# Patient Record
Sex: Male | Born: 1945 | Race: White | Hispanic: No | Marital: Married | Smoking: Former smoker
Health system: Southern US, Community
[De-identification: ages and names within clinical notes are randomized; demographics above are authoritative.]

## PROBLEM LIST (undated history)

## (undated) DIAGNOSIS — G4730 Sleep apnea, unspecified: Secondary | ICD-10-CM

## (undated) DIAGNOSIS — I10 Essential (primary) hypertension: Secondary | ICD-10-CM

## (undated) DIAGNOSIS — K7030 Alcoholic cirrhosis of liver without ascites: Secondary | ICD-10-CM

## (undated) DIAGNOSIS — Z923 Personal history of irradiation: Secondary | ICD-10-CM

## (undated) DIAGNOSIS — C159 Malignant neoplasm of esophagus, unspecified: Secondary | ICD-10-CM

## (undated) DIAGNOSIS — K760 Fatty (change of) liver, not elsewhere classified: Secondary | ICD-10-CM

## (undated) DIAGNOSIS — M1990 Unspecified osteoarthritis, unspecified site: Secondary | ICD-10-CM

## (undated) DIAGNOSIS — K219 Gastro-esophageal reflux disease without esophagitis: Secondary | ICD-10-CM

## (undated) DIAGNOSIS — Z9221 Personal history of antineoplastic chemotherapy: Secondary | ICD-10-CM

## (undated) DIAGNOSIS — IMO0001 Reserved for inherently not codable concepts without codable children: Secondary | ICD-10-CM

## (undated) DIAGNOSIS — M109 Gout, unspecified: Secondary | ICD-10-CM

## (undated) DIAGNOSIS — K429 Umbilical hernia without obstruction or gangrene: Secondary | ICD-10-CM

## (undated) HISTORY — PX: HERNIA REPAIR: SHX51

## (undated) HISTORY — PX: TONSILLECTOMY: SUR1361

---

## 2000-04-03 ENCOUNTER — Ambulatory Visit (HOSPITAL_COMMUNITY): Admission: RE | Admit: 2000-04-03 | Discharge: 2000-04-03 | Payer: Self-pay | Admitting: Gastroenterology

## 2000-04-16 DIAGNOSIS — C159 Malignant neoplasm of esophagus, unspecified: Secondary | ICD-10-CM

## 2000-04-16 DIAGNOSIS — Z923 Personal history of irradiation: Secondary | ICD-10-CM

## 2000-04-16 HISTORY — PX: ESOPHAGECTOMY: SUR457

## 2000-04-16 HISTORY — DX: Personal history of irradiation: Z92.3

## 2000-04-16 HISTORY — DX: Malignant neoplasm of esophagus, unspecified: C15.9

## 2000-10-15 ENCOUNTER — Encounter: Admission: RE | Admit: 2000-10-15 | Discharge: 2000-10-15 | Payer: Self-pay | Admitting: Family Medicine

## 2000-10-15 ENCOUNTER — Encounter: Payer: Self-pay | Admitting: Family Medicine

## 2000-10-16 ENCOUNTER — Encounter (INDEPENDENT_AMBULATORY_CARE_PROVIDER_SITE_OTHER): Payer: Self-pay | Admitting: *Deleted

## 2000-10-16 ENCOUNTER — Observation Stay (HOSPITAL_COMMUNITY): Admission: RE | Admit: 2000-10-16 | Discharge: 2000-10-17 | Payer: Self-pay | Admitting: Family Medicine

## 2000-10-16 ENCOUNTER — Encounter: Payer: Self-pay | Admitting: *Deleted

## 2000-10-17 ENCOUNTER — Encounter: Payer: Self-pay | Admitting: Hematology & Oncology

## 2000-10-21 ENCOUNTER — Encounter: Admission: RE | Admit: 2000-10-21 | Discharge: 2000-10-21 | Payer: Self-pay | Admitting: Hematology & Oncology

## 2000-10-21 ENCOUNTER — Encounter: Payer: Self-pay | Admitting: Hematology & Oncology

## 2000-10-22 ENCOUNTER — Ambulatory Visit: Admission: RE | Admit: 2000-10-22 | Discharge: 2000-11-13 | Payer: Self-pay | Admitting: Radiation Oncology

## 2000-10-23 ENCOUNTER — Ambulatory Visit (HOSPITAL_COMMUNITY): Admission: RE | Admit: 2000-10-23 | Discharge: 2000-10-23 | Payer: Self-pay | Admitting: Hematology & Oncology

## 2000-10-23 ENCOUNTER — Encounter: Payer: Self-pay | Admitting: Hematology & Oncology

## 2000-10-29 ENCOUNTER — Encounter: Payer: Self-pay | Admitting: Hematology & Oncology

## 2000-10-29 ENCOUNTER — Ambulatory Visit (HOSPITAL_COMMUNITY): Admission: RE | Admit: 2000-10-29 | Discharge: 2000-10-29 | Payer: Self-pay | Admitting: Hematology & Oncology

## 2000-11-14 ENCOUNTER — Ambulatory Visit: Admission: RE | Admit: 2000-11-14 | Discharge: 2001-02-12 | Payer: Self-pay | Admitting: Radiation Oncology

## 2000-12-09 ENCOUNTER — Ambulatory Visit: Admission: RE | Admit: 2000-12-09 | Discharge: 2000-12-09 | Payer: Self-pay | Admitting: Hematology & Oncology

## 2001-01-01 ENCOUNTER — Encounter: Payer: Self-pay | Admitting: Hematology & Oncology

## 2001-01-01 ENCOUNTER — Ambulatory Visit (HOSPITAL_COMMUNITY): Admission: RE | Admit: 2001-01-01 | Discharge: 2001-01-01 | Payer: Self-pay | Admitting: Hematology & Oncology

## 2001-01-02 ENCOUNTER — Ambulatory Visit (HOSPITAL_COMMUNITY): Admission: RE | Admit: 2001-01-02 | Discharge: 2001-01-02 | Payer: Self-pay | Admitting: Hematology & Oncology

## 2001-01-02 ENCOUNTER — Encounter: Payer: Self-pay | Admitting: Hematology & Oncology

## 2001-01-10 ENCOUNTER — Ambulatory Visit (HOSPITAL_COMMUNITY): Admission: RE | Admit: 2001-01-10 | Discharge: 2001-01-10 | Payer: Self-pay | Admitting: *Deleted

## 2001-01-10 ENCOUNTER — Encounter (INDEPENDENT_AMBULATORY_CARE_PROVIDER_SITE_OTHER): Payer: Self-pay | Admitting: *Deleted

## 2001-01-13 ENCOUNTER — Encounter: Payer: Self-pay | Admitting: Cardiothoracic Surgery

## 2001-01-15 ENCOUNTER — Encounter (INDEPENDENT_AMBULATORY_CARE_PROVIDER_SITE_OTHER): Payer: Self-pay | Admitting: Specialist

## 2001-01-15 ENCOUNTER — Inpatient Hospital Stay (HOSPITAL_COMMUNITY): Admission: RE | Admit: 2001-01-15 | Discharge: 2001-01-24 | Payer: Self-pay | Admitting: Cardiothoracic Surgery

## 2001-01-15 ENCOUNTER — Encounter: Payer: Self-pay | Admitting: Cardiothoracic Surgery

## 2001-01-16 ENCOUNTER — Encounter: Payer: Self-pay | Admitting: Cardiothoracic Surgery

## 2001-01-17 ENCOUNTER — Encounter: Payer: Self-pay | Admitting: Cardiothoracic Surgery

## 2001-01-18 ENCOUNTER — Encounter: Payer: Self-pay | Admitting: Cardiothoracic Surgery

## 2001-01-19 ENCOUNTER — Encounter: Payer: Self-pay | Admitting: Cardiothoracic Surgery

## 2001-01-20 ENCOUNTER — Encounter: Payer: Self-pay | Admitting: Cardiothoracic Surgery

## 2001-01-21 ENCOUNTER — Encounter: Payer: Self-pay | Admitting: Cardiothoracic Surgery

## 2001-01-22 ENCOUNTER — Encounter: Payer: Self-pay | Admitting: Cardiothoracic Surgery

## 2001-02-06 ENCOUNTER — Encounter: Admission: RE | Admit: 2001-02-06 | Discharge: 2001-02-06 | Payer: Self-pay | Admitting: Cardiothoracic Surgery

## 2001-02-06 ENCOUNTER — Encounter: Payer: Self-pay | Admitting: Cardiothoracic Surgery

## 2001-03-18 ENCOUNTER — Ambulatory Visit (HOSPITAL_COMMUNITY): Admission: RE | Admit: 2001-03-18 | Discharge: 2001-03-18 | Payer: Self-pay | Admitting: Hematology & Oncology

## 2001-03-18 ENCOUNTER — Encounter: Payer: Self-pay | Admitting: Hematology & Oncology

## 2001-04-24 ENCOUNTER — Encounter: Payer: Self-pay | Admitting: Cardiothoracic Surgery

## 2001-04-24 ENCOUNTER — Encounter: Admission: RE | Admit: 2001-04-24 | Discharge: 2001-04-24 | Payer: Self-pay | Admitting: Cardiothoracic Surgery

## 2001-04-28 ENCOUNTER — Encounter: Payer: Self-pay | Admitting: Hematology & Oncology

## 2001-04-28 ENCOUNTER — Ambulatory Visit (HOSPITAL_COMMUNITY): Admission: RE | Admit: 2001-04-28 | Discharge: 2001-04-28 | Payer: Self-pay | Admitting: Hematology & Oncology

## 2001-06-12 ENCOUNTER — Encounter: Payer: Self-pay | Admitting: Hematology & Oncology

## 2001-06-12 ENCOUNTER — Ambulatory Visit (HOSPITAL_COMMUNITY): Admission: RE | Admit: 2001-06-12 | Discharge: 2001-06-12 | Payer: Self-pay | Admitting: Hematology & Oncology

## 2001-07-24 ENCOUNTER — Encounter: Payer: Self-pay | Admitting: Cardiothoracic Surgery

## 2001-07-24 ENCOUNTER — Encounter: Admission: RE | Admit: 2001-07-24 | Discharge: 2001-07-24 | Payer: Self-pay | Admitting: Cardiothoracic Surgery

## 2001-08-05 ENCOUNTER — Ambulatory Visit (HOSPITAL_COMMUNITY): Admission: RE | Admit: 2001-08-05 | Discharge: 2001-08-05 | Payer: Self-pay | Admitting: Hematology & Oncology

## 2001-08-05 ENCOUNTER — Encounter: Payer: Self-pay | Admitting: Hematology & Oncology

## 2001-10-09 ENCOUNTER — Ambulatory Visit (HOSPITAL_COMMUNITY): Admission: RE | Admit: 2001-10-09 | Discharge: 2001-10-09 | Payer: Self-pay | Admitting: Hematology & Oncology

## 2001-10-09 ENCOUNTER — Encounter: Payer: Self-pay | Admitting: Hematology & Oncology

## 2002-01-01 ENCOUNTER — Encounter: Payer: Self-pay | Admitting: Cardiothoracic Surgery

## 2002-01-01 ENCOUNTER — Encounter: Admission: RE | Admit: 2002-01-01 | Discharge: 2002-01-01 | Payer: Self-pay | Admitting: Cardiothoracic Surgery

## 2002-02-16 ENCOUNTER — Encounter: Payer: Self-pay | Admitting: Hematology & Oncology

## 2002-02-16 ENCOUNTER — Ambulatory Visit (HOSPITAL_COMMUNITY): Admission: RE | Admit: 2002-02-16 | Discharge: 2002-02-16 | Payer: Self-pay | Admitting: Hematology & Oncology

## 2002-04-16 HISTORY — PX: INCISIONAL HERNIA REPAIR: SHX193

## 2002-06-24 ENCOUNTER — Ambulatory Visit (HOSPITAL_COMMUNITY): Admission: RE | Admit: 2002-06-24 | Discharge: 2002-06-24 | Payer: Self-pay | Admitting: Hematology & Oncology

## 2002-06-24 ENCOUNTER — Encounter: Payer: Self-pay | Admitting: Hematology & Oncology

## 2002-10-20 ENCOUNTER — Ambulatory Visit (HOSPITAL_COMMUNITY): Admission: RE | Admit: 2002-10-20 | Discharge: 2002-10-20 | Payer: Self-pay | Admitting: Hematology & Oncology

## 2002-10-20 ENCOUNTER — Encounter: Payer: Self-pay | Admitting: Hematology & Oncology

## 2002-11-05 ENCOUNTER — Encounter: Payer: Self-pay | Admitting: Hematology & Oncology

## 2002-11-05 ENCOUNTER — Ambulatory Visit (HOSPITAL_COMMUNITY): Admission: RE | Admit: 2002-11-05 | Discharge: 2002-11-05 | Payer: Self-pay | Admitting: Hematology & Oncology

## 2003-03-09 ENCOUNTER — Ambulatory Visit (HOSPITAL_COMMUNITY): Admission: RE | Admit: 2003-03-09 | Discharge: 2003-03-09 | Payer: Self-pay | Admitting: Hematology & Oncology

## 2003-07-20 ENCOUNTER — Ambulatory Visit (HOSPITAL_COMMUNITY): Admission: RE | Admit: 2003-07-20 | Discharge: 2003-07-20 | Payer: Self-pay | Admitting: Hematology & Oncology

## 2004-01-20 ENCOUNTER — Ambulatory Visit (HOSPITAL_COMMUNITY): Admission: RE | Admit: 2004-01-20 | Discharge: 2004-01-20 | Payer: Self-pay | Admitting: Hematology & Oncology

## 2004-05-17 ENCOUNTER — Ambulatory Visit: Payer: Self-pay | Admitting: Hematology & Oncology

## 2004-06-01 ENCOUNTER — Ambulatory Visit (HOSPITAL_COMMUNITY): Admission: RE | Admit: 2004-06-01 | Discharge: 2004-06-01 | Payer: Self-pay | Admitting: Hematology & Oncology

## 2004-08-02 ENCOUNTER — Ambulatory Visit: Payer: Self-pay | Admitting: Hematology & Oncology

## 2005-01-11 ENCOUNTER — Ambulatory Visit: Payer: Self-pay | Admitting: Hematology & Oncology

## 2005-01-15 ENCOUNTER — Ambulatory Visit (HOSPITAL_COMMUNITY): Admission: RE | Admit: 2005-01-15 | Discharge: 2005-01-15 | Payer: Self-pay | Admitting: Hematology & Oncology

## 2005-08-02 ENCOUNTER — Ambulatory Visit: Payer: Self-pay | Admitting: Hematology & Oncology

## 2005-08-03 LAB — CBC WITH DIFFERENTIAL/PLATELET
BASO%: 0.6 % (ref 0.0–2.0)
EOS%: 3 % (ref 0.0–7.0)
HGB: 11.9 g/dL — ABNORMAL LOW (ref 13.0–17.1)
LYMPH%: 13.5 % — ABNORMAL LOW (ref 14.0–48.0)
MCH: 34.1 pg — ABNORMAL HIGH (ref 28.0–33.4)
MCHC: 34.8 g/dL (ref 32.0–35.9)
MCV: 98.1 fL — ABNORMAL HIGH (ref 81.6–98.0)
MONO%: 11.7 % (ref 0.0–13.0)
RBC: 3.49 10*6/uL — ABNORMAL LOW (ref 4.20–5.71)
RDW: 13.4 % (ref 11.2–14.6)
WBC: 3.2 10*3/uL — ABNORMAL LOW (ref 4.0–10.0)

## 2005-08-03 LAB — COMPREHENSIVE METABOLIC PANEL
AST: 77 U/L — ABNORMAL HIGH (ref 0–37)
Albumin: 4.5 g/dL (ref 3.5–5.2)
Alkaline Phosphatase: 79 U/L (ref 39–117)
Potassium: 5.1 mEq/L (ref 3.5–5.3)
Sodium: 135 mEq/L (ref 135–145)
Total Bilirubin: 0.6 mg/dL (ref 0.3–1.2)
Total Protein: 6.9 g/dL (ref 6.0–8.3)

## 2005-08-06 ENCOUNTER — Ambulatory Visit (HOSPITAL_COMMUNITY): Admission: RE | Admit: 2005-08-06 | Discharge: 2005-08-06 | Payer: Self-pay | Admitting: Hematology & Oncology

## 2006-10-28 ENCOUNTER — Ambulatory Visit: Payer: Self-pay | Admitting: Vascular Surgery

## 2007-01-31 ENCOUNTER — Ambulatory Visit (HOSPITAL_COMMUNITY): Admission: RE | Admit: 2007-01-31 | Discharge: 2007-01-31 | Payer: Self-pay | Admitting: Surgery

## 2007-06-03 ENCOUNTER — Encounter: Admission: RE | Admit: 2007-06-03 | Discharge: 2007-06-03 | Payer: Self-pay | Admitting: Family Medicine

## 2009-11-10 ENCOUNTER — Encounter: Admission: RE | Admit: 2009-11-10 | Discharge: 2009-11-10 | Payer: Self-pay | Admitting: Family Medicine

## 2010-08-29 NOTE — Assessment & Plan Note (Signed)
OFFICE VISIT   [name], [name]  DOB:  03/13/1946                                       10/28/2006  ONGEX#:[id number]   The patient presents today for discussion of his infrarenal abdominal  aortic aneurysm.  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  He has done extremely well since his resection.  He had, on  followup CAT scan, noted to have a 4 cm aneurysm and I am seeing him for  discussion of this and for ongoing followup.  He has no symptoms  referable to his aneurysm.   PAST MEDICAL HISTORY:  Significant for esophageal cancer, as discussed.  He is not diabetic.  He is hypertensive.  He is married with 2 children,  quit smoking 12 years ago, has 2 glasses of wine per day.  Only other  review of systems positive for arthritis.  He has no allergies to  medications.   MEDICATIONS:  Norvasc.  Atenolol.  Lisinopril.  Hydrochlorothiazide.  Aspirin.   PHYSICAL EXAM:  Well-developed, well-nourished white male white male  appearing stated age of 33.  Blood pressure is 124/82, pulse 67,  respirations 16.  His radial pulses are 2+.  He has 2+ femoral, 2+  popliteal, and 2+  posterior tibial pulses with no evidence of  peripheral aneurysms.  ABDOMEN:  No tenderness.  I do not feel his  aneurysm.  He does have a ventral incisional hernia, which is easily  reducible.   He underwent ultrasound today revealing no change.  The maximal diameter  of his aneurysm is 4 cm.  I discussed this at length with the patient.  Discussed the symptoms of leaking aneurysm with him.  He knows to  present immediately to [facility] ER should this occur.  Otherwise, we  plan to see him again in 1 year with serial ultrasound studies.  He  reports that he had seen a general surgeon in the past and does not  recall who he saw, and this is not in his current chart.  He reports  that he is  having more difficulty with his ventral incisional hernia  with a sensation of it popping in and out.  I have scheduled an  appointment for him to see Dr. Darnell Level for evaluation and treatment.   Larina Earthly, M.D.  Electronically Signed   TFE/MEDQ  D:  10/28/2006  T:  10/29/2006  Job:  [id number]   cc:   Sheliah Plane, MD  Rose Phi. Myna Hidalgo, M.D.  Velora Heckler, MD

## 2010-08-29 NOTE — Procedures (Signed)
DUPLEX ULTRASOUND OF ABDOMINAL AORTA   INDICATION:  Followup of abdominal aortic aneurysm - CT.   HISTORY:  Diabetes:  No  Cardiac:  No  Hypertension:  Yes.  Smoking:  Quit 12 years ago.  Connective Tissue Disorder:  Family History:  Previous Surgery:   DUPLEX EXAM:         AP (cm)                   TRANSVERSE (cm)  Proximal             1.96 cm.                  Is 2.11 cm.  Mid                  3.54 cm.                  Is 4.07 cm.  Distal               1.7 cm.                   Is 1.85 cm.  Right Iliac          not well visualized       Not well visualized  Left Iliac           not well visualized       not well visualized   PREVIOUS:  Date:  AP:  3.33.  TRANSVERSE:  4.00.   IMPRESSION:  Abdominal aortic aneurysm noted with the largest  measurement of (3.54 cm x4.07 cm).   Dictated for Larina Earthly, M.D.   ___________________________________________  Larina Earthly, M.D.   MG/MEDQ  D:  10/29/2006  T:  10/30/2006  Job:  [id number]

## 2010-08-29 NOTE — Op Note (Signed)
NAMEJOSUHA, [name]              ACCOUNT NO.:  [id number]   MEDICAL RECORD NO.:  [id number]          PATIENT TYPE:  AMB   LOCATION:  DAY                          FACILITY:  [facility]   PHYSICIAN:  Thornton Park. Daphine Deutscher, MD  DATE OF BIRTH:  1945/06/19   DATE OF PROCEDURE:  01/31/2007  DATE OF DISCHARGE:  01/31/2007                               OPERATIVE REPORT   PREOPERATIVE DIAGNOSIS:  Lower portion of upper midline abdominal  incision with a symptomatic ventral hernia.   POSTOPERATIVE DIAGNOSIS:  Sliding ventral hernia at site of previous  laparotomy for transhiatal total esophagectomy for squamous cell  carcinoma.   PROCEDURE:  Open takedown of sliding hernia, repair anteriorly primarily  with Ultrapro mesh.   SURGEON:  Thornton Park. Daphine Deutscher, M.D.   ANESTHESIA:  General endotracheal.   DESCRIPTION OF PROCEDURE:  [name] was taken to room 11 on  January 31, 2007 and given general anesthesia.  The abdomen was prepped  with Techni-Care and draped sterilely.   I had previously marked the site of the incision and excised the old  scar and carried this down and found about a quarter-size defect at the  lowermost portion of the wound.  I removed some old knotted suture from  apparent running closure of Prolene.  Having done that, I identified the  sac and freed it down to the fascia but noticed it appeared more like  bowel.  I then carefully went off to the side of that and found an open  area and entered into a sac and found that in fact the hernia was in  fact a slider hernia with one wall of this being small bowel.  That is  why he noticed often times when he would eat, it would stick out.  I  very carefully mobilized this and then ended up making the incision  bigger and found a second defect about an inch above this and made all  of these one continuous wound.  There was essentially no tension to  close it primarily, but I went ahead and elevated flaps medially and  laterally  and superiorly and inferiorly.  I then went inside and took  down adhesions, taking care to avoid any enterotomies, and freed the  area in its entirety.  I elected to go ahead and close this primarily  with mesh.  I could also feel laterally on the left side where he had a  previous feeding jejunostomy.   The wound was then closed first by placing some horizontal mattress  laterally of #1 PDS subsequently to tack through the mesh laterally and  take some pressure off this, but I then placed 0 Prolene pop-off  sutures, seven of them in total, and then threaded these up through the  mesh and tied the mesh down.  After the wound was closed, through the  buttress of the Ultrapro mesh, I then tacked it laterally with some  horizontal mattress sutures of 0 Prolene in addition to the #1 PDS.  It  was a full-thickness lateral anchor.  I put my finger in several times  and  did not impinge on any small bowel.  I infiltrated the fascia with  Marcaine without epinephrine, 0.5%, and then closed with 4-0 Vicryl  subcutaneously and subcuticularly as well as put in some staples.   The patient tolerated the procedure well.  An abdominal binder was  placed.  He was given Tylox for pain, and I will see him back within a  week for suture removal.      Molli Hazard B. Daphine Deutscher, MD  Electronically Signed     MBM/MEDQ  D:  01/31/2007  T:  02/02/2007  Job:  [id number]   cc:   Jethro Bastos, M.D.  Fax: [phone]   Sheliah Plane, MD  [address]  [place]  [place] [address]   Larina Earthly, M.D.  [address]  [place]  [place] [address]

## 2010-09-01 NOTE — Op Note (Signed)
[facility]  Patient:    [name], [name] Visit Number: [id number] MRN: [id number]          Service Type: SUR Location: [id number] Attending Physician:  Waldo Laine Dictated by:   Gwenith Daily Tyrone Sage, M.D. Proc. Date: 01/15/01 Admit Date:  01/15/2001   CC:         Rose Phi. Myna Hidalgo, M.D.  Roosvelt Harps, M.D.   Operative Report  PREOPERATIVE DIAGNOSIS:  Squamous cell carcinoma of the midesophagus.  POSTOPERATIVE DIAGNOSIS:  Squamous cell carcinoma of the midesophagus.  OPERATION PERFORMED:  Bronchoscopy, transhiatal total esophagectomy for cervicoesophagogastrostomy, pyloroplasty, feeding jejunostomy.  SURGEON:  Gwenith Daily. Tyrone Sage, M.D.  FIRST ASSISTANT: 1. Clarence H. Cornelius Moras, M.D. 2. D. Karle Plumber, M.D.  SECOND ASSISTANT:  Dominica Severin, P.A.  ANESTHESIA:  General.  INDICATIONS FOR PROCEDURE:  The patient is a 65 year old male who presented in July of this year with difficulty swallowing.  At that time, evaluation for the endoscopy, barium swallow and CT scan confirmed a large apple core type lesion between the upper and the mid third of the esophagus, biopsy proven to be squamous cell carcinoma.  The patient had associated significant mediastinal mass related to this.  The patient initially was treated with radiation and chemotherapy which he tolerated well.  Follow-up scans and endoscopy showed marked improvement with almost complete disappearance of the mediastinal mass.  In addition, repeat biopsies did not show evidence of tumor.  Because the patient was referred for surgical resection, the risks and options were discussed with the patient, particularly the risk of anastomotic leak.  He was agreeable and signed informed consent.  DESCRIPTION OF PROCEDURE:  The patient underwent general endotracheal anesthesia.  Through the single lumen endotracheal tube, a fiberoptic bronchoscope was passed and the  tracheobronchial tree was carefully examined, including withdrawing the tube out and looking at the entire trachea.  There was no evidence of tracheal invasion by tumor by bronchoscopy.  The scope was removed.  Because of the possibility of tracheal involvement with the location of the tumor, a double lumen endotracheal tube was placed by anesthesia; however, they had significant difficulty in doing this, taking 45 to 50 minutes.   Finally, a tube was placed satisfactorily.  The patient was positioned so a simultaneous left neck right chest abdominal incisions could be made.  The patient was prepped and draped.  Initially a laparotomy was performed.  The abdomen was explored without evidence of metastatic disease. The liver was normal.  The left lobe of the liver was taken down, retracted laterally.  Upper hand retractor provided exposure to the upper abdomen.  The short gastric arteries were ligated and divided freeing the fundus of the stomach.  The lesser sac was entered and the coronary vein and left gastric artery were identified.  The right gastroepiploic vessels were preserved.  The coronary vein was divided.  With the bulldog on the left gastric artery there was still easily palpable pulse in the right gastroepiploica and in the splenic arteries.  The hiatus was opened to allow direct resection of the majority of the esophagus from the abdomen.  A transhiatal approach was undertaken, taking special care in the upper portion of the esophagus; however, as the dissection proceeded, it became obvious that the esophagus was not adherent to or invading the trachea and easily was dissected free with this determined, a neck incision was created retracting along the left sternocleidomastoid muscle.  The muscle, jugular vein and  carotid were retracted laterally.  The cervical esophagus was dissected out, encircled with a Penrose drain.  No metal retractors were placed into the depths of the  neck incision to avoid any possibility of recurrent nerve injury.  This dissection continued freeing the esophagus.  The cervical esophagus was then divided with a GIA stapler and the specimen removed into the abdomen.  The lesser curve distal resection was then carried out with a 55 GIA stapler with serial firings across the gastroesophageal junction. This stomach staple line was then oversewed with a running 3-0 Prolene suture.  The specimen was submitted for frozen section.  Both the proximal and distal margins were free of tumor. A standard Mikulicz pyloroplasty was then performed with interrupted 4-0 Vicryls closing the inner layer and 3-0 silk sutures closing the outer layer. Small clips were placed at the pylorus to radiographically mark the area.  A Kocher maneuver had been performed to further free up the stomach.  The left gastric artery was divided and suture ligated and the remainder of the lesser curve was dissected free to allow complete mobilization of the stomach.  With the blood supply based on the right gastroepiploic, the stomach appeared viable.  It was then passed through the posterior mediastinum into the neck with the help of a telescope ____________ .  The anastomosis of the cervical esophagus to the stomach was then carried out with a 30 mm 3.5 mm staples, Autosuture stapler.  The free edge of the esophagus was then re-resected.  An opening was made in the stomach 180 degrees from the previous stable line and a mucosa to mucosal staple line was created with the Autosuture stapler.  The remaining opening of the esophagus was closed with interrupted 4-0 Vicryl sutures and 3-0 silk sutures as a second layer.  Prior to complete closure, the anastomosis and G-tube had been placed down into the stomach.  The Penrose drain was placed in the depths of the cervical incision and brought out through a separate stab wound in the neck.  The esophageal hiatus was loosely  tacked  to the stomach to prevent a herniation of abdominal contents into the chest.  There was no obvious violation of either pleural space.  The proximal jejunum was identified and using Witzel technique with 4-0 silk sutures, the 18 French red rubber catheter was placed into the proximal jejunum and brought out through a separate stab wound in the abdominal wall to serve as a jejunal feeding tube.  With the sponge and needle count reported as correct, the abdominal incision was closed with a #1 Prolene suture running through the fascia, a running 2-0 Vicryl in the subcutaneous tissues and skin staples to the skin edges.  The neck was closed in layers with interrupted 3-0 Vicryl pop-offs and skin edges reapproximated with skin staple.  Dry dressings were applied.  The sponge and needle counts were reported as correct at the completion of the procedure.  The patient had estimated blood loss of approximately 300 cc.  Tolerated the procedure without obvious complication. The patients endotracheal tube was changed from a double lumen to a single lumen tube and he was then transferred to surgical intensive care unit for further postoperative care. Dictated by:   Gwenith Daily Tyrone Sage, M.D. Attending Physician:  Waldo Laine DD:  01/16/01 TD:  01/16/01 Job: [id number]

## 2010-09-01 NOTE — H&P (Signed)
[name]. [facility]  Patient:    [name], [name] Visit Number: [id number] MRN: [id number]          Service Type: Attending:  Gwenith Daily. Tyrone Sage, M.D. Dictated by:   Durenda Age, P.A.-C. Adm. Date:  01/15/01   CC:         Jethro Bastos, M.D.  Billie Lade, M.D.  Roosvelt Harps, M.D.   History and Physical  DATE OF BIRTH:  February 07, 1946  CHIEF COMPLAINT:  Esophageal cancer.  HISTORY OF PRESENT ILLNESS:  [name] is a pleasant 65 year old white male referred by Dr. Rose Phi. Ennever for evaluation of esophageal cancer.  He originally was seen in July 2002, with a four- to six-week history of dysphagia and indigestion.  The patient had presented to his primary care physician, with the same chief complaints.  He underwent barium swallow, which showed marked stricture in the midesophagus.  EGD resulted in good dilatation of the esophagus, and the cells examined, revealed squamous cell carcinoma.  A CT demonstrated large posterior mediastinal mass, at and above the level of the carina and extending beyond the esophageal lumen.  No metastasis in the abdomen, pelvis or brain were seen.  At that time, he was referred with stage III squamous cell carcinoma requiring radiation and chemotherapy for about four weeks, which the patient tolerated well.  He then was evaluated by Dr. Ramon Dredge B. Tyrone Sage, who recommends to undergo right thoracotomy, and total esophagectomy with possible cervical anastomosis, planned for January 15, 2001.  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  SURGERIES:  Status post colonoscopy x 2 with removal of benign polyps, status post skin graft of the right lower extremity secondary to rugby injury, remote.  MEDICATIONS: 1. Prinzide 20/12.5 mg q.d. 2. Aspirin q.d. 3. Norvasc 5 mg q.d.  ALLERGIES:  No known drug  allergies.  REVIEW OF SYSTEMS:  See HPI and past medical history for significant positives.  No diabetes, kidney disease or asthma.  No cardiac disease.  He states that his dysphagia has improved since dilatation.  No shortness of breath.  He has a positive weight loss of about 10 pounds, since the radiation time.  FAMILY HISTORY:  Mother alive at 61 years old, with osteoarthritis.  Father died of diabetes complications/emphysema.  One sister alive with osteoarthritis.  Other brother alive (twin), in good state of health.  His family history is negative for cancer of the esophagus.  SOCIAL HISTORY:  Married.  Two children.  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  He quit four to five years ago the use of cigarettes.  He drinks two to three glasses of wine a day.  PHYSICAL EXAMINATION:  GENERAL:  Well-developed, well-nourished 65 year old white male in no acute distress, alert and oriented x 3.  VITAL SIGNS:  Blood pressure 110/68, pulse 78, respirations 18.  HEENT:  Normocephalic, atraumatic.  PERRLA.  EOMI.  Funduscopic exam within normal limits.  NECK:  Supple.  No JVD, bruits or lymphadenopathy.  CHEST:  Symmetrical on inspiration.  Lungs clear to auscultation.  CARDIOVASCULAR:  Regular rate and rhythm.  No murmurs, rubs, or gallops.  ABDOMEN:  Soft, nontender.  Bowel sounds x 4.  There is a pulsatile mass consistent with his aneurysm, but no bruits are audible.  GU AND RECTAL:  Deferred.  EXTREMITIES:  No clubbing, cyanosis, or edema.  No edema.  There is hyperpigmentation of the skin secondary to radiation.  No ulcerations.  Warm temperature.  PERIPHERAL PULSES:  Carotids, femorals, popliteal, dorsalis pedis and posterior tibialis 2+ bilaterally.  NEUROLOGIC:  Nonfocal.  Gait steady.  DTRs 2+ bilaterally.  Muscle strength 5/5.  ASSESSMENT AND PLAN:  Esophageal cancer, for total esophagectomy with right thoracotomy by Dr. Tyrone Sage on January 15, 2001.  Dr.  Tyrone Sage has seen and evaluated this patient prior to the admission and has explained the risks and benefits involved in the procedure and the patient has agreed to continue. Dictated by:   Durenda Age, P.A.-C. Attending:  Gwenith Daily Tyrone Sage, M.D. DD:  01/13/01 TD:  01/13/01 Job: [id number]

## 2010-09-01 NOTE — Consult Note (Signed)
[place]. [facility]  Patient:    [name], [name] Visit Number: [id number] MRN: [id number]          Service Type: SUR Location: 2300 2301 01 Attending Physician:  Waldo Laine Dictated by:   Rose Phi. Myna Hidalgo, M.D. Proc. Date: 01/17/01 Admit Date:  01/15/2001   CC:         Ramon Dredge B. Tyrone Sage, M.D./Robert Kristeen Mans, M.D.  Roosvelt Harps, M.D./James Carley Hammed, M.D.   Consultation Report  REFERRING PHYSICIAN: Gwenith Daily. Tyrone Sage, M.D.  REASON FOR CONSULTATION: Squamous cell carcinoma of the esophagus, stage unknown.  HISTORY OF PRESENT ILLNESS: [name] is a very nice 65 year old white gentleman, who I initially saw on October 16, 2000.  At that time he presented with dysphagia.  He was seen by Dr. Luther Parody.  He underwent endoscopy, which showed an esophageal mass.  This was 24-30 cm from the incisors.  Biopsies were obtained and found to be squamous cell carcinoma.  CT scan showed a large posterior mediastinal mass.  There was no evidence of metastatic disease.  The tumor area was positive on PET scan.  He subsequently underwent neoadjuvant therapy with chemotherapy and radiation therapy.  He tolerated this incredibly well.  He received two cycles of cisplatin with 96 hour infusion of 5-FU.  Post neoadjuvant therapy he underwent CT scan.  There was no evidence of hilar or mediastinal adenopathy.  The posterior mediastinal mass had resolved. No pulmonary nodules were noted.  There was no evidence of disease below the diaphragm.  The liver, adrenals, and kidneys all looked within normal limits. 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  He had no adenopathy below the diaphragm.  A PET scan was done, which showed no activity in the mediastinum. He subsequently was re-endoscoped by Dr. Luther Parody.  Dr. Luther Parody did biopsies and the pathology report ([id number]) showed findings consistent with gastroesophageal reflux.  No obvious  malignancy was noted on multiple biopsies.  He was then taken to surgery by Dr. Ofilia Neas.  Surgery was done on January 15, 2001.  At the time of surgery there was no obvious mass noted upon looking with the endoscope.  [name] was subsequently able to undergo a transhiatal approach.  He had transhiatal total esophagectomy.  He underwent pyloroplasty and a feeding jejunostomy.  Again, no obvious malignancy was noted.  Lymph nodes all appeared to be unremarkable.  The pathology report is still pending.  Postoperatively, [name] has done very well.  He has had very little complications.  He is on a PCA morphine pump for his pain control.  During his neoadjuvant therapy he lost very little in the way of weight.  PAST MEDICAL HISTORY: Remarkable for hypertension.  ALLERGIES: None.  MEDICATIONS:  1. Zestoretic 20/12.5 mg one p.o. q.d.  2. Norvasc 5 mg p.o. q.d.  3. Baby aspirin 81 mg p.o. q.d.  SOCIAL HISTORY: He did have a history of tobacco use.  He did have some social alcohol use.  He did quit smoking 5-1/2 years ago.  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  FAMILY HISTORY: Noncontributory.  There is no malignancy in the family.  REVIEW OF SYSTEMS: As stated in the History of Present Illness.  In addition, there are no fevers, sweats, or chills.  There is no bleeding or bruising.  He has had no hemoptysis.  There has been no diarrhea or melena.  He has had no headaches.  There have been no  rashes.  He has had no chest wall pain.  PHYSICAL EXAMINATION:  GENERAL: Well-developed, well-nourished white gentleman, in no obvious distress.  He does have an NG tube in place.  He does have a feeding tube in place.  VITAL SIGNS: TEMP 100.4 degrees, pulse 114, respiratory rate 16, blood pressure 132/70.  HEENT: Normocephalic, atraumatic skull.  He does have an NG tube into the left nostril.  He does not have any adenopathy in the neck.  There are no  oral lesions.  LUNGS: Some decreased breath sounds over in the left lung field, right lung field clear.  CARDIAC: Tachycardic but regular.  There are no murmurs, rubs, or bruits.  ABDOMEN: Soft.  Bowel sounds absent.  Some slight tenderness to palpation in the upper abdominal area secondary to the feeding tube.  No hepatosplenomegaly noted.  EXTREMITIES: No clubbing, cyanosis, or edema.  Good range of motion of joints.  SKIN: No rashes, ecchymosis, or petechiae.  NEUROLOGIC: No focal neurological deficits.  His mental status is intact.  LABORATORY DATA: Studies this morning show a WBC of 9.4, hemoglobin 9.7, hematocrit 27.5, platelet count 200,00.  Sodium 132, potassium 3.6, BUN 5, creatinine 0.7.  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  He underwent neoadjuvant chemo-radiation therapy with remarkable results.  We do not have the pathology report for his esophagectomy but on repeat endoscopy no malignancy was identified.  I hope, and I believe, that we have been able to downstage [name] significantly.  In light of this, I believe that he will hopefully have at worst stage 2 disease.  If we are extremely lucky, then we will find that he has no areas of active malignancy outside of possibly microscopic foci. According to Dr. Tyrone Sage, his proximal and distal margins were negative.  RECOMMENDATIONS: I do not see a role for any adjuvant therapy for [name]. He has pretty much received maximal therapy up to now.  There has been no proven benefit for adjuvant chemotherapy for esophageal cancer.  Since he has no evidence of metastatic disease, there is no role for chemotherapy.  We will just have to await the final pathology report.  This will really give [place] an idea as to the long-term outlook for [name].  It will not, however, have an impact upon whether he receives adjuvant therapy or  not.   Thank you for letting me know of [name] admission and subsequent surgery at [facility]. [facility].  We will await the results of the pathology report.Dictated by:   Rose Phi. Myna Hidalgo, M.D. Attending Physician:  Waldo Laine DD:  01/17/01 TD:  01/17/01 Job: [id number]

## 2010-09-01 NOTE — Discharge Summary (Signed)
[place]. [facility]  Patient:    ARLINGTON, SIGMUND Visit Number: [id number] MRN: [id number]          Service Type: SUR Location: 2000 2010 01 Attending Physician:  Waldo Laine Dictated by:   Adair Patter, P.A. Admit Date:  01/15/2001 Disc. Date: 01/24/01   CC:         Rose Phi. Myna Hidalgo, M.D.  Jethro Bastos, M.D.  Roosvelt Harps, M.D.   Discharge Summary  CC:  Billie Lade, M.D.  DATE OF BIRTH:  March 08, 1946  ADMISSION DIAGNOSIS:  Esophageal cancer.  SECONDARY DIAGNOSIS:  Hypertension.  DISCHARGE DIAGNOSIS:  Esophageal cancer.  HOSPITAL PROCEDURES: 1. Fiberoptic bronchoscopy. 2. Total esophagectomy with pyloroplasty. 3. Feeding jejunostomy. 4. Gastrografin swallowing study. 5. Barium swallowing study.  HOSPITAL COURSE:  Mr. Brailsford is a patient who was referred to Dr. Tyrone Sage secondary to squamous cell carcinoma of his esophagus.  Because of this Dr. Tyrone Sage recommended patient be admitted to the hospital for surgery.  On January 15, 2001 Mr. Marsala underwent a fiberoptic bronchoscopy, total esophagectomy with pyloroplasty and insertion of feeding jejunostomy tube by r. Tyrone Sage.  No complications were noted during the procedure. Postoperatively patient had a relatively uneventful hospital course.  Once his bowel function resumed he was started on tube feeds, which he tolerated well. After several days patient underwent a gastrografin swallowing study, which revealed no leak at his proximal anastomosis.  In light of those results patient underwent a barium swallow, which confirmed no leak at his proximal anastomosis.  Because of this patients diet was advanced as tolerated, which he tolerated well.  Patient also had a Penrose drain placed at his neck incision, which was advanced appropriately and discontinued before discharge.  The patient was subsequently discharged home in stable and satisfactory condition on  January 24, 2001.  MEDICATIONS AT THE TIME OF DISCHARGE: 1. Tylox 1-2 tablets every 4-6 hours as needed for pain. 2. Tylenol 325 mg 1-2 tablets every 4-6 hours as needed for pain.  DISCHARGE ACTIVITY:  Patient told to avoid driving, strenuous activity and lifting heavy objects.  He was told to walk daily and to continue to use incentive spirometer.  DISCHARGE DIET:  Patient was instructed to continue with small frequent meals of soft food caliber.  WOUND CARE:  Patient was told that he could shower and clean his incision with soap and water.  He was told to notify Dr. Ysidro Evert office if he developed any high fevers or noticed any drainage from any of his incisions.  In addition, patient was told flush his feeding tube with 30 cc of sterile water twice daily.  He will be instructed by nursing staff at [facility] how to do this before discharge.  DISPOSITION:  Home.  FOLLOW-UP:  Patient will follow up with Dr. Tyrone Sage at the [facility] office on Thursday, October 24th at 11:30 AM.  He was told to go to the [facility] one hour before his appointment with Dr. Tyrone Sage for chest x-ray.  Oncology consultation was obtained while the patient in hospital; however, they felt there was no role for adjuvant chemotherapy at this time. Dictated by:   Adair Patter, P.A. Attending Physician:  Waldo Laine DD:  01/23/01 TD:  01/23/01 Job: [id number]

## 2010-09-01 NOTE — Procedures (Signed)
[facility]  Patient:    [name], [name]                           MRN: [id number] Proc. Date: 04/03/00 Adm. Date:  [id number] Attending:  Orland Mustard CC:         Jethro Bastos, M.D.   Procedure Report  DATE OF BIRTH:  02/01/1946  PROCEDURE:  Colonoscopy.  MEDICATIONS:  Fentanyl 100 mcg, Versed 8 mg IV.  INDICATIONS:  History of previous adenomatous polyps.  DESCRIPTION OF PROCEDURE:  The procedure had been explained to the patient and consent obtained.  With the patient in the left lateral decubitus, the adult Olympus video colonoscope was inserted and advanced under direct visualization.  The prep was excellent.  We were able to reach the cecum without difficulty.  The right lower quadrant was transilluminated with the ileocecal valve and appendiceal orifice seen.  The scope was withdrawn.  The cecum and ascending colon, hepatic flexure, transverse colon, splenic flexure and descending colon and sigmoid colon were seen well upon removal.  No significant diverticula disease.  No polyps seen throughout the colon. The scope was withdrawn and the patient tolerated the procedure well and was maintained on low-flow oxygen and pulse oximeter throughout the procedure with no obvious problem.  ASSESSMENT:  No evidence of further polyps.  PLAN:  Will recommend repeating in three years. DD:  04/03/00 TD:  04/04/00 Job: [id number]

## 2010-09-01 NOTE — H&P (Signed)
[place]. [facility]  Patient:    [name], [name]                   MRN: [id number] Adm. Date:  [id number] Attending:  Thane Edu CC:         Jethro Bastos, M.D.  Rose Phi. Myna Hidalgo, M.D.  Denman George, M.D.   History and Physical  HISTORY OF PRESENT ILLNESS:  Mr. Seda is a 65 year old male, who I was asked to see by Dr. Dorothe Pea.  He presented with dysphagia which had been progressively worsening over the last three weeks.  However, according to his wife, he has actually been ignoring symptoms for approximately two months.  He underwent a barium swallow yesterday that demonstrated an apple core lesion of the mid esophagus, and today I was asked to endoscope him as soon as possible for definitive diagnosis.  He denies dysphagia to liquids.  He is mostly having problems with bread and occasionally meat.  He is a former smoker, having quit seven years ago.  He drinks two glasses of wine per day.  He has never had any significant problem with reflux.  He has not been losing significant amounts of weight but has dropped 10 pounds in the past year. 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  The endoscope passed with some difficulty.  Multiple biopsies were done, and a 12 mm Savary dilator was passed with mild resistance.  PAST MEDICAL HISTORY: 1. Hypertension. 2. Minor orthopedic problems.  CURRENT MEDICATIONS:  Prinzide and another blood pressure medication, unknown dosages.  ALLERGIES:  None reported.  FAMILY HISTORY:  Negative for ulcers, gallstones, inflammatory bowel disease, or gastrointestinal neoplasia.  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  As noted, he is a former smoker and drinks two glasses of wine daily.  REVIEW OF SYSTEMS:  GENERAL:  Minimal weight loss.  No night sweats. ENDOCRINE:  No history of diabetes or thyroid problems.   SKIN:  Chronic sore of the right ankle area after a rugby injury which required a skin graft. EYES:  No icterus or change in vision.  ENT:  No aphthous ulcers or chronic sore throat.  RESPIRATORY:  No shortness of breath, cough, or wheezing. CARDIAC:  No chest pain, palpitations, or history of valvular heart disease. GI:  As above.  GU:  No dysuria or hematuria.  The remainder of the review of systems is negative.  PHYSICAL EXAMINATION:  GENERAL:  He is a well-developed, well-nourished adult male in no acute distress.  VITAL SIGNS:  Afebrile with a blood pressure of 140/80, pulse is 80 and regular.  SKIN:  Normal with the exception of the above-mentioned chronic sore of the right ankle.  HEENT:  Eyes are anicteric.  Oropharynx unremarkable.  NECK:  Supple without thyromegaly.  There is no cervical or inguinal adenopathy.  CHEST:  Sounds clear.  HEART:  Regular rate and rhythm.  ABDOMEN:  Benign without mass, tenderness, or organomegaly.  There is no rebound or hernia.  Bowel sounds are normal.  RECTAL:  Not performed.  EXTREMITIES:  Without clubbing, cyanosis, edema, or rash.  Dorsalis pedis pulses 2+ bilaterally.  IMPRESSION:  Unfortunate 65 year old male with probable squamous carcinoma of the mid esophagus.  This is a fairly long lesion of 6 cm and partially obstructing already.  PLAN:  He will be admitted to the hospital for a CT scan, surgical  and oncologic consultations.  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 DD:  10/16/00 TD:  10/16/00 Job: [id number]

## 2011-01-24 LAB — BASIC METABOLIC PANEL
BUN: 23
CO2: 24
Calcium: 9.5
GFR calc non Af Amer: 26 — ABNORMAL LOW
Glucose, Bld: 120 — ABNORMAL HIGH

## 2011-07-16 ENCOUNTER — Other Ambulatory Visit: Payer: Self-pay | Admitting: Dermatology

## 2012-05-15 DIAGNOSIS — J019 Acute sinusitis, unspecified: Secondary | ICD-10-CM | POA: Insufficient documentation

## 2012-05-16 DIAGNOSIS — R9389 Abnormal findings on diagnostic imaging of other specified body structures: Secondary | ICD-10-CM | POA: Insufficient documentation

## 2012-05-16 DIAGNOSIS — G8929 Other chronic pain: Secondary | ICD-10-CM | POA: Insufficient documentation

## 2012-06-11 ENCOUNTER — Other Ambulatory Visit: Payer: Self-pay | Admitting: Gastroenterology

## 2013-03-05 ENCOUNTER — Ambulatory Visit (INDEPENDENT_AMBULATORY_CARE_PROVIDER_SITE_OTHER): Payer: Medicare Other | Admitting: Podiatry

## 2013-03-05 ENCOUNTER — Encounter: Payer: Self-pay | Admitting: Podiatry

## 2013-03-05 VITALS — BP 158/90 | HR 83 | Resp 12 | Ht 74.0 in | Wt 212.0 lb

## 2013-03-05 DIAGNOSIS — M79609 Pain in unspecified limb: Secondary | ICD-10-CM

## 2013-03-05 DIAGNOSIS — B351 Tinea unguium: Secondary | ICD-10-CM

## 2013-03-05 NOTE — Progress Notes (Signed)
Subjective:     Patient ID: [name], male   DOB: Dec 28, 1945, 67 y.o.   MRN: [id number]  HPI patient states my nails are bothering me and I cannot cut them because they're so thick   Review of Systems     Objective:   Physical Exam    neurovascular status unchanged and nail disease with thickness 1-5 both feet Assessment:     Mycotic nail infection with pain 1-5 both feet    Plan:     Debridement of painful nailbed 1-5 both feet

## 2013-06-04 ENCOUNTER — Ambulatory Visit (INDEPENDENT_AMBULATORY_CARE_PROVIDER_SITE_OTHER): Payer: Medicare Other | Admitting: Podiatry

## 2013-06-04 ENCOUNTER — Encounter: Payer: Self-pay | Admitting: Podiatry

## 2013-06-04 VITALS — BP 169/90 | HR 83 | Resp 16

## 2013-06-04 DIAGNOSIS — B351 Tinea unguium: Secondary | ICD-10-CM

## 2013-06-04 DIAGNOSIS — M79609 Pain in unspecified limb: Secondary | ICD-10-CM

## 2013-06-04 DIAGNOSIS — L84 Corns and callosities: Secondary | ICD-10-CM

## 2013-06-04 NOTE — Progress Notes (Signed)
Subjective:     Patient ID: [name], male   DOB: Mar 08, 1946, 68 y.o.   MRN: [id number]  HPI patient presents for debridement of painful nail bed 1-5 of both feet that he cannot cut himself   Review of Systems     Objective:   Physical Exam Neurovascular status intact with no health history changes of note and nail disease 1-5 both feet that are thick and painful    Assessment:     Mycotic nail infection with pain 1-5 both feet    Plan:     Debridement painful nail bed 1-5 both feet with no iatrogenic bleeding and also noted to keratotic lesions on the left hallux which were debrided with no bleeding

## 2013-06-19 ENCOUNTER — Other Ambulatory Visit: Payer: Self-pay | Admitting: *Deleted

## 2013-06-19 ENCOUNTER — Other Ambulatory Visit (HOSPITAL_COMMUNITY): Payer: Self-pay | Admitting: Physician Assistant

## 2013-06-19 DIAGNOSIS — R222 Localized swelling, mass and lump, trunk: Secondary | ICD-10-CM

## 2013-06-22 ENCOUNTER — Other Ambulatory Visit (HOSPITAL_COMMUNITY): Payer: Self-pay | Admitting: Family Medicine

## 2013-06-22 DIAGNOSIS — R911 Solitary pulmonary nodule: Secondary | ICD-10-CM

## 2013-06-29 ENCOUNTER — Encounter (HOSPITAL_COMMUNITY): Payer: Self-pay | Admitting: Radiology

## 2013-06-29 ENCOUNTER — Ambulatory Visit (HOSPITAL_COMMUNITY)
Admission: RE | Admit: 2013-06-29 | Discharge: 2013-06-29 | Disposition: A | Discharge status: Home / Self Care | Payer: Medicare Other | Source: Ambulatory Visit | Attending: Family Medicine | Admitting: Family Medicine

## 2013-06-29 DIAGNOSIS — K7689 Other specified diseases of liver: Secondary | ICD-10-CM | POA: Insufficient documentation

## 2013-06-29 DIAGNOSIS — Z9221 Personal history of antineoplastic chemotherapy: Secondary | ICD-10-CM | POA: Insufficient documentation

## 2013-06-29 DIAGNOSIS — R911 Solitary pulmonary nodule: Secondary | ICD-10-CM | POA: Insufficient documentation

## 2013-06-29 DIAGNOSIS — Z923 Personal history of irradiation: Secondary | ICD-10-CM | POA: Insufficient documentation

## 2013-06-29 DIAGNOSIS — Z8501 Personal history of malignant neoplasm of esophagus: Secondary | ICD-10-CM | POA: Insufficient documentation

## 2013-06-29 MED ORDER — FLUDEOXYGLUCOSE F - 18 (FDG) INJECTION
11.0000 | Freq: Once | INTRAVENOUS | Status: AC | PRN
  Administered 2013-06-29: 11 via INTRAVENOUS

## 2013-06-30 LAB — GLUCOSE, CAPILLARY: Glucose-Capillary: 117 mg/dL — ABNORMAL HIGH (ref 70–99)

## 2013-08-31 ENCOUNTER — Ambulatory Visit (INDEPENDENT_AMBULATORY_CARE_PROVIDER_SITE_OTHER): Payer: Medicare Other | Admitting: Podiatry

## 2013-08-31 ENCOUNTER — Encounter: Payer: Self-pay | Admitting: Podiatry

## 2013-08-31 VITALS — BP 164/81 | HR 86 | Resp 16

## 2013-08-31 DIAGNOSIS — B351 Tinea unguium: Secondary | ICD-10-CM

## 2013-08-31 DIAGNOSIS — M79609 Pain in unspecified limb: Secondary | ICD-10-CM

## 2013-08-31 NOTE — Progress Notes (Signed)
Subjective:     Patient ID: [name], male   DOB: January 18, 1946, 68 y.o.   MRN: [id number]  HPI patient presents with thick yellow painful nailbeds 1-5 both feet that he cannot cut   Review of Systems     Objective:   Physical Exam Neurovascular status intact with thick incurvated nailbeds 1-5 of both feet that are painful    Assessment:     Mycotic nail infection with pain 1-5 both feet    Plan:     Debridement painful nailbeds 1-5 both feet with no bleeding noted

## 2013-09-29 ENCOUNTER — Encounter: Payer: Medicare Other | Admitting: Vascular Surgery

## 2013-10-06 ENCOUNTER — Other Ambulatory Visit: Payer: Self-pay | Admitting: Gastroenterology

## 2013-10-06 DIAGNOSIS — R188 Other ascites: Secondary | ICD-10-CM

## 2013-10-13 ENCOUNTER — Encounter: Payer: Medicare Other | Admitting: Vascular Surgery

## 2013-10-19 ENCOUNTER — Other Ambulatory Visit: Payer: Medicare Other

## 2013-10-19 ENCOUNTER — Ambulatory Visit
Admission: RE | Admit: 2013-10-19 | Discharge: 2013-10-19 | Disposition: A | Discharge status: Home / Self Care | Payer: Medicare Other | Source: Ambulatory Visit | Attending: Gastroenterology | Admitting: Gastroenterology

## 2013-10-19 DIAGNOSIS — R188 Other ascites: Secondary | ICD-10-CM

## 2013-11-30 ENCOUNTER — Ambulatory Visit: Payer: Medicare Other | Admitting: Podiatry

## 2013-12-04 ENCOUNTER — Other Ambulatory Visit (HOSPITAL_COMMUNITY): Payer: Self-pay | Admitting: Gastroenterology

## 2013-12-04 DIAGNOSIS — R188 Other ascites: Secondary | ICD-10-CM

## 2013-12-07 ENCOUNTER — Ambulatory Visit (HOSPITAL_COMMUNITY)
Admission: RE | Admit: 2013-12-07 | Discharge: 2013-12-07 | Disposition: A | Discharge status: Home / Self Care | Payer: Medicare Other | Source: Ambulatory Visit | Attending: Gastroenterology | Admitting: Gastroenterology

## 2013-12-07 DIAGNOSIS — R188 Other ascites: Secondary | ICD-10-CM | POA: Insufficient documentation

## 2013-12-07 LAB — ALBUMIN, FLUID (OTHER): Albumin, Fluid: 1.9 g/dL

## 2013-12-07 LAB — BODY FLUID CELL COUNT WITH DIFFERENTIAL
Eos, Fluid: 0 %
LYMPHS FL: 16 %
Monocyte-Macrophage-Serous Fluid: 77 % (ref 50–90)
Neutrophil Count, Fluid: 7 % (ref 0–25)
OTHER CELLS FL: 0 %
Total Nucleated Cell Count, Fluid: 85 cu mm (ref 0–1000)

## 2013-12-07 MED ORDER — LIDOCAINE HCL (PF) 1 % IJ SOLN
INTRAMUSCULAR | Status: AC
  Filled 2013-12-07: qty 10

## 2013-12-07 MED ORDER — ALBUMIN HUMAN 25 % IV SOLN
25.0000 g | Freq: Once | INTRAVENOUS | Status: AC
  Administered 2013-12-07: 25 g via INTRAVENOUS
  Filled 2013-12-07: qty 100

## 2013-12-07 NOTE — Procedures (Signed)
Successful US guided paracentesis from RLQ.  Yielded 2L of clear yellow fluid.  No immediate complications.  Pt tolerated well.   Specimen was sent for labs.  Ascencion Dike PA-C 12/07/2013 1:31 PM

## 2013-12-07 NOTE — Discharge Instructions (Signed)
° ° ° ° ° ° ° ° ° ° ° ° ° ° ° ° ° ° ° ° ° ° ° ° ° ° ° ° ° ° ° ° ° ° ° ° ° ° ° ° ° ° ° ° ° ° ° ° ° ° ° ° ° ° ° ° ° ° ° ° ° ° ° ° ° ° ° ° ° ° ° ° ° ° ° ° ° ° ° ° ° ° ° ° ° ° ° ° ° ° ° ° ° ° ° ° ° ° ° ° ° ° ° ° °  Albumin °Albumin tests are done as a screen for a liver disorder or kidney disease or to check nutritional status, especially in hospitalized patients (prealbumin is sometimes used instead of albumin in this situation). °Albumin is the most plentiful protein in the blood plasma. It keeps fluid from leaking out of blood vessels; nourishes tissues; and transports hormones, vitamins, drugs, and ions like calcium throughout the body. Albumin is made in the liver and is extremely sensitive to liver damage. The concentration of albumin drops when the liver is damaged, with kidney disease (nephrotic syndrome), when a person is malnourished, if a person experiences inflammation in the body, or with shock. Albumin increases when a person is dehydrated. °PREPARATION FOR TEST °No preparation or fasting is necessary. A blood sample is taken by a needle from a vein. Tell the person doing the test if you are pregnant. °NORMAL FINDINGS  °Adult/Elderly °· Total Protein: 6.4-8.3 g/dL or 64-83g/L (SI units) °· Albumin; 3.5-5 g/dL or 35-50 g/L (SI units) °· Globulin 2.3-3.4 g/dL °¨ Alpha1 globulin: 0.1-3 g/dL or 1-3 g/L (SI units) °¨ Alpha2 globulin: 0.6-1 g/dL or6-10 g/L (SI units) °¨ Beta globulin: 0.7-1.1 g/dL or 7-11 g/L (SI units) °Children °· Total protein. °¨ Premature infant: 4.2-7.6 g/L °¨ Newborn: 4.6-7.4 g/dL °¨ Infant: 6-6.7 g/L °· Albumin. °¨ Premature infant: 3-4.2 g/dL °¨ Newborn: 3.5-5.4 g/dL °¨ Infant: 4.4-5.4 g/dL °¨ Child: 4-5.9 g/dL °Ranges for normal findings may vary among different laboratories and hospitals. You should always check with your doctor after having lab work or other tests done to discuss the meaning of your test results and whether your values are considered within normal limits. °MEANING OF  TEST  °Your caregiver will go over the test results with you and discuss the importance and meaning of your results, as well as treatment options and the need for additional tests if necessary. °OBTAINING THE TEST RESULTS °It is your responsibility to obtain your test results. Ask the lab or department performing the test when and how you will get your results. °Document Released: 04/24/2004 Document Revised: 06/25/2011 Document Reviewed: 03/07/2008 °ExitCare® Patient Information ©2015 [employer]. This information is not intended to replace advice given to you by your health care provider. Make sure you discuss any questions you have with your health care provider. ° °

## 2013-12-08 LAB — AMYLASE, PERITONEAL FLUID: Amylase, peritoneal fluid: 12 U/L

## 2013-12-11 LAB — BODY FLUID CULTURE
Culture: NO GROWTH
GRAM STAIN: NONE SEEN

## 2014-01-19 LAB — AFB CULTURE WITH SMEAR (NOT AT ARMC): ACID FAST SMEAR: NONE SEEN

## 2014-05-06 ENCOUNTER — Other Ambulatory Visit: Payer: Self-pay | Admitting: Surgery

## 2014-05-06 ENCOUNTER — Other Ambulatory Visit (INDEPENDENT_AMBULATORY_CARE_PROVIDER_SITE_OTHER): Payer: Self-pay | Admitting: Surgery

## 2014-05-06 DIAGNOSIS — K429 Umbilical hernia without obstruction or gangrene: Secondary | ICD-10-CM

## 2014-05-10 ENCOUNTER — Ambulatory Visit
Admission: RE | Admit: 2014-05-10 | Discharge: 2014-05-10 | Disposition: A | Discharge status: Home / Self Care | Payer: Medicare Other | Source: Ambulatory Visit | Attending: Surgery | Admitting: Surgery

## 2014-05-10 DIAGNOSIS — K429 Umbilical hernia without obstruction or gangrene: Secondary | ICD-10-CM

## 2014-05-10 MED ORDER — IOHEXOL 300 MG/ML  SOLN
100.0000 mL | Freq: Once | INTRAMUSCULAR | Status: AC | PRN
  Administered 2014-05-10: 100 mL via INTRAVENOUS

## 2014-05-31 NOTE — Progress Notes (Signed)
Please put orders in Epic surgery 06-14-14 pre op 06-08-14 Thanks

## 2014-06-07 ENCOUNTER — Other Ambulatory Visit (HOSPITAL_COMMUNITY): Payer: Self-pay | Admitting: *Deleted

## 2014-06-07 NOTE — Patient Instructions (Addendum)
[name]  06/07/2014   Your procedure is scheduled on: Monday February 29th, 2016  Report to [facility] Main  Entrance and follow signs to               [place] at 1000 AM.  Call this number if you have problems the morning of surgery [phone]   Remember:  Do not eat food or drink liquids :After Midnight.    Take these medicines the morning of surgery with A SIP OF WATER: atenolol, prilosec                               You may not have any metal on your body including hair pins and              piercings  Do not wear jewelry, make-up, lotions, powders or perfumes.             Do not wear nail polish.  Do not shave  48 hours prior to surgery.              Men may shave face and neck.  Do not bring valuables to the hospital. [place].  Contacts, dentures or bridgework may not be worn into surgery.  Leave suitcase in the car. After surgery it may be brought to your room.   _____________________________________________________________________            [facility] - Preparing for Surgery Before surgery, you can play an important role.  Because skin is not sterile, your skin needs to be as free of germs as possible.  You can reduce the number of germs on your skin by washing with CHG (chlorahexidine gluconate) soap before surgery.  CHG is an antiseptic cleaner which kills germs and bonds with the skin to continue killing germs even after washing. Please DO NOT use if you have an allergy to CHG or antibacterial soaps.  If your skin becomes reddened/irritated stop using the CHG and inform your nurse when you arrive at Short Stay. Do not shave (including legs and underarms) for at least 48 hours prior to the first CHG shower.  You may shave your face/neck. Please follow these instructions carefully:  1.  Shower with CHG Soap the night before surgery and the  morning of Surgery.  2.  If you choose  to wash your hair, wash your hair first as usual with your  normal  shampoo.  3.  After you shampoo, rinse your hair and body thoroughly to remove the  shampoo.                            4.  Use CHG as you would any other liquid soap.  You can apply chg directly  to the skin and wash                       Gently with a scrungie or clean washcloth.  5.  Apply the CHG Soap to your body ONLY FROM THE NECK DOWN.   Do not use on face/ open  Wound or open sores. Avoid contact with eyes, ears mouth and genitals (private parts).                       Wash face,  Genitals (private parts) with your normal soap.             6.  Wash thoroughly, paying special attention to the area where your surgery  will be performed.  7.  Thoroughly rinse your body with warm water from the neck down.  8.  DO NOT shower/wash with your normal soap after using and rinsing off  the CHG Soap.                9.  Pat yourself dry with a clean towel.            10.  Wear clean pajamas.            11.  Place clean sheets on your bed the night of your first shower and do not  sleep with pets. Day of Surgery : Do not apply any lotions/deodorants the morning of surgery.  Please wear clean clothes to the hospital/surgery center.  FAILURE TO FOLLOW THESE INSTRUCTIONS MAY RESULT IN THE CANCELLATION OF YOUR SURGERY PATIENT SIGNATURE_________________________________  NURSE SIGNATURE__________________________________  ________________________________________________________________________

## 2014-06-08 ENCOUNTER — Other Ambulatory Visit: Payer: Self-pay

## 2014-06-08 ENCOUNTER — Encounter (HOSPITAL_COMMUNITY): Payer: Self-pay

## 2014-06-08 ENCOUNTER — Encounter (HOSPITAL_COMMUNITY)
Admission: RE | Admit: 2014-06-08 | Discharge: 2014-06-08 | Disposition: A | Discharge status: Home / Self Care | Payer: Medicare Other | Source: Ambulatory Visit | Attending: Surgery | Admitting: Surgery

## 2014-06-08 DIAGNOSIS — K429 Umbilical hernia without obstruction or gangrene: Secondary | ICD-10-CM | POA: Insufficient documentation

## 2014-06-08 DIAGNOSIS — Z01818 Encounter for other preprocedural examination: Secondary | ICD-10-CM | POA: Insufficient documentation

## 2014-06-08 HISTORY — DX: Personal history of irradiation: Z92.3

## 2014-06-08 HISTORY — DX: Gastro-esophageal reflux disease without esophagitis: K21.9

## 2014-06-08 HISTORY — DX: Gout, unspecified: M10.9

## 2014-06-08 HISTORY — DX: Essential (primary) hypertension: I10

## 2014-06-08 HISTORY — DX: Umbilical hernia without obstruction or gangrene: K42.9

## 2014-06-08 HISTORY — DX: Fatty (change of) liver, not elsewhere classified: K76.0

## 2014-06-08 HISTORY — DX: Personal history of antineoplastic chemotherapy: Z92.21

## 2014-06-08 HISTORY — DX: Malignant neoplasm of esophagus, unspecified: C15.9

## 2014-06-08 LAB — COMPREHENSIVE METABOLIC PANEL
ALK PHOS: 116 U/L (ref 39–117)
ALT: 12 U/L (ref 0–53)
ANION GAP: 10 (ref 5–15)
AST: 29 U/L (ref 0–37)
Albumin: 4.3 g/dL (ref 3.5–5.2)
BILIRUBIN TOTAL: 0.9 mg/dL (ref 0.3–1.2)
BUN: 22 mg/dL (ref 6–23)
CHLORIDE: 98 mmol/L (ref 96–112)
CO2: 24 mmol/L (ref 19–32)
CREATININE: 1.32 mg/dL (ref 0.50–1.35)
Calcium: 9.6 mg/dL (ref 8.4–10.5)
GFR calc Af Amer: 61 mL/min — ABNORMAL LOW (ref >90)
GFR, EST NON AFRICAN AMERICAN: 53 mL/min — AB (ref >90)
GLUCOSE: 106 mg/dL — AB (ref 70–99)
POTASSIUM: 6.1 mmol/L — AB (ref 3.5–5.1)
Sodium: 132 mmol/L — ABNORMAL LOW (ref 135–145)
Total Protein: 7.9 g/dL (ref 6.0–8.3)

## 2014-06-08 LAB — CBC
HCT: 36 % — ABNORMAL LOW (ref 39.0–52.0)
HEMOGLOBIN: 12.2 g/dL — AB (ref 13.0–17.0)
MCH: 35.2 pg — AB (ref 26.0–34.0)
MCHC: 33.9 g/dL (ref 30.0–36.0)
MCV: 103.7 fL — ABNORMAL HIGH (ref 78.0–100.0)
PLATELETS: 168 10*3/uL (ref 150–400)
RBC: 3.47 MIL/uL — AB (ref 4.22–5.81)
RDW: 14.2 % (ref 11.5–15.5)
WBC: 4.9 10*3/uL (ref 4.0–10.5)

## 2014-06-08 NOTE — Progress Notes (Signed)
CRITICAL VALUE ALERT  Critical value received:  Potassium 6.1  Date of notification:  06-08-14  Time of notification:  8979  Critical value read back:yes  Nurse who received alert:tonya Suan Halter  MD notified (1st page):  Called [facility] [place] [facility], dr Hassell Done off, spoke with [place]e Nodal rn, will page md on call  Time of first page:  1158  MD notified (2nd page):  Time of second page:  Responding MD:   Time MD responded:

## 2014-06-08 NOTE — Progress Notes (Signed)
   06/08/14 0945  OBSTRUCTIVE SLEEP APNEA  Have you ever been diagnosed with sleep apnea through a sleep study? No  Do you snore loudly (loud enough to be heard through closed doors)?  0 ("got a bed that lifts his head up-no problems")  Do you often feel tired, fatigued, or sleepy during the daytime? 0  Has anyone observed you stop breathing during your sleep? 0  Do you have, or are you being treated for high blood pressure? 1  BMI more than 35 kg/m2? 0  Age over 50 years old? 1  Neck circumference greater than 40 cm/16 inches? 1  Gender: 1

## 2014-06-11 NOTE — Progress Notes (Signed)
Need orders in EPIC .  Surgery on 06/14/2014.  Thank You.

## 2014-06-14 ENCOUNTER — Ambulatory Visit (HOSPITAL_COMMUNITY): Payer: Medicare Other | Admitting: Certified Registered Nurse Anesthetist

## 2014-06-14 ENCOUNTER — Observation Stay (HOSPITAL_COMMUNITY)
Admission: RE | Admit: 2014-06-14 | Discharge: 2014-06-15 | Disposition: A | Discharge status: Home / Self Care | Payer: Medicare Other | Source: Ambulatory Visit | Attending: Surgery | Admitting: Surgery

## 2014-06-14 ENCOUNTER — Encounter (HOSPITAL_COMMUNITY)
Admission: RE | Disposition: A | Discharge status: Home / Self Care | Payer: Self-pay | Source: Ambulatory Visit | Attending: Surgery

## 2014-06-14 ENCOUNTER — Encounter (HOSPITAL_COMMUNITY): Payer: Self-pay | Admitting: *Deleted

## 2014-06-14 DIAGNOSIS — Z87891 Personal history of nicotine dependence: Secondary | ICD-10-CM | POA: Insufficient documentation

## 2014-06-14 DIAGNOSIS — D649 Anemia, unspecified: Secondary | ICD-10-CM | POA: Diagnosis not present

## 2014-06-14 DIAGNOSIS — R188 Other ascites: Secondary | ICD-10-CM | POA: Insufficient documentation

## 2014-06-14 DIAGNOSIS — K219 Gastro-esophageal reflux disease without esophagitis: Secondary | ICD-10-CM | POA: Diagnosis not present

## 2014-06-14 DIAGNOSIS — I10 Essential (primary) hypertension: Secondary | ICD-10-CM | POA: Insufficient documentation

## 2014-06-14 DIAGNOSIS — K429 Umbilical hernia without obstruction or gangrene: Secondary | ICD-10-CM | POA: Diagnosis not present

## 2014-06-14 HISTORY — PX: UMBILICAL HERNIA REPAIR: SHX196

## 2014-06-14 LAB — CREATININE, SERUM
Creatinine, Ser: 1.27 mg/dL (ref 0.50–1.35)
GFR calc non Af Amer: 56 mL/min — ABNORMAL LOW (ref >90)
GFR, EST AFRICAN AMERICAN: 64 mL/min — AB (ref >90)

## 2014-06-14 LAB — CBC
HCT: 33.4 % — ABNORMAL LOW (ref 39.0–52.0)
Hemoglobin: 11.2 g/dL — ABNORMAL LOW (ref 13.0–17.0)
MCH: 35 pg — AB (ref 26.0–34.0)
MCHC: 33.5 g/dL (ref 30.0–36.0)
MCV: 104.4 fL — ABNORMAL HIGH (ref 78.0–100.0)
PLATELETS: 147 10*3/uL — AB (ref 150–400)
RBC: 3.2 MIL/uL — ABNORMAL LOW (ref 4.22–5.81)
RDW: 14.2 % (ref 11.5–15.5)
WBC: 4.5 10*3/uL (ref 4.0–10.5)

## 2014-06-14 SURGERY — REPAIR, HERNIA, UMBILICAL, ADULT
Anesthesia: General | Site: Abdomen

## 2014-06-14 MED ORDER — SODIUM CHLORIDE 0.9 % IV SOLN
10.0000 mg | INTRAVENOUS | Status: DC | PRN
  Administered 2014-06-14: 50 ug/min via INTRAVENOUS

## 2014-06-14 MED ORDER — CEFAZOLIN SODIUM-DEXTROSE 2-3 GM-% IV SOLR
2.0000 g | Freq: Once | INTRAVENOUS | Status: AC
  Administered 2014-06-14: 2 g via INTRAVENOUS
  Filled 2014-06-14: qty 50

## 2014-06-14 MED ORDER — FENTANYL CITRATE 0.05 MG/ML IJ SOLN
INTRAMUSCULAR | Status: DC | PRN
  Administered 2014-06-14 (×2): 25 ug via INTRAVENOUS
  Administered 2014-06-14: 150 ug via INTRAVENOUS
  Administered 2014-06-14 (×2): 25 ug via INTRAVENOUS

## 2014-06-14 MED ORDER — OXYCODONE HCL 5 MG/5ML PO SOLN: 5.0000 mg | ORAL | Status: DC | PRN

## 2014-06-14 MED ORDER — ALBUMIN HUMAN 5 % IV SOLN
INTRAVENOUS | Status: DC | PRN
  Administered 2014-06-14: 11:00:00 via INTRAVENOUS

## 2014-06-14 MED ORDER — PROPOFOL 10 MG/ML IV BOLUS
INTRAVENOUS | Status: DC | PRN
  Administered 2014-06-14: 170 mg via INTRAVENOUS

## 2014-06-14 MED ORDER — FENTANYL CITRATE 0.05 MG/ML IJ SOLN
INTRAMUSCULAR | Status: AC
  Filled 2014-06-14: qty 5

## 2014-06-14 MED ORDER — PROPOFOL 10 MG/ML IV BOLUS
INTRAVENOUS | Status: AC
  Filled 2014-06-14: qty 20

## 2014-06-14 MED ORDER — OXYCODONE HCL 5 MG/5ML PO SOLN
5.0000 mg | Freq: Once | ORAL | Status: DC | PRN
  Filled 2014-06-14: qty 5

## 2014-06-14 MED ORDER — MORPHINE SULFATE 2 MG/ML IJ SOLN
1.0000 mg | INTRAMUSCULAR | Status: DC | PRN
  Administered 2014-06-14 (×3): 1 mg via INTRAVENOUS
  Filled 2014-06-14 (×3): qty 1

## 2014-06-14 MED ORDER — GLYCOPYRROLATE 0.2 MG/ML IJ SOLN
INTRAMUSCULAR | Status: AC
  Filled 2014-06-14: qty 3

## 2014-06-14 MED ORDER — ALBUMIN HUMAN 5 % IV SOLN
INTRAVENOUS | Status: AC
  Filled 2014-06-14: qty 250

## 2014-06-14 MED ORDER — ALLOPURINOL 300 MG PO TABS
300.0000 mg | ORAL_TABLET | Freq: Every day | ORAL | Status: DC
  Administered 2014-06-14: 300 mg via ORAL
  Filled 2014-06-14 (×2): qty 1

## 2014-06-14 MED ORDER — LACTATED RINGERS IV SOLN
INTRAVENOUS | Status: DC | PRN
  Administered 2014-06-14: 11:00:00 via INTRAVENOUS

## 2014-06-14 MED ORDER — GLYCOPYRROLATE 0.2 MG/ML IJ SOLN
INTRAMUSCULAR | Status: DC | PRN
  Administered 2014-06-14: .6 mg via INTRAVENOUS

## 2014-06-14 MED ORDER — SODIUM CHLORIDE 0.45 % IV SOLN: INTRAVENOUS | Status: DC

## 2014-06-14 MED ORDER — HYDROMORPHONE HCL 1 MG/ML IJ SOLN
0.2500 mg | INTRAMUSCULAR | Status: DC | PRN
  Administered 2014-06-14 (×2): 0.5 mg via INTRAVENOUS

## 2014-06-14 MED ORDER — SUCCINYLCHOLINE CHLORIDE 20 MG/ML IJ SOLN
INTRAMUSCULAR | Status: DC | PRN
  Administered 2014-06-14: 100 mg via INTRAVENOUS

## 2014-06-14 MED ORDER — SPIRONOLACTONE 50 MG PO TABS
50.0000 mg | ORAL_TABLET | Freq: Every morning | ORAL | Status: DC
  Filled 2014-06-14 (×2): qty 1

## 2014-06-14 MED ORDER — SODIUM CHLORIDE 0.45 % IV SOLN
INTRAVENOUS | Status: DC | PRN
  Administered 2014-06-14: 11:00:00 via INTRAVENOUS

## 2014-06-14 MED ORDER — NEOSTIGMINE METHYLSULFATE 10 MG/10ML IV SOLN
INTRAVENOUS | Status: DC | PRN
  Administered 2014-06-14: 4 mg via INTRAVENOUS

## 2014-06-14 MED ORDER — EPHEDRINE SULFATE 50 MG/ML IJ SOLN
INTRAMUSCULAR | Status: AC
  Filled 2014-06-14: qty 1

## 2014-06-14 MED ORDER — SODIUM CHLORIDE 0.45 % IV SOLN: INTRAVENOUS | Status: DC | PRN

## 2014-06-14 MED ORDER — ONDANSETRON HCL 4 MG/2ML IJ SOLN
INTRAMUSCULAR | Status: AC
  Filled 2014-06-14: qty 2

## 2014-06-14 MED ORDER — PROMETHAZINE HCL 25 MG/ML IJ SOLN: 6.2500 mg | INTRAMUSCULAR | Status: DC | PRN

## 2014-06-14 MED ORDER — HYDROMORPHONE HCL 1 MG/ML IJ SOLN
INTRAMUSCULAR | Status: AC
  Filled 2014-06-14: qty 1

## 2014-06-14 MED ORDER — LIDOCAINE HCL (CARDIAC) 20 MG/ML IV SOLN
INTRAVENOUS | Status: AC
  Filled 2014-06-14: qty 5

## 2014-06-14 MED ORDER — SODIUM CHLORIDE 0.9 % IJ SOLN
INTRAMUSCULAR | Status: AC
  Filled 2014-06-14: qty 10

## 2014-06-14 MED ORDER — 0.9 % SODIUM CHLORIDE (POUR BTL) OPTIME
TOPICAL | Status: DC | PRN
  Administered 2014-06-14: 1000 mL

## 2014-06-14 MED ORDER — ACETAMINOPHEN 10 MG/ML IV SOLN
1000.0000 mg | Freq: Once | INTRAVENOUS | Status: AC
  Administered 2014-06-14: 1000 mg via INTRAVENOUS
  Filled 2014-06-14: qty 100

## 2014-06-14 MED ORDER — NEOSTIGMINE METHYLSULFATE 10 MG/10ML IV SOLN
INTRAVENOUS | Status: AC
  Filled 2014-06-14: qty 1

## 2014-06-14 MED ORDER — CISATRACURIUM BESYLATE (PF) 10 MG/5ML IV SOLN
INTRAVENOUS | Status: DC | PRN
  Administered 2014-06-14: 6 mg via INTRAVENOUS
  Administered 2014-06-14: 2 mg via INTRAVENOUS

## 2014-06-14 MED ORDER — EPHEDRINE SULFATE 50 MG/ML IJ SOLN
INTRAMUSCULAR | Status: DC | PRN
  Administered 2014-06-14 (×3): 10 mg via INTRAVENOUS

## 2014-06-14 MED ORDER — ATENOLOL 50 MG PO TABS
50.0000 mg | ORAL_TABLET | Freq: Every morning | ORAL | Status: DC
  Filled 2014-06-14: qty 1

## 2014-06-14 MED ORDER — PANTOPRAZOLE SODIUM 40 MG PO TBEC
40.0000 mg | DELAYED_RELEASE_TABLET | Freq: Every day | ORAL | Status: DC
  Filled 2014-06-14 (×2): qty 1

## 2014-06-14 MED ORDER — OXYCODONE HCL 5 MG PO TABS: 5.0000 mg | ORAL_TABLET | Freq: Once | ORAL | Status: DC | PRN

## 2014-06-14 MED ORDER — LIDOCAINE HCL (CARDIAC) 20 MG/ML IV SOLN
INTRAVENOUS | Status: DC | PRN
  Administered 2014-06-14: 100 mg via INTRAVENOUS

## 2014-06-14 MED ORDER — CEFAZOLIN SODIUM-DEXTROSE 2-3 GM-% IV SOLR
INTRAVENOUS | Status: AC
  Filled 2014-06-14: qty 50

## 2014-06-14 MED ORDER — ONDANSETRON HCL 4 MG/2ML IJ SOLN
INTRAMUSCULAR | Status: DC | PRN
  Administered 2014-06-14: 4 mg via INTRAVENOUS

## 2014-06-14 MED ORDER — HEPARIN SODIUM (PORCINE) 5000 UNIT/ML IJ SOLN
5000.0000 [IU] | Freq: Three times a day (TID) | INTRAMUSCULAR | Status: DC
Start: 2014-06-14 — End: 2014-06-15
  Administered 2014-06-14 – 2014-06-15 (×2): 5000 [IU] via SUBCUTANEOUS
  Filled 2014-06-14 (×6): qty 1

## 2014-06-14 MED ORDER — BUPIVACAINE LIPOSOME 1.3 % IJ SUSP
20.0000 mL | Freq: Once | INTRAMUSCULAR | Status: DC
  Filled 2014-06-14: qty 20

## 2014-06-14 MED ORDER — DEXTROSE 5 % IV SOLN
INTRAVENOUS | Status: DC
  Administered 2014-06-14: 15:00:00 via INTRAVENOUS

## 2014-06-14 MED ORDER — ONDANSETRON HCL 4 MG/2ML IJ SOLN
4.0000 mg | INTRAMUSCULAR | Status: DC | PRN
  Administered 2014-06-14: 4 mg via INTRAVENOUS
  Filled 2014-06-14: qty 2

## 2014-06-14 SURGICAL SUPPLY — 28 items
CLOSURE WOUND 1/2 X4 (GAUZE/BANDAGES/DRESSINGS)
DECANTER SPIKE VIAL GLASS SM (MISCELLANEOUS) ×4 IMPLANT
DEVICE TROCAR PUNCTURE CLOSURE (ENDOMECHANICALS) IMPLANT
DRAIN CHANNEL 19F RND (DRAIN) IMPLANT
DRAPE LAPAROSCOPIC ABDOMINAL (DRAPES) ×4 IMPLANT
ELECT REM PT RETURN 9FT ADLT (ELECTROSURGICAL) ×4
ELECTRODE REM PT RTRN 9FT ADLT (ELECTROSURGICAL) ×2 IMPLANT
EVACUATOR SILICONE 100CC (DRAIN) IMPLANT
GLOVE BIOGEL M 8.0 STRL (GLOVE) ×4 IMPLANT
GOWN SPEC L4 XLG W/TWL (GOWN DISPOSABLE) ×4 IMPLANT
GOWN STRL REUS W/TWL XL LVL3 (GOWN DISPOSABLE) ×12 IMPLANT
KIT BASIN OR (CUSTOM PROCEDURE TRAY) ×4 IMPLANT
LIQUID BAND (GAUZE/BANDAGES/DRESSINGS) ×3 IMPLANT
PENCIL BUTTON HOLSTER BLD 10FT (ELECTRODE) ×4 IMPLANT
SHEARS HARMONIC ACE PLUS 36CM (ENDOMECHANICALS) ×4 IMPLANT
SLEEVE XCEL OPT CAN 5 100 (ENDOMECHANICALS) ×2 IMPLANT
STAPLER VISISTAT 35W (STAPLE) ×3 IMPLANT
STRIP CLOSURE SKIN 1/2X4 (GAUZE/BANDAGES/DRESSINGS) IMPLANT
SUT PROLENE 0 CT 1 CR/8 (SUTURE) ×3 IMPLANT
SUT VIC AB 4-0 PS2 27 (SUTURE) ×3 IMPLANT
SUT VIC AB 4-0 SH 18 (SUTURE) ×4 IMPLANT
TACKER 5MM HERNIA 3.5CML NAB (ENDOMECHANICALS) ×1 IMPLANT
TOWEL OR 17X26 10 PK STRL BLUE (TOWEL DISPOSABLE) ×4 IMPLANT
TOWEL OR NON WOVEN STRL DISP B (DISPOSABLE) ×4 IMPLANT
TRAY FOLEY CATH 14FRSI W/METER (CATHETERS) ×4 IMPLANT
TRAY LAPAROSCOPIC (CUSTOM PROCEDURE TRAY) ×4 IMPLANT
TROCAR BLADELESS OPT 5 100 (ENDOMECHANICALS) ×1 IMPLANT
TROCAR XCEL NON-BLD 11X100MML (ENDOMECHANICALS) ×1 IMPLANT

## 2014-06-14 NOTE — Op Note (Signed)
Surgeon: Kaylyn Lim, MD, FACS  Asst:  none  Anes:  general  Procedure: Excision of grossly enlarged umbilical hernia sac with primary closure of 1 cm fascial defect   Diagnosis: Umbilical hernia is case with history of ascites  Complications: none  EBL:   minimla cc  Drains: none  Description of Procedure:  The patient was taken to OR 1 at [facility].  After anesthesia was administered and the patient was prepped a timeout was performed.  The umbilical skin looked like a large mushroom.  I excised this and the sac.  A  1 cm fascial defect was then closed with 4 sutures of 0 prolene through the fascia.  The skin was closed transversely with 4-0 Vicryl in layers with Liquiban.    The patient tolerated the procedure well and was taken to the PACU in stable condition.     Matt B. Hassell Done, [place], [facility], [place]

## 2014-06-14 NOTE — Anesthesia Preprocedure Evaluation (Addendum)
Anesthesia Evaluation  Patient identified by MRN, date of birth, ID band Patient awake    Reviewed: Allergy & Precautions, NPO status , Patient's Chart, lab work & pertinent test results, reviewed documented beta blocker date and time   Airway Mallampati: III  TM Distance: >3 FB Neck ROM: Full    Dental  (+) Teeth Intact, Dental Advisory Given   Pulmonary former smoker,  breath sounds clear to auscultation        Cardiovascular hypertension, Pt. on medications and Pt. on home beta blockers Rhythm:Regular Rate:Normal     Neuro/Psych negative neurological ROS     GI/Hepatic Neg liver ROS, GERD-  ,  Endo/Other  negative endocrine ROS  Renal/GU CRFRenal disease     Musculoskeletal   Abdominal   Peds  Hematology  (+) anemia ,   Anesthesia Other Findings   Reproductive/Obstetrics                            Anesthesia Physical Anesthesia Plan  ASA: III  Anesthesia Plan: General   Post-op Pain Management:    Induction: Intravenous  Airway Management Planned: LMA and Oral ETT  Additional Equipment:   Intra-op Plan:   Post-operative Plan: Extubation in OR  Informed Consent: I have reviewed the patients History and Physical, chart, labs and discussed the procedure including the risks, benefits and alternatives for the proposed anesthesia with the patient or authorized representative who has indicated his/her understanding and acceptance.   Dental advisory given  Plan Discussed with: CRNA  Anesthesia Plan Comments:         Anesthesia Quick Evaluation

## 2014-06-14 NOTE — H&P (Signed)
Chief Complaint:  Symptomatic umbilical hernia  History of Present Illness:  [name] is an 69 y.o. male with prior esophagectomy and history of ascites.  Has had sympotomatic umbilical hernia.  Now for umbilical hernia repair.   Past Medical History  Diagnosis Date  . Hypertension     under control  . GERD (gastroesophageal reflux disease)     under control  . Esophageal cancer 2002  . History of cancer chemotherapy   . Hx of radiation therapy 2002  . Gout     under control  . Umbilical hernia   . Fatty liver     "under control, being monitored"    Past Surgical History  Procedure Laterality Date  . Esophagus surgery  2002  . Incisional hernia repair  2004  . Tonsillectomy  as child    Current Facility-Administered Medications  Medication Dose Route Frequency Provider Last Rate Last Dose  . bupivacaine liposome (EXPAREL) 1.3 % injection 266 mg  20 mL Infiltration Once Pedro Earls, MD       Amoxicillin Family History  Problem Relation Age of Onset  . Diabetes Father   . Diabetes Brother    Social History:   reports that he quit smoking about 10 years ago. His smoking use included Cigarettes. He has a 20 pack-year smoking history. He has never used smokeless tobacco. He reports that he drinks alcohol. He reports that he does not use illicit drugs.   REVIEW OF SYSTEMS : Negative except for see problem list  Physical Exam:   Blood pressure 139/76, pulse 62, temperature 98.4 F (36.9 C), temperature source Oral, resp. rate 18, height 6\' 2"  (1.88 m), weight 204 lb (92.534 kg), SpO2 100 %. Body mass index is 26.18 kg/(m^2).  Gen:  WDWN WM NAD  Neurological: Alert and oriented to person, place, and time. Motor and sensory function is grossly intact  Head: Normocephalic and atraumatic.  Eyes: Conjunctivae are normal. Pupils are equal, round, and reactive to light. No scleral icterus.  Neck: Normal range of motion. Neck supple. No tracheal deviation or  thyromegaly present.  Cardiovascular:  SR without murmurs or gallops.  No carotid bruits Breast:  Not examined Respiratory: Effort normal.  No respiratory distress. No chest wall tenderness. Breath sounds normal.  No wheezes, rales or rhonchi.  Abdomen:  Umbilical skin chronically stretched.  Palpable defect GU:  Not examined Musculoskeletal: Normal range of motion. Extremities are nontender. No cyanosis, edema or clubbing noted Lymphadenopathy: No cervical, preauricular, postauricular or axillary adenopathy is present Skin: Skin is warm and dry. No rash noted. No diaphoresis. No erythema. No pallor. Pscyh: Normal mood and affect. Behavior is normal. Judgment and thought content normal.   LABORATORY RESULTS: No results found for this or any previous visit (from the past 48 hour(s)).   RADIOLOGY RESULTS: No results found.  Problem List: Patient Active Problem List   Diagnosis Date Noted  . Ascites-treated 06/14/2014  . Umbilical hernia 94/85/4627    Assessment & Plan: Umbilical hernia for repair    Matt B. Hassell Done, MD, [facility] [phone] beeper [phone]  06/14/2014 10:40 AM

## 2014-06-14 NOTE — Transfer of Care (Signed)
Immediate Anesthesia Transfer of Care Note  Patient: [name]  Procedure(s) Performed: Procedure(s):  OPEN UMBILICAL HERNIA REPAIR (N/A)  Patient Location: PACU  Anesthesia Type:General  Level of Consciousness: awake, alert  and oriented  Airway & Oxygen Therapy: Patient Spontanous Breathing and Patient connected to face mask oxygen  Post-op Assessment: Report given to RN and Post -op Vital signs reviewed and stable  Post vital signs: Reviewed and stable  Last Vitals:  Filed Vitals:   06/14/14 0949  BP: 139/76  Pulse: 62  Temp: 36.9 C  Resp: 18    Complications: No apparent anesthesia complications

## 2014-06-14 NOTE — Anesthesia Postprocedure Evaluation (Signed)
  Anesthesia Post-op Note  Patient: [name]  Procedure(s) Performed: Procedure(s):  OPEN UMBILICAL HERNIA REPAIR (N/A)  Patient Location: PACU  Anesthesia Type:General  Level of Consciousness: awake and alert   Airway and Oxygen Therapy: Patient Spontanous Breathing  Post-op Pain: none  Post-op Assessment: Post-op Vital signs reviewed  Post-op Vital Signs: Reviewed  Last Vitals:  Filed Vitals:   06/14/14 1316  BP: 127/80  Pulse: 57  Temp: 36.6 C  Resp:     Complications: No apparent anesthesia complications

## 2014-06-14 NOTE — Anesthesia Procedure Notes (Signed)
Procedure Name: Intubation Date/Time: 06/14/2014 11:14 AM Performed by: Danley Danker L Patient Re-evaluated:Patient Re-evaluated prior to inductionOxygen Delivery Method: Circle system utilized Preoxygenation: Pre-oxygenation with 100% oxygen Intubation Type: IV induction Ventilation: Mask ventilation without difficulty and Oral airway inserted - appropriate to patient size Laryngoscope Size: Miller and 3 Grade View: Grade I Tube type: Oral Tube size: 8.0 mm Number of attempts: 1 Airway Equipment and Method: Stylet Placement Confirmation: ETT inserted through vocal cords under direct vision,  breath sounds checked- equal and bilateral and positive ETCO2 Secured at: 22 cm Tube secured with: Tape Dental Injury: Teeth and Oropharynx as per pre-operative assessment

## 2014-06-15 ENCOUNTER — Encounter (HOSPITAL_COMMUNITY): Payer: Self-pay | Admitting: Surgery

## 2014-06-15 DIAGNOSIS — K429 Umbilical hernia without obstruction or gangrene: Secondary | ICD-10-CM | POA: Diagnosis not present

## 2014-06-15 LAB — BASIC METABOLIC PANEL
Anion gap: 9 (ref 5–15)
BUN: 21 mg/dL (ref 6–23)
CALCIUM: 8.8 mg/dL (ref 8.4–10.5)
CO2: 23 mmol/L (ref 19–32)
Chloride: 97 mmol/L (ref 96–112)
Creatinine, Ser: 1.2 mg/dL (ref 0.50–1.35)
GFR calc non Af Amer: 60 mL/min — ABNORMAL LOW (ref >90)
GFR, EST AFRICAN AMERICAN: 69 mL/min — AB (ref >90)
GLUCOSE: 106 mg/dL — AB (ref 70–99)
Potassium: 5.3 mmol/L — ABNORMAL HIGH (ref 3.5–5.1)
SODIUM: 129 mmol/L — AB (ref 135–145)

## 2014-06-15 LAB — POCT I-STAT 4, (NA,K, GLUC, HGB,HCT)
Glucose, Bld: 96 mg/dL (ref 70–99)
HCT: 36 % — ABNORMAL LOW (ref 39.0–52.0)
Hemoglobin: 12.2 g/dL — ABNORMAL LOW (ref 13.0–17.0)
Potassium: 4.6 mmol/L (ref 3.5–5.1)
Sodium: 133 mmol/L — ABNORMAL LOW (ref 135–145)

## 2014-06-15 LAB — CBC
HCT: 33.6 % — ABNORMAL LOW (ref 39.0–52.0)
Hemoglobin: 11.2 g/dL — ABNORMAL LOW (ref 13.0–17.0)
MCH: 34.9 pg — AB (ref 26.0–34.0)
MCHC: 33.3 g/dL (ref 30.0–36.0)
MCV: 104.7 fL — AB (ref 78.0–100.0)
Platelets: 136 10*3/uL — ABNORMAL LOW (ref 150–400)
RBC: 3.21 MIL/uL — AB (ref 4.22–5.81)
RDW: 14.2 % (ref 11.5–15.5)
WBC: 4.2 10*3/uL (ref 4.0–10.5)

## 2014-06-15 MED ORDER — OXYCODONE HCL 5 MG PO TABS: 5.0000 mg | ORAL_TABLET | Freq: Four times a day (QID) | ORAL | Status: DC | PRN

## 2014-06-15 NOTE — Progress Notes (Signed)
Nurse reviewed discharge instructions with pt.  Pt verbalized understanding of discharge instructions, follow up appointment and new medications.  Abdominal binder and prescription given prior to discharge.  No concerns at time of discharge.

## 2014-06-15 NOTE — Discharge Summary (Signed)
Physician Discharge Summary  Patient ID: Jacari Iannello MRN: [id number] DOB/AGE: Jul 09, 1945 69 y.o.  Admit date: 06/14/2014 Discharge date: 06/15/2014  Admission Diagnoses:  Umbilical hernia  Discharge Diagnoses:  same  Active Problems:   Ascites-treated   Surgery:  Open repair of umbilical hernia with redundant skin related to ascites  Discharged Condition: improved  Hospital Course:   Had surgery and kept overnight.  Ready for discharge on PD 1  Consults: none  Significant Diagnostic Studies: none    Discharge Exam: Blood pressure 140/79, pulse 54, temperature 97.7 F (36.5 C), temperature source Oral, resp. rate 20, height 6\' 2"  (1.88 m), weight 204 lb (92.534 kg), SpO2 100 %. Incision ok  Disposition:   Discharge Instructions    Abdominal Binder    Complete by:  As directed      Diet - low sodium heart healthy    Complete by:  As directed      Increase activity slowly    Complete by:  As directed             Medication List    TAKE these medications        allopurinol 300 MG tablet  Commonly known as:  ZYLOPRIM  Take 300 mg by mouth daily.     atenolol 50 MG tablet  Commonly known as:  TENORMIN  Take 50 mg by mouth every morning.     omeprazole 20 MG capsule  Commonly known as:  PRILOSEC  Take 20 mg by mouth daily.     oxyCODONE 5 MG immediate release tablet  Commonly known as:  Oxy IR/ROXICODONE  Take 1 tablet (5 mg total) by mouth every 6 (six) hours as needed for severe pain.     spironolactone 50 MG tablet  Commonly known as:  ALDACTONE  Take 50 mg by mouth every morning.           Follow-up Information    Follow up with Pedro Earls, MD.   Specialty:  General Surgery   Contact information:   [place] [address] [phone]       Signed: Pedro Earls 06/15/2014, 7:33 AM

## 2014-06-15 NOTE — Discharge Instructions (Signed)
Umbilical Herniorrhaphy Herniorrhaphy is surgery to repair a hernia. A hernia is the protrusion of a part of an organ through an abdominal opening. An umbilical hernia means that your hernia is in the area around your navel. If the hernia is not repaired, the gap could get bigger. Your intestines or other tissues, such as fat, could get trapped in the gap. This can lead to other health problems, such as blocked intestines. If the hernia is fixed before problems set in, you may be allowed to go home the same day as the surgery (outpatient). LET [facility] CARE PROVIDER KNOW ABOUT:  Allergies to food or medicine.  Medicines taken, including vitamins, herbs, eye drops, over-the-counter medicines, and creams.  Use of steroids (by mouth or creams).  Previous problems with anesthetics or numbing medicines.  History of bleeding problems or blood clots.  Previous surgery.  Other health problems, including diabetes and kidney problems.  Possibility of pregnancy, if this applies. RISKS AND COMPLICATIONS  Pain.  Excessive bleeding.  Hematoma. This is a pocket of blood that collects under the surgery site.  Infection at the surgery site.  Numbness at the surgery site.  Swelling and bruising.  Blood clots.  Intestinal damage (rare).  Scarring.  Skin damage.  Development of another hernia. This may require another surgery. BEFORE THE PROCEDURE  Ask your health care provider about changing or stopping your regular medicines. You may need to stop taking aspirin, nonsteroidal anti-inflammatory drugs (NSAIDs), vitamin E, and blood thinners as early as 2 weeks before the procedure.  Do not eat or drink for 8 hours before the procedure, or as directed by your health care provider.  You might be asked to shower or wash with an antibacterial soap before the procedure.  Wear comfortable clothes that will be easy to put on after the procedure. PROCEDURE You will be given an intravenous  (IV) tube. A needle will be inserted in your arm. Medicine will flow directly into your body through this needle. You might be given medicine to help you relax (sedative). You will be given medicine that numbs the area (local anesthetic) or medicine that makes you sleep (general anesthetic). If you have open surgery:  The surgeon will make a cut (incision) in your abdomen.  The gap in the muscle wall will be repaired. The surgeon may sew the edges together over the gap or use a mesh material to strengthen the area. When mesh is used, the body grows new, strong tissue into and around it. This new tissue closes the gap.  A drain might be put in to remove excess fluid from the body after surgery.  The surgeon will close the incision with stitches, glue, or staples. If you have laparoscopic surgery:  The surgeon will make several small incisions in your abdomen.  A thin, lighted tube (laparoscope) will be inserted into the abdomen through an incision. A camera is attached to the laparoscope that allows the surgeon to see inside the abdomen.  Tools will be inserted through the other incisions to repair the hernia. Usually, mesh is used to cover the gap.  The surgeon will close the incisions with stitches. AFTER THE PROCEDURE  You will be taken to a recovery area. A nurse will watch and check your progress.  When you are awake, feeling well, and taking fluids well, you may be allowed to go home. In some cases, you may need to stay overnight in the hospital.  Arrange for someone to drive you home.  Document Released: 06/29/2008 Document Revised: 08/17/2013 Document Reviewed: 07/04/2011 [facility] Patient Information 2015 [place], [place]. This information is not intended to replace advice given to you by your health care provider. Make sure you discuss any questions you have with your health care provider.

## 2014-12-16 ENCOUNTER — Other Ambulatory Visit: Payer: Self-pay | Admitting: Gastroenterology

## 2014-12-16 DIAGNOSIS — K7031 Alcoholic cirrhosis of liver with ascites: Secondary | ICD-10-CM

## 2014-12-24 ENCOUNTER — Ambulatory Visit
Admission: RE | Admit: 2014-12-24 | Discharge: 2014-12-24 | Disposition: A | Discharge status: Home / Self Care | Payer: Medicare Other | Source: Ambulatory Visit | Attending: Gastroenterology | Admitting: Gastroenterology

## 2014-12-24 DIAGNOSIS — K7031 Alcoholic cirrhosis of liver with ascites: Secondary | ICD-10-CM

## 2014-12-27 ENCOUNTER — Other Ambulatory Visit: Payer: Self-pay | Admitting: Gastroenterology

## 2014-12-27 DIAGNOSIS — Z95828 Presence of other vascular implants and grafts: Secondary | ICD-10-CM

## 2015-01-05 ENCOUNTER — Other Ambulatory Visit (HOSPITAL_COMMUNITY): Payer: Self-pay | Admitting: Interventional Radiology

## 2015-01-05 ENCOUNTER — Ambulatory Visit
Admission: RE | Admit: 2015-01-05 | Discharge: 2015-01-05 | Disposition: A | Discharge status: Home / Self Care | Payer: Medicare Other | Source: Ambulatory Visit | Attending: Gastroenterology | Admitting: Gastroenterology

## 2015-01-05 VITALS — BP 145/92 | HR 74 | Temp 97.7°F | Resp 20 | Ht 74.0 in | Wt 240.0 lb

## 2015-01-05 DIAGNOSIS — M109 Gout, unspecified: Secondary | ICD-10-CM | POA: Insufficient documentation

## 2015-01-05 DIAGNOSIS — Z95828 Presence of other vascular implants and grafts: Secondary | ICD-10-CM

## 2015-01-05 DIAGNOSIS — K30 Functional dyspepsia: Secondary | ICD-10-CM | POA: Insufficient documentation

## 2015-01-05 DIAGNOSIS — K7031 Alcoholic cirrhosis of liver with ascites: Secondary | ICD-10-CM

## 2015-01-05 DIAGNOSIS — R188 Other ascites: Secondary | ICD-10-CM

## 2015-01-05 DIAGNOSIS — C159 Malignant neoplasm of esophagus, unspecified: Secondary | ICD-10-CM | POA: Insufficient documentation

## 2015-01-05 HISTORY — DX: Reserved for inherently not codable concepts without codable children: IMO0001

## 2015-01-05 NOTE — Consult Note (Signed)
Chief Complaint: Patient was seen in consultation today for refractory ascites at the request of Edwards,James  Referring Physician(s): Edwards,James   History of Present Illness: [name] is a 69 y.o. male who presents for evaluation of medically refractory ascites at the kind request of Dr. Laurence Spates.  Thaddius Kight has a history of alcoholic cirrhosis. Approximately one year ago he began to develop large volume ascites and was initially treated with several paracenteses as diuretic therapy was initiated. After appropriate medical titration through Dr. Oletta Lamas' office, his ascites was kept under excellent control for nearly a year. However, recently it has been noted that his electrolytes are imbalanced and his creatinine is elevating. His diuretic dose was decreased which was shortly followed by interval weight gain and reaccumulation of ascites. A slight increase in his diuretic dose caused a relatively significant increase in his creatinine to 2.2. His renal function is now of concern. Furthermore, there is some concern that he could be developing hepatorenal syndrome.   3 weeks ago he was taken off all diuretics. Over the past 3 weeks he has noticed steady weight gain and increasing protuberance of his abdomen. His abdomen now feels tight and uncomfortable. He finds he is short of breath when laying flat or sitting.  He does continue to drink alcohol. He has one to several glasses of wine nearly every night.  He said approximately scratch that he notes that proximally one year ago he did stop drinking alcohol completely for about a month but notes that he really does enjoy his evening glass of wine.  He denies any known history of cardiac problems or congestive heart failure. He denies history of prior encephalopathy.  Past Medical History  Diagnosis Date  . Hypertension     under control  . History of cancer chemotherapy   . Hx of radiation therapy 2002  . Gout    under control  . Umbilical hernia   . Fatty liver     "under control, being monitored"  . Esophageal cancer 2002    esophagus removed  . Shortness of breath dyspnea     Past Surgical History  Procedure Laterality Date  . Esophagus surgery  2002  . Incisional hernia repair  2004  . Tonsillectomy  as child  . Umbilical hernia repair N/A 06/14/2014    Procedure:  OPEN UMBILICAL HERNIA REPAIR;  Surgeon: Pedro Earls, MD;  Location: [facility];  Service: General;  Laterality: N/A;    Allergies: Amoxicillin  Medications: Prior to Admission medications   Medication Sig Start Date End Date Taking? Authorizing Provider  allopurinol (ZYLOPRIM) 300 MG tablet Take 300 mg by mouth daily.  12/20/12  Yes Historical Provider, MD  atenolol (TENORMIN) 50 MG tablet Take 50 mg by mouth every morning.   Yes Historical Provider, MD  omeprazole (PRILOSEC) 20 MG capsule Take 20 mg by mouth daily.  01/18/13  Yes Historical Provider, MD  furosemide (LASIX) 40 MG tablet Take 40 mg by mouth daily.    Historical Provider, MD  spironolactone (ALDACTONE) 50 MG tablet Take 50 mg by mouth every morning.    Historical Provider, MD     Family History  Problem Relation Age of Onset  . Diabetes Father   . Diabetes Brother     Social History   Social History  . Marital Status: Married    Spouse Name: N/A  . Number of Children: N/A  . Years of Education: N/A   Social History Main Topics  .  Smoking status: Former Smoker -- 0.50 packs/day for 40 years    Types: Cigarettes    Quit date: 04/16/2004  . Smokeless tobacco: Never Used  . Alcohol Use: Yes     Comment: wine daily  . Drug Use: No  . Sexual Activity: Not Asked   Other Topics Concern  . None   Social History Narrative    Review of Systems: A 12 point ROS discussed and pertinent positives are indicated in the HPI above.  All other systems are negative.  Review of Systems  Vital Signs: BP 145/92 mmHg  Pulse 74  Temp(Src) 97.7 F (36.5 C)   Resp 20  Ht 6\' 2"  (1.88 m)  Wt 240 lb (108.863 kg)  BMI 30.80 kg/m2  SpO2 96%  Physical Exam  Constitutional: He is oriented to person, place, and time. He appears well-developed and well-nourished. No distress.  HENT:  Head: Normocephalic and atraumatic.  Eyes: No scleral icterus.  Cardiovascular: Normal rate and regular rhythm.   Pulmonary/Chest: Effort normal.  Abdominal: Soft. He exhibits distension. There is no tenderness.  Neurological: He is alert and oriented to person, place, and time.  Skin: Skin is warm and dry.  Psychiatric: He has a normal mood and affect. His behavior is normal.  Nursing note and vitals reviewed.   Mallampati Score:  2  Imaging: US Abdomen Complete  12/24/2014   CLINICAL DATA:  69 year old male with alcoholic cirrhosis, ascites and history of esophageal cancer  EXAM: ULTRASOUND ABDOMEN COMPLETE; LIVER DUPLEX US  COMPARISON:  Prior CT abdomen/pelvis 05/10/2014; Prior duplex liver evaluation 10/19/2013  FINDINGS: COMPLETE ABDOMEN US  Gallbladder: No gallstones or wall thickening visualized. No sonographic Murphy sign noted.  Common bile duct: Diameter: Within normal limits at 4 mm  Liver: Cirrhotic appearing liver. The hepatic contour is nodular. The hepatic parenchyma is echogenic, heterogeneous and coarsened. No discrete solid hepatic lesion is identified. There is a moderate volume of perihepatic ascites.  IVC: No abnormality visualized.  Pancreas: Visualized portion unremarkable.  Spleen: Size and appearance within normal limits.  Right Kidney: Length: 11.2 cm. Echogenicity within normal limits. No mass or hydronephrosis visualized.  Left Kidney: Length: 10.9 cm. Echogenicity within normal limits. Small sonographically simple 12 x 10 x 12 mm cyst. Echogenicity along the posterior wall consistent with reverberation artifact.  Abdominal aorta: No aneurysm visualized.  Other findings: Moderate volume peritoneal ascites.  DUPLEX DOPPLER LIVER US  Portal Vein  Velocities  Main:  18-19 cm/sec with normal hepatopetal directional flow.  Right:  19 cm/sec with normal hepatopetal directional flow.  Left:  19 cm/sec with normal hepatopetal directional flow.  Hepatic Vein Velocities  Right:  36 cm/sec  Middle:  45 cm/sec  Left:  35 cm/sec  Hepatic Artery Velocity: 47 cm/sec  Splenic Vein Velocity: 21 cm/sec  Varices: None identified  Ascites: Moderate ascites  IMPRESSION: 1. The main, left and right portal veins are patent with normal hepatopetal directional flow. However, the velocities are slow (less than 20 cm/sec) suggesting sluggish flow in the setting of portal hypertension. 2. Cirrhotic liver morphology. No evidence of discrete hepatic lesion or mass. 3. Moderate volume ascites. 4. Small simple left renal cyst.   Electronically Signed   By: Jacqulynn Cadet M.D.   On: 12/24/2014 10:52   [place] Art/ven Flow Abd Pelv Doppler  12/24/2014   CLINICAL DATA:  69 year old male with alcoholic cirrhosis, ascites and history of esophageal cancer  EXAM: ULTRASOUND ABDOMEN COMPLETE; LIVER DUPLEX US  COMPARISON:  Prior CT  abdomen/pelvis 05/10/2014; Prior duplex liver evaluation 10/19/2013  FINDINGS: COMPLETE ABDOMEN US  Gallbladder: No gallstones or wall thickening visualized. No sonographic Murphy sign noted.  Common bile duct: Diameter: Within normal limits at 4 mm  Liver: Cirrhotic appearing liver. The hepatic contour is nodular. The hepatic parenchyma is echogenic, heterogeneous and coarsened. No discrete solid hepatic lesion is identified. There is a moderate volume of perihepatic ascites.  IVC: No abnormality visualized.  Pancreas: Visualized portion unremarkable.  Spleen: Size and appearance within normal limits.  Right Kidney: Length: 11.2 cm. Echogenicity within normal limits. No mass or hydronephrosis visualized.  Left Kidney: Length: 10.9 cm. Echogenicity within normal limits. Small sonographically simple 12 x 10 x 12 mm cyst. Echogenicity along the posterior wall consistent  with reverberation artifact.  Abdominal aorta: No aneurysm visualized.  Other findings: Moderate volume peritoneal ascites.  DUPLEX DOPPLER LIVER US  Portal Vein Velocities  Main:  18-19 cm/sec with normal hepatopetal directional flow.  Right:  19 cm/sec with normal hepatopetal directional flow.  Left:  19 cm/sec with normal hepatopetal directional flow.  Hepatic Vein Velocities  Right:  36 cm/sec  Middle:  45 cm/sec  Left:  35 cm/sec  Hepatic Artery Velocity: 47 cm/sec  Splenic Vein Velocity: 21 cm/sec  Varices: None identified  Ascites: Moderate ascites  IMPRESSION: 1. The main, left and right portal veins are patent with normal hepatopetal directional flow. However, the velocities are slow (less than 20 cm/sec) suggesting sluggish flow in the setting of portal hypertension. 2. Cirrhotic liver morphology. No evidence of discrete hepatic lesion or mass. 3. Moderate volume ascites. 4. Small simple left renal cyst.   Electronically Signed   By: Jacqulynn Cadet M.D.   On: 12/24/2014 10:52    Labs:  CBC:  Recent Labs  06/08/14 1030 06/14/14 1045 06/14/14 1416 06/15/14 0535  WBC 4.9  --  4.5 4.2  HGB 12.2* 12.2* 11.2* 11.2*  HCT 36.0* 36.0* 33.4* 33.6*  PLT 168  --  147* 136*    COAGS: No results for input(s): INR, APTT in the last 8760 hours.  BMP:  Recent Labs  06/08/14 1030 06/14/14 1045 06/14/14 1416 06/15/14 0535  NA 132* 133*  --  129*  K 6.1* 4.6  --  5.3*  CL 98  --   --  97  CO2 24  --   --  23  GLUCOSE 106* 96  --  106*  BUN 22  --   --  21  CALCIUM 9.6  --   --  8.8  CREATININE 1.32  --  1.27 1.20  GFRNONAA 53*  --  56* 60*  GFRAA 61*  --  64* 69*    LIVER FUNCTION TESTS:  Recent Labs  06/08/14 1030  BILITOT 0.9  AST 29  ALT 12  ALKPHOS 116  PROT 7.9  ALBUMIN 4.3    TUMOR MARKERS: No results for input(s): AFPTM, CEA, CA199, CHROMGRNA in the last 8760 hours.  Assessment and Plan:  Very pleasant 69 year old male with alcoholic cirrhosis, acute by  large volume ascites. His ascites was previously well controlled with diuretics, however he has begun to develop renal insufficiency and sensitivity to the diuretics and can no longer continue medical management.  Labs drawn in Dr. Oletta Lamas office on 12/23/2014 include a total bilirubin of 0.8, albumin of 4.0, sodium 130 and creatinine of 1.53. Unless his INR is significantly elevated out of proportion to the remainder of his hepatic function, his MELD score should be well below  the dangers zone of 18.  He is currently a Marcello Moores class B cirrhotic.  I discussed the natural history of hepatic cirrhosis, ascites and had hepatorenal syndrome with Mr. Dimond. We discussed the treatment options including elective creation of a TIPS as well as placement of a [place] shunt. He understands that he is relatively low risk for encephalopathy given an absence of prior history of encephalopathy. If his liver function is sufficient to tolerate a TIPS, this would likely be his best treatment option as it would address not only the ascites but also potentially improve any underlying hepatorenal syndrome.  Our current plan is to pursue complete workup including fresh liver labs with INR and echocardiography to confirm adequate right heart function. His duplex ultrasound today demonstrates patency of the portal vein and a moderate to large volume of ascites as expected. I do believe he will be a good candidate for elective TIPS. Currently, he is quite uncomfortable secondary to his large volume ascites. We will get him scheduled for an outpatient paracentesis to get him through while we pursue a full workup.  1.) CBC, CMP, INR to calculate current MELD  2.) ECHO to evaluate ejection fraction.   3.) Out-patient ultrasound-guided paracentesis to be scheduled at [facility] or [facility] later this week.  4.) Return clinic visit on October 4 to go over the results of the above evaluation. At that time, we will  have a more thorough discussion on his potential candidacy for elective TIPS creation.  Thank you for this interesting consult.  I greatly enjoyed meeting Elease Etienne and look forward to participating in their care.  A copy of this report was sent to the requesting provider on this date.  SignedJacqulynn Cadet 01/05/2015, 2:39 PM   I spent a total of  40 Minutes in face to face in clinical consultation, greater than 50% of which was counseling/coordinating care for cirrhosis with medically refractory ascites.

## 2015-01-06 ENCOUNTER — Other Ambulatory Visit (HOSPITAL_COMMUNITY): Payer: Self-pay | Admitting: Interventional Radiology

## 2015-01-06 DIAGNOSIS — Z01818 Encounter for other preprocedural examination: Secondary | ICD-10-CM

## 2015-01-07 ENCOUNTER — Ambulatory Visit (HOSPITAL_COMMUNITY)
Admission: RE | Admit: 2015-01-07 | Discharge: 2015-01-07 | Disposition: A | Discharge status: Home / Self Care | Payer: Medicare Other | Source: Ambulatory Visit | Attending: Interventional Radiology | Admitting: Interventional Radiology

## 2015-01-07 ENCOUNTER — Ambulatory Visit (HOSPITAL_BASED_OUTPATIENT_CLINIC_OR_DEPARTMENT_OTHER)
Admission: RE | Admit: 2015-01-07 | Discharge: 2015-01-07 | Disposition: A | Discharge status: Home / Self Care | Payer: Medicare Other | Source: Ambulatory Visit | Attending: Interventional Radiology | Admitting: Interventional Radiology

## 2015-01-07 DIAGNOSIS — R140 Abdominal distension (gaseous): Secondary | ICD-10-CM | POA: Diagnosis not present

## 2015-01-07 DIAGNOSIS — I5189 Other ill-defined heart diseases: Secondary | ICD-10-CM | POA: Insufficient documentation

## 2015-01-07 DIAGNOSIS — I77810 Thoracic aortic ectasia: Secondary | ICD-10-CM | POA: Diagnosis not present

## 2015-01-07 DIAGNOSIS — R188 Other ascites: Secondary | ICD-10-CM | POA: Insufficient documentation

## 2015-01-07 DIAGNOSIS — I351 Nonrheumatic aortic (valve) insufficiency: Secondary | ICD-10-CM | POA: Insufficient documentation

## 2015-01-07 DIAGNOSIS — K7031 Alcoholic cirrhosis of liver with ascites: Secondary | ICD-10-CM

## 2015-01-07 DIAGNOSIS — I071 Rheumatic tricuspid insufficiency: Secondary | ICD-10-CM | POA: Diagnosis not present

## 2015-01-07 DIAGNOSIS — Z8501 Personal history of malignant neoplasm of esophagus: Secondary | ICD-10-CM | POA: Diagnosis not present

## 2015-01-07 DIAGNOSIS — I371 Nonrheumatic pulmonary valve insufficiency: Secondary | ICD-10-CM | POA: Diagnosis not present

## 2015-01-07 DIAGNOSIS — I517 Cardiomegaly: Secondary | ICD-10-CM | POA: Diagnosis not present

## 2015-01-07 DIAGNOSIS — Z01818 Encounter for other preprocedural examination: Secondary | ICD-10-CM | POA: Diagnosis not present

## 2015-01-07 DIAGNOSIS — Z01810 Encounter for preprocedural cardiovascular examination: Secondary | ICD-10-CM | POA: Diagnosis not present

## 2015-01-07 DIAGNOSIS — I340 Nonrheumatic mitral (valve) insufficiency: Secondary | ICD-10-CM | POA: Diagnosis not present

## 2015-01-07 NOTE — Procedures (Signed)
Successful US guided paracentesis from [facility].  Yielded 8.7 of clear yellow fluid.  No immediate complications.  Pt tolerated well.   Specimen was not sent for labs.  Hobert Poplaski S Hartman Minahan PA-C 01/07/2015 3:21 PM

## 2015-01-07 NOTE — Progress Notes (Signed)
  Echocardiogram 2D Echocardiogram has been performed.  Darlina Sicilian M 01/07/2015, 12:46 PM

## 2015-01-18 ENCOUNTER — Ambulatory Visit
Admission: RE | Admit: 2015-01-18 | Discharge: 2015-01-18 | Disposition: A | Discharge status: Home / Self Care | Payer: Medicare Other | Source: Ambulatory Visit | Attending: Interventional Radiology | Admitting: Interventional Radiology

## 2015-01-18 DIAGNOSIS — Z95828 Presence of other vascular implants and grafts: Secondary | ICD-10-CM

## 2015-01-18 NOTE — Progress Notes (Signed)
Chief Complaint: Patient was seen in consultation today for  Chief Complaint  Patient presents with  . Follow-up    for possible TIPS     at the request of Laurence Spates.  Referring Physician(s): Laurence Spates  History of Present Illness: [name] is a 69 y.o. male with alcoholic cirrhosis complicated by recurrent large volume ascites. While he was maintained for over a year on diuretics, he has recently developed significant renal insufficiency and can no longer be adequately managed with diuretics. He was seen in consultation for possible TIPS versus [place] shunt and returns following completion of his workup for TIPS candidacy.  His hepatic reserve is good. His MELD today is 11. He is a Marcello Moores class B cirrhotic. His echocardiogram demonstrated excellent heart function with a normal ejection fraction. He has no prior history of encephalopathy.  He underwent paracentesis on 01/07/2015 with removal of nearly 9 L of fluid. He has felt remarkably improved since that large volume paracentesis.  He is a good candidate for elective TIPS. We again discussed the risks, benefits and alternative to TIPS. He understands that there is some risk of encephalopathy and liver dysfunction although he falls into the low risk category for both having had no prior history of encephalopathy and with good hepatic reserve. He understands and desires to proceed with TIPS.   Past Medical History  Diagnosis Date  . Hypertension     under control  . History of cancer chemotherapy   . Hx of radiation therapy 2002  . Gout     under control  . Umbilical hernia   . Fatty liver     "under control, being monitored"  . Esophageal cancer 2002    esophagus removed  . Shortness of breath dyspnea     Past Surgical History  Procedure Laterality Date  . Esophagus surgery  2002  . Incisional hernia repair  2004  . Tonsillectomy  as child  . Umbilical hernia repair N/A 06/14/2014    Procedure:   OPEN UMBILICAL HERNIA REPAIR;  Surgeon: Pedro Earls, MD;  Location: [facility];  Service: General;  Laterality: N/A;    Allergies: Amoxicillin  Medications: Prior to Admission medications   Medication Sig Start Date End Date Taking? Authorizing Provider  allopurinol (ZYLOPRIM) 300 MG tablet Take 300 mg by mouth daily.  12/20/12  Yes Historical Provider, MD  atenolol (TENORMIN) 50 MG tablet Take 50 mg by mouth every morning.   Yes Historical Provider, MD  omeprazole (PRILOSEC) 20 MG capsule Take 20 mg by mouth daily.  01/18/13  Yes Historical Provider, MD  furosemide (LASIX) 40 MG tablet Take 40 mg by mouth daily.    Historical Provider, MD  spironolactone (ALDACTONE) 50 MG tablet Take 50 mg by mouth every morning.    Historical Provider, MD     Family History  Problem Relation Age of Onset  . Diabetes Father   . Diabetes Brother     Social History   Social History  . Marital Status: Married    Spouse Name: N/A  . Number of Children: N/A  . Years of Education: N/A   Social History Main Topics  . Smoking status: Former Smoker -- 0.50 packs/day for 40 years    Types: Cigarettes    Quit date: 04/16/2004  . Smokeless tobacco: Never Used  . Alcohol Use: Yes     Comment: wine daily  . Drug Use: No  . Sexual Activity: Not on file   Other  Topics Concern  . Not on file   Social History Narrative    Review of Systems: A 12 point ROS discussed and pertinent positives are indicated in the HPI above.  All other systems are negative.  Review of Systems  Vital Signs: BP 131/79 mmHg  Pulse 62  Temp(Src) 97.8 F (36.6 C) (Oral)  Resp 62  Ht 6\' 2"  (1.88 m)  Wt 236 lb 6.4 oz (107.23 kg)  BMI 30.34 kg/m2  SpO2 100%  Physical Exam  Constitutional: He is oriented to person, place, and time. He appears well-developed and well-nourished. No distress.  HENT:  Head: Normocephalic and atraumatic.  Eyes: No scleral icterus.  Cardiovascular: Normal rate and regular rhythm.     Pulmonary/Chest: Effort normal.  Abdominal: Soft. He exhibits distension. There is no tenderness.  Neurological: He is alert and oriented to person, place, and time.  Skin: Skin is warm and dry.  Psychiatric: He has a normal mood and affect. His behavior is normal.  Nursing note and vitals reviewed.   Mallampati Score:  2  Imaging: US Abdomen Complete  12/24/2014   CLINICAL DATA:  69 year old male with alcoholic cirrhosis, ascites and history of esophageal cancer  EXAM: ULTRASOUND ABDOMEN COMPLETE; LIVER DUPLEX US  COMPARISON:  Prior CT abdomen/pelvis 05/10/2014; Prior duplex liver evaluation 10/19/2013  FINDINGS: COMPLETE ABDOMEN US  Gallbladder: No gallstones or wall thickening visualized. No sonographic Murphy sign noted.  Common bile duct: Diameter: Within normal limits at 4 mm  Liver: Cirrhotic appearing liver. The hepatic contour is nodular. The hepatic parenchyma is echogenic, heterogeneous and coarsened. No discrete solid hepatic lesion is identified. There is a moderate volume of perihepatic ascites.  IVC: No abnormality visualized.  Pancreas: Visualized portion unremarkable.  Spleen: Size and appearance within normal limits.  Right Kidney: Length: 11.2 cm. Echogenicity within normal limits. No mass or hydronephrosis visualized.  Left Kidney: Length: 10.9 cm. Echogenicity within normal limits. Small sonographically simple 12 x 10 x 12 mm cyst. Echogenicity along the posterior wall consistent with reverberation artifact.  Abdominal aorta: No aneurysm visualized.  Other findings: Moderate volume peritoneal ascites.  DUPLEX DOPPLER LIVER US  Portal Vein Velocities  Main:  18-19 cm/sec with normal hepatopetal directional flow.  Right:  19 cm/sec with normal hepatopetal directional flow.  Left:  19 cm/sec with normal hepatopetal directional flow.  Hepatic Vein Velocities  Right:  36 cm/sec  Middle:  45 cm/sec  Left:  35 cm/sec  Hepatic Artery Velocity: 47 cm/sec  Splenic Vein Velocity: 21 cm/sec   Varices: None identified  Ascites: Moderate ascites  IMPRESSION: 1. The main, left and right portal veins are patent with normal hepatopetal directional flow. However, the velocities are slow (less than 20 cm/sec) suggesting sluggish flow in the setting of portal hypertension. 2. Cirrhotic liver morphology. No evidence of discrete hepatic lesion or mass. 3. Moderate volume ascites. 4. Small simple left renal cyst.   Electronically Signed   By: Jacqulynn Cadet M.D.   On: 12/24/2014 10:52   [place] Art/ven Flow Abd Pelv Doppler  12/24/2014   CLINICAL DATA:  69 year old male with alcoholic cirrhosis, ascites and history of esophageal cancer  EXAM: ULTRASOUND ABDOMEN COMPLETE; LIVER DUPLEX US  COMPARISON:  Prior CT abdomen/pelvis 05/10/2014; Prior duplex liver evaluation 10/19/2013  FINDINGS: COMPLETE ABDOMEN US  Gallbladder: No gallstones or wall thickening visualized. No sonographic Murphy sign noted.  Common bile duct: Diameter: Within normal limits at 4 mm  Liver: Cirrhotic appearing liver. The hepatic contour is nodular. The  hepatic parenchyma is echogenic, heterogeneous and coarsened. No discrete solid hepatic lesion is identified. There is a moderate volume of perihepatic ascites.  IVC: No abnormality visualized.  Pancreas: Visualized portion unremarkable.  Spleen: Size and appearance within normal limits.  Right Kidney: Length: 11.2 cm. Echogenicity within normal limits. No mass or hydronephrosis visualized.  Left Kidney: Length: 10.9 cm. Echogenicity within normal limits. Small sonographically simple 12 x 10 x 12 mm cyst. Echogenicity along the posterior wall consistent with reverberation artifact.  Abdominal aorta: No aneurysm visualized.  Other findings: Moderate volume peritoneal ascites.  DUPLEX DOPPLER LIVER US  Portal Vein Velocities  Main:  18-19 cm/sec with normal hepatopetal directional flow.  Right:  19 cm/sec with normal hepatopetal directional flow.  Left:  19 cm/sec with normal hepatopetal  directional flow.  Hepatic Vein Velocities  Right:  36 cm/sec  Middle:  45 cm/sec  Left:  35 cm/sec  Hepatic Artery Velocity: 47 cm/sec  Splenic Vein Velocity: 21 cm/sec  Varices: None identified  Ascites: Moderate ascites  IMPRESSION: 1. The main, left and right portal veins are patent with normal hepatopetal directional flow. However, the velocities are slow (less than 20 cm/sec) suggesting sluggish flow in the setting of portal hypertension. 2. Cirrhotic liver morphology. No evidence of discrete hepatic lesion or mass. 3. Moderate volume ascites. 4. Small simple left renal cyst.   Electronically Signed   By: Jacqulynn Cadet M.D.   On: 12/24/2014 10:52   US Paracentesis  01/07/2015   CLINICAL DATA:  History of esophageal carcinoma. Abdominal distention and ascites. Request for therapeutic paracentesis  EXAM: ULTRASOUND GUIDED LEFT LOWER QUADRANT PARACENTESIS  COMPARISON:  None.  PROCEDURE: An ultrasound guided paracentesis was thoroughly discussed with the patient and questions answered. The benefits, risks, alternatives and complications were also discussed. The patient understands and wishes to proceed with the procedure. Written consent was obtained.  Ultrasound was performed to localize and mark an adequate pocket of fluid in the left lower quadrant of the abdomen. The area was then prepped and draped in the normal sterile fashion. 1% Lidocaine was used for local anesthesia. Under ultrasound guidance a 19 gauge Yueh catheter was introduced. Paracentesis was performed. The catheter was removed and a dressing applied.  COMPLICATIONS: None.  FINDINGS: A total of approximately 8.7 liters of clear yellow fluid was removed. A fluid sample was not sent for laboratory analysis.  IMPRESSION: Successful ultrasound guided paracentesis yielding 8.7 liters of ascites.  Read by:  Gareth Eagle, PA-C   Electronically Signed   By: Sandi Mariscal M.D.   On: 01/07/2015 15:21    Labs:  CBC:  Recent Labs  06/08/14 1030  06/14/14 1045 06/14/14 1416 06/15/14 0535  WBC 4.9  --  4.5 4.2  HGB 12.2* 12.2* 11.2* 11.2*  HCT 36.0* 36.0* 33.4* 33.6*  PLT 168  --  147* 136*    COAGS: No results for input(s): INR, APTT in the last 8760 hours.  BMP:  Recent Labs  06/08/14 1030 06/14/14 1045 06/14/14 1416 06/15/14 0535  NA 132* 133*  --  129*  K 6.1* 4.6  --  5.3*  CL 98  --   --  97  CO2 24  --   --  23  GLUCOSE 106* 96  --  106*  BUN 22  --   --  21  CALCIUM 9.6  --   --  8.8  CREATININE 1.32  --  1.27 1.20  GFRNONAA 53*  --  56* 60*  GFRAA 61*  --  64* 69*    LIVER FUNCTION TESTS:  Recent Labs  06/08/14 1030  BILITOT 0.9  AST 29  ALT 12  ALKPHOS 116  PROT 7.9  ALBUMIN 4.3    TUMOR MARKERS: No results for input(s): AFPTM, CEA, CA199, CHROMGRNA in the last 8760 hours.  Assessment and Plan:  Completed TIPS workup reveals that [name] is a good candidate for elective TIPS creation. His hepatic reserve is excellent with a current meld of 11. His echocardiogram demonstrates a normal ejection fraction and he has no prior history of encephalopathy.  Furthermore, he feels remarkably better after his last large volume paracentesis at which time nearly 9 L of fluid was removed.  1.) Schedule for outpatient TIPS with anesthesia to be performed at [facility] as soon as possible.  2.) With our current schedule, this may not be scheduled for up to 4-6 weeks. [name] may very well developed recurrent large volume ascites. He is to call our office if that occurs and we will schedule him for an outpatient paracentesis if needed. If he does undergo paracentesis, he should receive albumin at that time.  Thank you for this interesting consult.  I greatly enjoyed meeting [name] and look forward to participating in their care.  A copy of this report was sent to the requesting provider on this date.  SignedJacqulynn Cadet 01/18/2015, 3:26 PM   I spent a total of  10 Minutes  in face to face in clinical consultation, greater than 50% of which was counseling/coordinating care for cirrhosis with ascites.

## 2015-01-21 ENCOUNTER — Other Ambulatory Visit (HOSPITAL_COMMUNITY): Payer: Self-pay | Admitting: Interventional Radiology

## 2015-01-21 DIAGNOSIS — K7031 Alcoholic cirrhosis of liver with ascites: Secondary | ICD-10-CM

## 2015-02-04 ENCOUNTER — Encounter (HOSPITAL_COMMUNITY)
Admission: RE | Admit: 2015-02-04 | Discharge: 2015-02-04 | Disposition: A | Discharge status: Home / Self Care | Payer: Medicare Other | Source: Ambulatory Visit | Attending: Interventional Radiology | Admitting: Interventional Radiology

## 2015-02-04 ENCOUNTER — Encounter (HOSPITAL_COMMUNITY): Payer: Self-pay

## 2015-02-04 DIAGNOSIS — Z01812 Encounter for preprocedural laboratory examination: Secondary | ICD-10-CM | POA: Insufficient documentation

## 2015-02-04 DIAGNOSIS — Z8501 Personal history of malignant neoplasm of esophagus: Secondary | ICD-10-CM | POA: Insufficient documentation

## 2015-02-04 DIAGNOSIS — Z01818 Encounter for other preprocedural examination: Secondary | ICD-10-CM | POA: Insufficient documentation

## 2015-02-04 DIAGNOSIS — K7030 Alcoholic cirrhosis of liver without ascites: Secondary | ICD-10-CM | POA: Diagnosis not present

## 2015-02-04 DIAGNOSIS — Z79899 Other long term (current) drug therapy: Secondary | ICD-10-CM | POA: Insufficient documentation

## 2015-02-04 DIAGNOSIS — K219 Gastro-esophageal reflux disease without esophagitis: Secondary | ICD-10-CM | POA: Diagnosis not present

## 2015-02-04 DIAGNOSIS — I10 Essential (primary) hypertension: Secondary | ICD-10-CM | POA: Insufficient documentation

## 2015-02-04 DIAGNOSIS — Z87891 Personal history of nicotine dependence: Secondary | ICD-10-CM | POA: Insufficient documentation

## 2015-02-04 HISTORY — DX: Alcoholic cirrhosis of liver without ascites: K70.30

## 2015-02-04 LAB — COMPREHENSIVE METABOLIC PANEL
ALK PHOS: 168 U/L — AB (ref 38–126)
ALT: 11 U/L — AB (ref 17–63)
ANION GAP: 11 (ref 5–15)
AST: 24 U/L (ref 15–41)
Albumin: 3.3 g/dL — ABNORMAL LOW (ref 3.5–5.0)
BILIRUBIN TOTAL: 0.7 mg/dL (ref 0.3–1.2)
BUN: 11 mg/dL (ref 6–20)
CALCIUM: 8.7 mg/dL — AB (ref 8.9–10.3)
CO2: 23 mmol/L (ref 22–32)
CREATININE: 1.37 mg/dL — AB (ref 0.61–1.24)
Chloride: 97 mmol/L — ABNORMAL LOW (ref 101–111)
GFR, EST AFRICAN AMERICAN: 58 mL/min — AB (ref >60)
GFR, EST NON AFRICAN AMERICAN: 50 mL/min — AB (ref >60)
Glucose, Bld: 119 mg/dL — ABNORMAL HIGH (ref 65–99)
Potassium: 4.2 mmol/L (ref 3.5–5.1)
Sodium: 131 mmol/L — ABNORMAL LOW (ref 135–145)
TOTAL PROTEIN: 6.7 g/dL (ref 6.5–8.1)

## 2015-02-04 LAB — CBC
HCT: 32.1 % — ABNORMAL LOW (ref 39.0–52.0)
HEMOGLOBIN: 10.7 g/dL — AB (ref 13.0–17.0)
MCH: 35.9 pg — ABNORMAL HIGH (ref 26.0–34.0)
MCHC: 33.3 g/dL (ref 30.0–36.0)
MCV: 107.7 fL — ABNORMAL HIGH (ref 78.0–100.0)
PLATELETS: 158 10*3/uL (ref 150–400)
RBC: 2.98 MIL/uL — AB (ref 4.22–5.81)
RDW: 14.5 % (ref 11.5–15.5)
WBC: 4.5 10*3/uL (ref 4.0–10.5)

## 2015-02-04 NOTE — Progress Notes (Signed)
   02/04/15 1258  OBSTRUCTIVE SLEEP APNEA  Have you ever been diagnosed with sleep apnea through a sleep study? No  Do you snore loudly (loud enough to be heard through closed doors)?  1  Do you often feel tired, fatigued, or sleepy during the daytime (such as falling asleep during driving or talking to someone)? 0  Has anyone observed you stop breathing during your sleep? 0  Do you have, or are you being treated for high blood pressure? 1  BMI more than 35 kg/m2? 0  Age > 50 (1-yes) 1  Neck circumference greater than:Male 16 inches or larger, Male 17inches or larger? 1  Male Gender (Yes=1) 1  Obstructive Sleep Apnea Score 5  Score 5 or greater  Results sent to PCP

## 2015-02-04 NOTE — Pre-Procedure Instructions (Signed)
    Landry Buchinger  02/04/2015      [id number] - [place], [place] - [address] [place] [address] Phone: [phone] Fax: [phone]    Your procedure is scheduled on Wednesday, October 26th, 2016.  Report to [facility] Admitting at 8:45 A.M.  Call this number if you have problems the morning of surgery:  [phone]   Remember:  Do not eat food or drink liquids after midnight.   Take these medicines the morning of surgery with A SIP OF WATER: Allopurinol (Zyloprim), Atenolol (Tenormin), Omeprazole (Prilosec).   Stop taking: Aspirin, NSAIDS, Ibuprofen, Advil, Motrin, BC's, Goody's, Fish oil, all herbal medications, and all vitamins.    Do not wear jewelry.  Do not wear lotions, powders, or colognes.  You may wear deodorant.  Men may shave face and neck.  Do not bring valuables to the hospital.  [facility] is not responsible for any belongings or valuables.  Contacts, dentures or bridgework may not be worn into surgery.  Leave your suitcase in the car.  After surgery it may be brought to your room.  For patients admitted to the hospital, discharge time will be determined by your treatment team.  Patients discharged the day of surgery will not be allowed to drive home.   Special instructions:  See attached.   Please read over the following fact sheets that you were given. Pain Booklet, Coughing and Deep Breathing and Surgical Site Infection Prevention

## 2015-02-04 NOTE — Progress Notes (Signed)
PCP is Dr Anastasia Pall Denies seeing a cardiologist Denies ever having a card cath. Echo noted in  [place] from 01-07-2015 Reports he had a stress test 20 years ago.

## 2015-02-07 ENCOUNTER — Encounter (HOSPITAL_COMMUNITY): Payer: Self-pay

## 2015-02-07 NOTE — Progress Notes (Signed)
Anesthesia Chart Review:  Pt is 69 year old male scheduled for TIPS on 02/09/2015 with Dr. Jacqulynn Cadet.   PCP is Dr. Anastasia Pall.   PMH includes: alcoholic cirrhosis, HTN, fatty liver, esophageal cancer (s/p esophagectomy), GERD. Former smoker. BMI 30. Drinks wine daily. S/p open umbilical hernia repair 9/52/84.   Medications include: atenolol, prilosec.   Preoperative labs reviewed.    EKG 06/08/2014: Sinus bradycardia (54 bpm). Cannot rule out Anterior infarct, age undetermined. Appears stable when compared to prior tracing dated 01/31/2007.   Echo 01/07/2015:  - Left ventricle: The cavity size was normal. There was mild focal basal hypertrophy of the septum. Systolic function was normal. The estimated ejection fraction was in the range of 55% to 60%. Wall motion was normal; there were no regional wall motion abnormalities. Features are consistent with a pseudonormal left ventricular filling pattern, with concomitant abnormal relaxation and increased filling pressure (grade 2 diastolic dysfunction). - Aortic valve: There was mild regurgitation. - Aortic root: The aortic root was mildly dilated. - Impressions: Normal LV function; grade 2 diastolic dysfunction; mild AI;mildly dilated aortic root; trace MR; mild TR; cannot R/O mass posterior to the heart and there appears to be possible ascites; suggest chest and upper abdominal CT to further assess.  Pt tolerated open umbilical hernia repair in February. EKG stable. If no changes, I anticipate pt can proceed with surgery as scheduled.   Willeen Cass, FNP-BC [facility]/Anesthesiology Phone: [phone] 02/07/2015 1:19 PM

## 2015-02-08 ENCOUNTER — Other Ambulatory Visit: Payer: Self-pay | Admitting: Physician Assistant

## 2015-02-08 ENCOUNTER — Encounter (HOSPITAL_COMMUNITY): Payer: Self-pay | Admitting: Certified Registered Nurse Anesthetist

## 2015-02-09 ENCOUNTER — Encounter (HOSPITAL_COMMUNITY): Payer: Self-pay | Admitting: *Deleted

## 2015-02-09 ENCOUNTER — Encounter (HOSPITAL_COMMUNITY)
Admission: RE | Disposition: A | Discharge status: Home / Self Care | Payer: Self-pay | Source: Ambulatory Visit | Attending: Interventional Radiology

## 2015-02-09 ENCOUNTER — Ambulatory Visit (HOSPITAL_COMMUNITY)
Admission: RE | Admit: 2015-02-09 | Discharge: 2015-02-09 | Disposition: A | Discharge status: Home / Self Care | Payer: Medicare Other | Source: Ambulatory Visit | Attending: Interventional Radiology | Admitting: Interventional Radiology

## 2015-02-09 ENCOUNTER — Ambulatory Visit (HOSPITAL_COMMUNITY): Payer: Medicare Other | Admitting: Emergency Medicine

## 2015-02-09 ENCOUNTER — Ambulatory Visit (HOSPITAL_COMMUNITY): Payer: Medicare Other | Admitting: Certified Registered Nurse Anesthetist

## 2015-02-09 ENCOUNTER — Observation Stay (HOSPITAL_COMMUNITY)
Admission: RE | Admit: 2015-02-09 | Discharge: 2015-02-10 | Disposition: A | Discharge status: Home / Self Care | Payer: Medicare Other | Source: Ambulatory Visit | Attending: Interventional Radiology | Admitting: Interventional Radiology

## 2015-02-09 DIAGNOSIS — R188 Other ascites: Secondary | ICD-10-CM

## 2015-02-09 DIAGNOSIS — Z95828 Presence of other vascular implants and grafts: Secondary | ICD-10-CM

## 2015-02-09 DIAGNOSIS — K7031 Alcoholic cirrhosis of liver with ascites: Principal | ICD-10-CM | POA: Insufficient documentation

## 2015-02-09 DIAGNOSIS — I10 Essential (primary) hypertension: Secondary | ICD-10-CM | POA: Insufficient documentation

## 2015-02-09 DIAGNOSIS — Z87891 Personal history of nicotine dependence: Secondary | ICD-10-CM | POA: Diagnosis not present

## 2015-02-09 DIAGNOSIS — K219 Gastro-esophageal reflux disease without esophagitis: Secondary | ICD-10-CM | POA: Insufficient documentation

## 2015-02-09 HISTORY — PX: RADIOLOGY WITH ANESTHESIA: SHX6223

## 2015-02-09 LAB — COMPREHENSIVE METABOLIC PANEL
ALBUMIN: 3.3 g/dL — AB (ref 3.5–5.0)
ALK PHOS: 136 U/L — AB (ref 38–126)
ALT: 8 U/L — AB (ref 17–63)
ANION GAP: 13 (ref 5–15)
AST: 26 U/L (ref 15–41)
BILIRUBIN TOTAL: 0.5 mg/dL (ref 0.3–1.2)
BUN: 12 mg/dL (ref 6–20)
CHLORIDE: 100 mmol/L — AB (ref 101–111)
CO2: 21 mmol/L — AB (ref 22–32)
CREATININE: 1.21 mg/dL (ref 0.61–1.24)
Calcium: 8.8 mg/dL — ABNORMAL LOW (ref 8.9–10.3)
GFR calc Af Amer: >60 mL/min (ref >60)
GFR calc non Af Amer: 58 mL/min — ABNORMAL LOW (ref >60)
Glucose, Bld: 92 mg/dL (ref 65–99)
Potassium: 4.2 mmol/L (ref 3.5–5.1)
Sodium: 134 mmol/L — ABNORMAL LOW (ref 135–145)
Total Protein: 6.8 g/dL (ref 6.5–8.1)

## 2015-02-09 LAB — APTT: aPTT: 37 seconds (ref 24–37)

## 2015-02-09 LAB — PROTIME-INR
INR: 1.11 (ref 0.00–1.49)
Prothrombin Time: 14.5 seconds (ref 11.6–15.2)

## 2015-02-09 SURGERY — RADIOLOGY WITH ANESTHESIA
Anesthesia: General

## 2015-02-09 MED ORDER — PHENYLEPHRINE HCL 10 MG/ML IJ SOLN
INTRAMUSCULAR | Status: DC | PRN
  Administered 2015-02-09 (×2): 80 ug via INTRAVENOUS

## 2015-02-09 MED ORDER — LACTATED RINGERS IV SOLN
INTRAVENOUS | Status: DC
  Administered 2015-02-09: 10:00:00 via INTRAVENOUS

## 2015-02-09 MED ORDER — ONDANSETRON HCL 4 MG/2ML IJ SOLN: 4.0000 mg | Freq: Once | INTRAMUSCULAR | Status: DC | PRN

## 2015-02-09 MED ORDER — SUGAMMADEX SODIUM 200 MG/2ML IV SOLN
INTRAVENOUS | Status: DC | PRN
  Administered 2015-02-09: 212.2 mg via INTRAVENOUS

## 2015-02-09 MED ORDER — IOHEXOL 300 MG/ML  SOLN
200.0000 mL | Freq: Once | INTRAMUSCULAR | Status: DC | PRN
  Administered 2015-02-09: 100 mL via INTRAVENOUS
  Filled 2015-02-09: qty 200

## 2015-02-09 MED ORDER — MIDAZOLAM HCL 5 MG/5ML IJ SOLN
INTRAMUSCULAR | Status: DC | PRN
  Administered 2015-02-09: 2 mg via INTRAVENOUS

## 2015-02-09 MED ORDER — SODIUM CHLORIDE 0.9 % IV SOLN
INTRAVENOUS | Status: AC
  Administered 2015-02-09: 21:00:00 via INTRAVENOUS

## 2015-02-09 MED ORDER — FENTANYL CITRATE (PF) 100 MCG/2ML IJ SOLN
INTRAMUSCULAR | Status: DC | PRN
  Administered 2015-02-09: 150 ug via INTRAVENOUS

## 2015-02-09 MED ORDER — VANCOMYCIN HCL IN DEXTROSE 1-5 GM/200ML-% IV SOLN
1000.0000 mg | Freq: Once | INTRAVENOUS | Status: AC
  Administered 2015-02-09: 1000 mg via INTRAVENOUS
  Filled 2015-02-09: qty 200

## 2015-02-09 MED ORDER — FENTANYL CITRATE (PF) 100 MCG/2ML IJ SOLN
INTRAMUSCULAR | Status: AC
  Filled 2015-02-09: qty 2

## 2015-02-09 MED ORDER — ROCURONIUM BROMIDE 100 MG/10ML IV SOLN
INTRAVENOUS | Status: DC | PRN
  Administered 2015-02-09: 40 mg via INTRAVENOUS

## 2015-02-09 MED ORDER — DEXTROSE 5 % IV SOLN
10.0000 mg | INTRAVENOUS | Status: DC | PRN
  Administered 2015-02-09: 40 ug/min via INTRAVENOUS

## 2015-02-09 MED ORDER — LIDOCAINE HCL (CARDIAC) 20 MG/ML IV SOLN
INTRAVENOUS | Status: DC | PRN
  Administered 2015-02-09: 70 mg via INTRAVENOUS

## 2015-02-09 MED ORDER — GELATIN ABSORBABLE 12-7 MM EX MISC
CUTANEOUS | Status: AC
  Filled 2015-02-09: qty 1

## 2015-02-09 MED ORDER — FENTANYL CITRATE (PF) 100 MCG/2ML IJ SOLN
25.0000 ug | INTRAMUSCULAR | Status: DC | PRN
  Administered 2015-02-09: 50 ug via INTRAVENOUS

## 2015-02-09 MED ORDER — PROPOFOL 10 MG/ML IV BOLUS
INTRAVENOUS | Status: DC | PRN
  Administered 2015-02-09: 100 mg via INTRAVENOUS

## 2015-02-09 MED ORDER — HYDROCODONE-ACETAMINOPHEN 5-325 MG PO TABS: 1.0000 | ORAL_TABLET | ORAL | Status: DC | PRN

## 2015-02-09 MED ORDER — SODIUM CHLORIDE 0.9 % IV SOLN: INTRAVENOUS | Status: DC

## 2015-02-09 MED ORDER — OXYCODONE HCL 5 MG/5ML PO SOLN: 5.0000 mg | Freq: Once | ORAL | Status: DC | PRN

## 2015-02-09 MED ORDER — OXYCODONE HCL 5 MG PO TABS: 5.0000 mg | ORAL_TABLET | Freq: Once | ORAL | Status: DC | PRN

## 2015-02-09 NOTE — Progress Notes (Addendum)
Received patient from [place].  Patient AOx3, VS stable, pain at 2/10, on room air at O2Sat=97% and noted dry dressings from paracentesis.  Patient oriented to room, bed controls and call light.  Gave chicken broth and water to drink.

## 2015-02-09 NOTE — Progress Notes (Signed)
6425 cc output from paracentesis

## 2015-02-09 NOTE — Anesthesia Preprocedure Evaluation (Addendum)
Anesthesia Evaluation  Patient identified by MRN, date of birth, ID band Patient awake    Reviewed: Allergy & Precautions, NPO status , Patient's Chart, lab work & pertinent test results, reviewed documented beta blocker date and time   Airway Mallampati: III  TM Distance: >3 FB Neck ROM: Full    Dental  (+) Teeth Intact, Poor Dentition   Pulmonary shortness of breath, former smoker,    breath sounds clear to auscultation       Cardiovascular hypertension, Pt. on medications and Pt. on home beta blockers  Rhythm:Regular Rate:Normal     Neuro/Psych negative neurological ROS  negative psych ROS   GI/Hepatic Neg liver ROS, GERD  Medicated,  Endo/Other  negative endocrine ROS  Renal/GU negative Renal ROS  negative genitourinary   Musculoskeletal negative musculoskeletal ROS (+)   Abdominal   Peds negative pediatric ROS (+)  Hematology negative hematology ROS (+)   Anesthesia Other Findings   Reproductive/Obstetrics negative OB ROS                            Lab Results  Component Value Date   WBC 4.5 02/04/2015   HGB 10.7* 02/04/2015   HCT 32.1* 02/04/2015   MCV 107.7* 02/04/2015   PLT 158 02/04/2015   Lab Results  Component Value Date   CREATININE 1.37* 02/04/2015   BUN 11 02/04/2015   NA 131* 02/04/2015   K 4.2 02/04/2015   CL 97* 02/04/2015   CO2 23 02/04/2015   No results found for: INR, PROTIME  EKG: sinus bradycardia.   Anesthesia Physical Anesthesia Plan  ASA: III  Anesthesia Plan: General   Post-op Pain Management:    Induction: Intravenous  Airway Management Planned: Oral ETT  Additional Equipment:   Intra-op Plan:   Post-operative Plan: Extubation in OR  Informed Consent: I have reviewed the patients History and Physical, chart, labs and discussed the procedure including the risks, benefits and alternatives for the proposed anesthesia with the patient  or authorized representative who has indicated his/her understanding and acceptance.   Dental advisory given  Plan Discussed with: CRNA  Anesthesia Plan Comments:         Anesthesia Quick Evaluation

## 2015-02-09 NOTE — Transfer of Care (Signed)
Immediate Anesthesia Transfer of Care Note  Patient: [name]  Procedure(s) Performed: Procedure(s): TIPS (N/A)  Patient Location: PACU  Anesthesia Type:General  Level of Consciousness: awake, alert  and oriented  Airway & Oxygen Therapy: Patient Spontanous Breathing and Patient connected to nasal cannula oxygen  Post-op Assessment: Report given to RN and Post -op Vital signs reviewed and stable  Post vital signs: Reviewed and stable  Last Vitals:  Filed Vitals:   02/09/15 0900  BP: 153/87  Pulse: 76  Temp: 36.7 C  Resp: 20    Complications: No apparent anesthesia complications

## 2015-02-09 NOTE — Anesthesia Procedure Notes (Signed)
Procedure Name: Intubation Date/Time: 02/09/2015 4:52 PM Performed by: Melina Copa, Brevon Dewald R Pre-anesthesia Checklist: Patient identified, Emergency Drugs available, Suction available, Patient being monitored and Timeout performed Patient Re-evaluated:Patient Re-evaluated prior to inductionOxygen Delivery Method: Circle system utilized Preoxygenation: Pre-oxygenation with 100% oxygen Intubation Type: IV induction Ventilation: Mask ventilation without difficulty Laryngoscope Size: Mac and 4 Grade View: Grade II Tube type: Oral Tube size: 8.0 mm Number of attempts: 1 Airway Equipment and Method: Stylet Placement Confirmation: ETT inserted through vocal cords under direct vision and positive ETCO2 Secured at: 22 cm Tube secured with: Tape Dental Injury: Teeth and Oropharynx as per pre-operative assessment

## 2015-02-09 NOTE — Anesthesia Postprocedure Evaluation (Signed)
  Anesthesia Post-op Note  Patient: [name]  Procedure(s) Performed: Procedure(s): TIPS (N/A)  Patient Location: PACU  Anesthesia Type:General  Level of Consciousness: awake, alert  and oriented  Airway and Oxygen Therapy: Patient Spontanous Breathing and Patient connected to nasal cannula oxygen  Post-op Pain: mild  Post-op Assessment: Post-op Vital signs reviewed, Patient's Cardiovascular Status Stable, Respiratory Function Stable, Patent Airway and Pain level controlled LLE Motor Response: Purposeful movement, Responds to commands   RLE Motor Response: Purposeful movement, Responds to commands        Post-op Vital Signs: stable  Last Vitals:  Filed Vitals:   02/09/15 2106  BP: 122/74  Pulse: 69  Temp: 36.4 C  Resp: 17    Complications: No apparent anesthesia complications

## 2015-02-09 NOTE — Progress Notes (Signed)
Portal Vein Pressure 5

## 2015-02-09 NOTE — H&P (Signed)
Chief Complaint: Patient was seen in consultation today for TIPS procedure at the request of Dr. Laurence Spates Referring Physician(s): Dr. Laurence Spates  History of Present Illness: [name] is a 69 y.o. male with hx of alcoholic cirrhosis and recurrent large volume ascites. He has undergone several large volume paracentesis and was referred to Dr. Laurence Ferrari for consideration of TIPS vs [place] shunt. He is now scheduled for TIPS procedure. Please see Dr. Katrinka Blazing consult from 10/4 for details. He is here today for procedure, no new c/o. Feels his abdomen is distended with ascites again but not to the level it was at before, though he feels he could benefit from paracentesis. No recent chills, fevers, illness. Has been NPO this am  Past Medical History  Diagnosis Date  . Hypertension     under control  . History of cancer chemotherapy   . Hx of radiation therapy 2002  . Gout     under control  . Umbilical hernia   . Fatty liver     "under control, being monitored"  . Esophageal cancer ([place]) 2002    esophagus removed  . Shortness of breath dyspnea   . GERD (gastroesophageal reflux disease)   . Alcoholic cirrhosis ([place])     Past Surgical History  Procedure Laterality Date  . Esophagus surgery  2002  . Incisional hernia repair  2004  . Tonsillectomy  as child  . Umbilical hernia repair N/A 06/14/2014    Procedure:  OPEN UMBILICAL HERNIA REPAIR;  Surgeon: Pedro Earls, MD;  Location: [facility];  Service: General;  Laterality: N/A;  . Tonsillectomy      Allergies: Amoxicillin  Medications: Prior to Admission medications   Medication Sig Start Date End Date Taking? Authorizing Provider  allopurinol (ZYLOPRIM) 300 MG tablet Take 300 mg by mouth daily.  12/20/12   Historical Provider, MD  atenolol (TENORMIN) 50 MG tablet Take 50 mg by mouth every morning.    Historical Provider, MD  ibuprofen (ADVIL,MOTRIN) 200 MG tablet Take 400 mg by mouth every 6 (six) hours  as needed for headache or moderate pain.    Historical Provider, MD  omeprazole (PRILOSEC) 20 MG capsule Take 20 mg by mouth daily.  01/18/13   Historical Provider, MD     Family History  Problem Relation Age of Onset  . Diabetes Father   . Diabetes Brother     Social History   Social History  . Marital Status: Married    Spouse Name: N/A  . Number of Children: N/A  . Years of Education: N/A   Social History Main Topics  . Smoking status: Former Smoker -- 0.50 packs/day for 40 years    Types: Cigarettes    Quit date: 04/16/2004  . Smokeless tobacco: Never Used  . Alcohol Use: 4.2 oz/week    7 Glasses of wine per week     Comment: wine daily  . Drug Use: No  . Sexual Activity: Not on file   Other Topics Concern  . Not on file   Social History Narrative     Review of Systems: A 12 point ROS discussed and pertinent positives are indicated in the HPI above.  All other systems are negative.  Review of Systems  Vital Signs: There were no vitals taken for this visit.  Physical Exam  Constitutional: He is oriented to person, place, and time. He appears well-developed and well-nourished. No distress.  HENT:  Head: Normocephalic.  Mouth/Throat: Oropharynx is clear and moist.  Neck: Normal range of motion. No JVD present. No tracheal deviation present.  Cardiovascular: Normal rate, regular rhythm, normal heart sounds and intact distal pulses.   Pulmonary/Chest: Effort normal and breath sounds normal. No respiratory distress. He has no wheezes.  Abdominal: Soft. He exhibits distension. He exhibits no mass. There is no tenderness.  Neurological: He is alert and oriented to person, place, and time.  Skin: Skin is warm and dry. No rash noted.  Psychiatric: He has a normal mood and affect. Judgment and thought content normal.    Imaging: No results found.  Labs:  CBC:  Recent Labs  06/08/14 1030 06/14/14 1045 06/14/14 1416 06/15/14 0535 02/04/15 1307  WBC 4.9  --   4.5 4.2 4.5  HGB 12.2* 12.2* 11.2* 11.2* 10.7*  HCT 36.0* 36.0* 33.4* 33.6* 32.1*  PLT 168  --  147* 136* 158    COAGS:  Recent Labs  02/09/15 0927  INR 1.11  APTT 37    BMP:  Recent Labs  06/08/14 1030 06/14/14 1045 06/14/14 1416 06/15/14 0535 02/04/15 1307 02/09/15 0927  NA 132* 133*  --  129* 131* 134*  K 6.1* 4.6  --  5.3* 4.2 4.2  CL 98  --   --  97 97* 100*  CO2 24  --   --  23 23 21*  GLUCOSE 106* 96  --  106* 119* 92  BUN 22  --   --  21 11 12   CALCIUM 9.6  --   --  8.8 8.7* 8.8*  CREATININE 1.32  --  1.27 1.20 1.37* 1.21  GFRNONAA 53*  --  56* 60* 50* 58*  GFRAA 61*  --  64* 69* 58* >60    LIVER FUNCTION TESTS:  Recent Labs  06/08/14 1030 02/04/15 1307 02/09/15 0927  BILITOT 0.9 0.7 0.5  AST 29 24 26   ALT 12 11* 8*  ALKPHOS 116 168* 136*  PROT 7.9 6.7 6.8  ALBUMIN 4.3 3.3* 3.3*    Assessment and Plan: Alcoholic cirrhosis with recurrent large volume ascites Plan to move forward with TIPS procedure today, as well as likely paracentesis. Procedure, risks, complications reviewed, including bleeding, infection, encephalopathy and liver dysfunction although he falls into the low risk category for both having had no prior history of encephalopathy and with good hepatic reserve. His MELD today is 7. He is agreeable to proceed and signed consent is obtained.   SignedAscencion Dike 02/09/2015, 11:09 AM   I spent a total of 15 minutes in face to face in clinical consultation, greater than 50% of which was counseling/coordinating care for TIPS procedure

## 2015-02-09 NOTE — Procedures (Signed)
Interventional Radiology Procedure Note  Procedure:   1.) Paracentesis removing 6.4 L ascites 2.) Creation of a middle hepatic to right portal vein TIPS.   Complications: None  Estimated Blood Loss: < 25 mL  Recommendations: - Admit for obs - Begin lactulose  -ADAT  Signed,  Criselda Peaches, MD

## 2015-02-10 ENCOUNTER — Other Ambulatory Visit (HOSPITAL_COMMUNITY): Payer: Self-pay | Admitting: Radiology

## 2015-02-10 ENCOUNTER — Other Ambulatory Visit (HOSPITAL_COMMUNITY): Payer: Self-pay | Admitting: Interventional Radiology

## 2015-02-10 ENCOUNTER — Encounter (HOSPITAL_COMMUNITY): Payer: Self-pay | Admitting: Interventional Radiology

## 2015-02-10 ENCOUNTER — Other Ambulatory Visit: Payer: Self-pay | Admitting: *Deleted

## 2015-02-10 DIAGNOSIS — Z95828 Presence of other vascular implants and grafts: Secondary | ICD-10-CM

## 2015-02-10 DIAGNOSIS — R188 Other ascites: Secondary | ICD-10-CM

## 2015-02-10 DIAGNOSIS — K7031 Alcoholic cirrhosis of liver with ascites: Secondary | ICD-10-CM | POA: Diagnosis not present

## 2015-02-10 LAB — PROTIME-INR
INR: 1.22 (ref 0.00–1.49)
Prothrombin Time: 15.6 seconds — ABNORMAL HIGH (ref 11.6–15.2)

## 2015-02-10 LAB — COMPREHENSIVE METABOLIC PANEL
ALT: 11 U/L — ABNORMAL LOW (ref 17–63)
AST: 32 U/L (ref 15–41)
Albumin: 2.7 g/dL — ABNORMAL LOW (ref 3.5–5.0)
Alkaline Phosphatase: 128 U/L — ABNORMAL HIGH (ref 38–126)
Anion gap: 7 (ref 5–15)
BILIRUBIN TOTAL: 0.9 mg/dL (ref 0.3–1.2)
BUN: 12 mg/dL (ref 6–20)
CHLORIDE: 100 mmol/L — AB (ref 101–111)
CO2: 26 mmol/L (ref 22–32)
Calcium: 8.2 mg/dL — ABNORMAL LOW (ref 8.9–10.3)
Creatinine, Ser: 1.25 mg/dL — ABNORMAL HIGH (ref 0.61–1.24)
GFR, EST NON AFRICAN AMERICAN: 56 mL/min — AB (ref >60)
Glucose, Bld: 101 mg/dL — ABNORMAL HIGH (ref 65–99)
POTASSIUM: 4.3 mmol/L (ref 3.5–5.1)
Sodium: 133 mmol/L — ABNORMAL LOW (ref 135–145)
TOTAL PROTEIN: 5.7 g/dL — AB (ref 6.5–8.1)

## 2015-02-10 LAB — CBC
HEMATOCRIT: 29.3 % — AB (ref 39.0–52.0)
Hemoglobin: 9.7 g/dL — ABNORMAL LOW (ref 13.0–17.0)
MCH: 35.8 pg — ABNORMAL HIGH (ref 26.0–34.0)
MCHC: 33.1 g/dL (ref 30.0–36.0)
MCV: 108.1 fL — AB (ref 78.0–100.0)
PLATELETS: 131 10*3/uL — AB (ref 150–400)
RBC: 2.71 MIL/uL — AB (ref 4.22–5.81)
RDW: 14.5 % (ref 11.5–15.5)
WBC: 5.1 10*3/uL (ref 4.0–10.5)

## 2015-02-10 MED ORDER — ONDANSETRON HCL 4 MG/2ML IJ SOLN: 4.0000 mg | Freq: Four times a day (QID) | INTRAMUSCULAR | Status: DC | PRN

## 2015-02-10 MED ORDER — LACTULOSE 10 GM/15ML PO SOLN
20.0000 g | Freq: Three times a day (TID) | ORAL | Status: DC
  Administered 2015-02-10: 20 g via ORAL
  Filled 2015-02-10: qty 30

## 2015-02-10 MED ORDER — MENTHOL 3 MG MT LOZG
1.0000 | LOZENGE | OROMUCOSAL | Status: DC | PRN
  Administered 2015-02-10: 3 mg via ORAL
  Filled 2015-02-10: qty 9

## 2015-02-10 MED ORDER — MENTHOL 3 MG MT LOZG: 1.0000 | LOZENGE | OROMUCOSAL | Status: DC | PRN

## 2015-02-10 MED ORDER — HYDROMORPHONE HCL 1 MG/ML IJ SOLN: 1.0000 mg | INTRAMUSCULAR | Status: DC | PRN

## 2015-02-10 NOTE — Progress Notes (Signed)
[facility] discharge instructions were reviewed with patient. Patient stated that he did not have any questions. Volunteer assisted patient to his transportation.

## 2015-02-10 NOTE — Discharge Summary (Signed)
Patient ID: [name] MRN: [id number] DOB/AGE: [birth year] 69 y.o.  Admit date: 02/09/2015 Discharge date: 02/10/2015  Admission Diagnoses: ETOH Cirrhosis  Discharge Diagnoses:  Active Problems:   S/P TIPS (transjugular intrahepatic portosystemic shunt)   Discharged Condition: stable  Hospital Course: ETOH Cirrhosis; recurrent ascites--refractory Procedure: 02/09/2015 in IR with Dr Laurence Ferrari 1.) Paracentesis removing 6.4 L ascites 2.) Creation of a middle hepatic to right portal vein TIPS.  Pt has done well overnight; denies pain Urinating well; passing gas Slept well; eating well Ambulating No confusion today I have seen and examined pt Discussed with Dr Laurence Ferrari Plan now for dc to home  Consults: None  Significant Diagnostic Studies: Transjugular Intrahepatic Portal system shunt placement  Successful creation of a middle hepatic to right portal venous TIPS. Pre TIPS portal venous pressure of 17 mm Hg corrected to 6 mm of mercury after TIPS creation.  Treatments: US guided paracentesis  Large volume paracentesis with removal of 6.4 L amber colored Ascites  Discharge Exam: Blood pressure 94/52, pulse 71, temperature 98.6 F (37 C), temperature source Oral, resp. rate 16, height 6\' 2"  (1.88 m), weight 221 lb 9 oz (100.5 kg), SpO2 97 %.  PE:  Appropriate Pleasant A/O Neck: Rt IJ site clean and dry; no hematoma; NT Heart: RRR Lungs: CTA Abd: minimally distended NT; no masses; sites of para and TIPS NT no bleeding; no hematoma FROM  Results for orders placed or performed during the hospital encounter of 02/09/15  Comprehensive metabolic panel  Result Value Ref Range   Sodium 134 (L) 135 - 145 mmol/L   Potassium 4.2 3.5 - 5.1 mmol/L   Chloride 100 (L) 101 - 111 mmol/L   CO2 21 (L) 22 - 32 mmol/L   Glucose, Bld 92 65 - 99 mg/dL   BUN 12 6 - 20 mg/dL   Creatinine, Ser 1.21 0.61 - 1.24 mg/dL   Calcium 8.8 (L) 8.9 - 10.3 mg/dL   Total Protein 6.8  6.5 - 8.1 g/dL   Albumin 3.3 (L) 3.5 - 5.0 g/dL   AST 26 15 - 41 U/L   ALT 8 (L) 17 - 63 U/L   Alkaline Phosphatase 136 (H) 38 - 126 U/L   Total Bilirubin 0.5 0.3 - 1.2 mg/dL   GFR calc non Af Amer 58 (L) >60 mL/min   GFR calc Af Amer >60 >60 mL/min   Anion gap 13 5 - 15  APTT  Result Value Ref Range   aPTT 37 24 - 37 seconds  Protime-INR  Result Value Ref Range   Prothrombin Time 14.5 11.6 - 15.2 seconds   INR 1.11 0.00 - 1.49  Comprehensive metabolic panel  Result Value Ref Range   Sodium 133 (L) 135 - 145 mmol/L   Potassium 4.3 3.5 - 5.1 mmol/L   Chloride 100 (L) 101 - 111 mmol/L   CO2 26 22 - 32 mmol/L   Glucose, Bld 101 (H) 65 - 99 mg/dL   BUN 12 6 - 20 mg/dL   Creatinine, Ser 1.25 (H) 0.61 - 1.24 mg/dL   Calcium 8.2 (L) 8.9 - 10.3 mg/dL   Total Protein 5.7 (L) 6.5 - 8.1 g/dL   Albumin 2.7 (L) 3.5 - 5.0 g/dL   AST 32 15 - 41 U/L   ALT 11 (L) 17 - 63 U/L   Alkaline Phosphatase 128 (H) 38 - 126 U/L   Total Bilirubin 0.9 0.3 - 1.2 mg/dL   GFR calc non Af Amer 56 (L) >60 mL/min  GFR calc Af Amer >60 >60 mL/min   Anion gap 7 5 - 15  CBC  Result Value Ref Range   WBC 5.1 4.0 - 10.5 K/uL   RBC 2.71 (L) 4.22 - 5.81 MIL/uL   Hemoglobin 9.7 (L) 13.0 - 17.0 g/dL   HCT 29.3 (L) 39.0 - 52.0 %   MCV 108.1 (H) 78.0 - 100.0 fL   MCH 35.8 (H) 26.0 - 34.0 pg   MCHC 33.1 30.0 - 36.0 g/dL   RDW 14.5 11.5 - 15.5 %   Platelets 131 (L) 150 - 400 K/uL  Protime-INR  Result Value Ref Range   Prothrombin Time 15.6 (H) 11.6 - 15.2 seconds   INR 1.22 0.00 - 1.49    Disposition: post TIPs procedure in IR with Dr Laurence Ferrari 10/26 Pt has done well overnight Plan for dc now Dr Laurence Ferrari has seen labs and I have reported to him pts status. No confusion today Resume all home meds Add: Lactulose 20 gr 3 x /day Until has 2 soft BMs daily Rx given to pt To follow up with Dr Laurence Ferrari in 6 weeks for [place] TIPs and labs Pt has good understanding of dc instructions  Discharge Instructions     Call MD for:  difficulty breathing, headache or visual disturbances    Complete by:  As directed      Call MD for:  extreme fatigue    Complete by:  As directed      Call MD for:  hives    Complete by:  As directed      Call MD for:  persistant dizziness or light-headedness    Complete by:  As directed      Call MD for:  persistant nausea and vomiting    Complete by:  As directed      Call MD for:  redness, tenderness, or signs of infection (pain, swelling, redness, odor or green/yellow discharge around incision site)    Complete by:  As directed      Call MD for:  severe uncontrolled pain    Complete by:  As directed      Call MD for:  temperature >100.4    Complete by:  As directed      Diet - low sodium heart healthy    Complete by:  As directed      Discharge instructions    Complete by:  As directed   Resume all home meds; will see Dr Laurence Ferrari in 6 week follow up at [facility]; call [phone] for questions or concerns; clinic will call pt with appt time and date     Discharge wound care:    Complete by:  As directed   May shower today; may remove bandages today; keep clean bandaid on all sites daily x 1 week     Driving Restrictions    Complete by:  As directed   No driving x 4 days     Increase activity slowly    Complete by:  As directed      Lifting restrictions    Complete by:  As directed   No lifting over 10 lbs x 4 days            Medication List    TAKE these medications        allopurinol 300 MG tablet  Commonly known as:  ZYLOPRIM  Take 300 mg by mouth daily.     atenolol 50 MG tablet  Commonly known as:  TENORMIN  Take 50 mg by mouth every morning.     ibuprofen 200 MG tablet  Commonly known as:  ADVIL,MOTRIN  Take 400 mg by mouth every 6 (six) hours as needed for headache or moderate pain.     omeprazole 20 MG capsule  Commonly known as:  PRILOSEC  Take 20 mg by mouth daily.           Follow-up Information    Follow up with  [facility], [place], MD In 6 weeks.   Specialties:  Interventional Radiology, Radiology   Why:  clinic will call pt with time and date of follow up   Contact information:   [address] [place] [address] [phone]        Signed: Monia Sabal A 02/10/2015, 10:51 AM   I have spent Greater Than 30 Minutes discharging [place].

## 2015-03-23 LAB — CBC
HCT: 34.5 % — ABNORMAL LOW (ref 39.0–52.0)
HEMOGLOBIN: 11.7 g/dL — AB (ref 13.0–17.0)
MCH: 34.8 pg — AB (ref 26.0–34.0)
MCHC: 33.9 g/dL (ref 30.0–36.0)
MCV: 102.7 fL — AB (ref 78.0–100.0)
MPV: 8.7 fL (ref 8.6–12.4)
PLATELETS: 163 10*3/uL (ref 150–400)
RBC: 3.36 MIL/uL — AB (ref 4.22–5.81)
RDW: 14.1 % (ref 11.5–15.5)
WBC: 4 10*3/uL (ref 4.0–10.5)

## 2015-03-24 LAB — COMPLETE METABOLIC PANEL WITH GFR
ALT: 9 U/L (ref 9–46)
AST: 26 U/L (ref 10–35)
Albumin: 3.3 g/dL — ABNORMAL LOW (ref 3.6–5.1)
Alkaline Phosphatase: 148 U/L — ABNORMAL HIGH (ref 40–115)
BILIRUBIN TOTAL: 1.1 mg/dL (ref 0.2–1.2)
BUN: 16 mg/dL (ref 7–25)
CHLORIDE: 99 mmol/L (ref 98–110)
CO2: 25 mmol/L (ref 20–31)
Calcium: 9 mg/dL (ref 8.6–10.3)
Creat: 1.24 mg/dL — ABNORMAL HIGH (ref 0.70–1.18)
GFR, EST AFRICAN AMERICAN: 67 mL/min (ref >=60)
GFR, EST NON AFRICAN AMERICAN: 58 mL/min — AB (ref >=60)
GLUCOSE: 90 mg/dL (ref 65–99)
Potassium: 4.7 mmol/L (ref 3.5–5.3)
SODIUM: 136 mmol/L (ref 135–146)
TOTAL PROTEIN: 6.7 g/dL (ref 6.1–8.1)

## 2015-03-24 LAB — PROTIME-INR
INR: 1.14 (ref <1.50)
Prothrombin Time: 14.7 seconds (ref 11.6–15.2)

## 2015-03-29 ENCOUNTER — Ambulatory Visit
Admission: RE | Admit: 2015-03-29 | Discharge: 2015-03-29 | Disposition: A | Discharge status: Home / Self Care | Payer: Medicare Other | Source: Ambulatory Visit | Attending: Radiology | Admitting: Radiology

## 2015-03-29 ENCOUNTER — Ambulatory Visit
Admission: RE | Admit: 2015-03-29 | Discharge: 2015-03-29 | Disposition: A | Discharge status: Home / Self Care | Payer: Medicare Other | Source: Ambulatory Visit | Attending: Interventional Radiology | Admitting: Interventional Radiology

## 2015-03-29 DIAGNOSIS — R188 Other ascites: Secondary | ICD-10-CM

## 2015-03-29 DIAGNOSIS — Z95828 Presence of other vascular implants and grafts: Secondary | ICD-10-CM

## 2015-03-29 NOTE — Progress Notes (Signed)
Subjective: [name] is here today for follow up evaluation. He underwent TIPS procedure on 02/09/15 for refractory ascites. He's done very well. Has noticed about a 30 lb weight loss and has now leveled off. He's had no change in mental status and denies abdominal distention. He feels much better overall and is pleased with the procedure. He is no longer taking the lactulose and his BMs are regular.  Allergies: Amoxicillin  Medications: Prior to Admission medications   Medication Sig Start Date End Date Taking? Authorizing Provider  allopurinol (ZYLOPRIM) 300 MG tablet Take 300 mg by mouth daily.  12/20/12  Yes Historical Provider, MD  atenolol (TENORMIN) 50 MG tablet Take 50 mg by mouth every morning.   Yes Historical Provider, MD  ibuprofen (ADVIL,MOTRIN) 200 MG tablet Take 400 mg by mouth every 6 (six) hours as needed for headache or moderate pain.   Yes Historical Provider, MD  omeprazole (PRILOSEC) 20 MG capsule Take 20 mg by mouth daily.  01/18/13  Yes Historical Provider, MD     Vital Signs: BP 147/69 mmHg  Pulse 70  Temp(Src) 98.3 F (36.8 C) (Oral)  Ht 6\' 2"  (1.88 m)  Wt 211 lb (95.709 kg)  BMI 27.08 kg/m2  SpO2 98%  Physical Exam  Constitutional: He is oriented to person, place, and time. He appears well-developed and well-nourished. No distress.  Cardiovascular: Normal rate, regular rhythm and normal heart sounds.   Pulmonary/Chest: Effort normal and breath sounds normal. No respiratory distress.  Abdominal: Soft. He exhibits no distension and no mass. There is no tenderness.  Neurological: He is alert and oriented to person, place, and time.  Skin: Skin is warm. No rash noted.  Psychiatric: He has a normal mood and affect. Judgment normal.    Imaging: No results found.  Labs:  CBC:  Recent Labs  06/15/14 0535 02/04/15 1307 02/10/15 0805 03/23/15 1133  WBC 4.2 4.5 5.1 4.0  HGB 11.2* 10.7* 9.7* 11.7*  HCT 33.6* 32.1* 29.3* 34.5*  PLT 136* 158  131* 163    COAGS:  Recent Labs  02/09/15 0927 02/10/15 0805 03/23/15 1133  INR 1.11 1.22 1.14  APTT 37  --   --     BMP:  Recent Labs  02/04/15 1307 02/09/15 0927 02/10/15 0805 03/23/15 1133  NA 131* 134* 133* 136  K 4.2 4.2 4.3 4.7  CL 97* 100* 100* 99  CO2 23 21* 26 25  GLUCOSE 119* 92 101* 90  BUN 11 12 12 16   CALCIUM 8.7* 8.8* 8.2* 9.0  CREATININE 1.37* 1.21 1.25* 1.24*  GFRNONAA 50* 58* 56* 58*  GFRAA 58* >60 >60 67    LIVER FUNCTION TESTS:  Recent Labs  02/04/15 1307 02/09/15 0927 02/10/15 0805 03/23/15 1133  BILITOT 0.7 0.5 0.9 1.1  AST 24 26 32 26  ALT 11* 8* 11* 9  ALKPHOS 168* 136* 128* 148*  PROT 6.7 6.8 5.7* 6.7  ALBUMIN 3.3* 3.3* 2.7* 3.3*    Assessment and Plan: Alcoholic cirrhosis with refractory ascites S/p elective TIPS on 10/26 Had labs and [place] prior to visit. LFT stable, CR stable at 1.2. US shows widely patent TIPS, no ascites He is to see Dr. Oletta Lamas next week. Recommend 6 month repeat TIPS [place].  SignedAscencion Dike 03/29/2015, 10:31 AM   I spent a total of 15 Minutes at the the patient's bedside AND on the patient's hospital floor or unit, greater than 50% of which was counseling/coordinating care for post TIPS evaluation

## 2015-06-24 ENCOUNTER — Other Ambulatory Visit (HOSPITAL_COMMUNITY): Payer: Self-pay | Admitting: Interventional Radiology

## 2015-06-24 ENCOUNTER — Other Ambulatory Visit: Payer: Self-pay | Admitting: *Deleted

## 2015-06-24 DIAGNOSIS — Z95828 Presence of other vascular implants and grafts: Secondary | ICD-10-CM

## 2015-06-24 DIAGNOSIS — R188 Other ascites: Secondary | ICD-10-CM

## 2015-06-30 ENCOUNTER — Other Ambulatory Visit: Payer: Medicare Other

## 2015-06-30 LAB — CBC
HCT: 32.6 % — ABNORMAL LOW (ref 39.0–52.0)
Hemoglobin: 11.6 g/dL — ABNORMAL LOW (ref 13.0–17.0)
MCH: 34.3 pg — ABNORMAL HIGH (ref 26.0–34.0)
MCHC: 35.6 g/dL (ref 30.0–36.0)
MCV: 96.4 fL (ref 78.0–100.0)
MPV: 9.3 fL (ref 8.6–12.4)
PLATELETS: 116 10*3/uL — AB (ref 150–400)
RBC: 3.38 MIL/uL — ABNORMAL LOW (ref 4.22–5.81)
RDW: 14.5 % (ref 11.5–15.5)
WBC: 6.9 10*3/uL (ref 4.0–10.5)

## 2015-06-30 LAB — AMMONIA: Ammonia: 138 umol/L — ABNORMAL HIGH (ref 16–53)

## 2015-06-30 LAB — PROTIME-INR
INR: 1.13 (ref <1.50)
PROTHROMBIN TIME: 14.6 s (ref 11.6–15.2)

## 2015-07-01 LAB — COMPLETE METABOLIC PANEL WITH GFR
ALBUMIN: 3.9 g/dL (ref 3.6–5.1)
ALK PHOS: 136 U/L — AB (ref 40–115)
ALT: 66 U/L — AB (ref 9–46)
AST: 369 U/L — AB (ref 10–35)
BUN: 20 mg/dL (ref 7–25)
CALCIUM: 9 mg/dL (ref 8.6–10.3)
CO2: 26 mmol/L (ref 20–31)
CREATININE: 1.77 mg/dL — AB (ref 0.70–1.18)
Chloride: 88 mmol/L — ABNORMAL LOW (ref 98–110)
GFR, Est African American: 43 mL/min — ABNORMAL LOW (ref >=60)
GFR, Est Non African American: 38 mL/min — ABNORMAL LOW (ref >=60)
Glucose, Bld: 95 mg/dL (ref 65–99)
Potassium: 4.3 mmol/L (ref 3.5–5.3)
Sodium: 127 mmol/L — ABNORMAL LOW (ref 135–146)
Total Bilirubin: 1.3 mg/dL — ABNORMAL HIGH (ref 0.2–1.2)
Total Protein: 6.8 g/dL (ref 6.1–8.1)

## 2015-07-04 ENCOUNTER — Observation Stay (HOSPITAL_COMMUNITY): Payer: Medicare HMO

## 2015-07-04 ENCOUNTER — Inpatient Hospital Stay (HOSPITAL_COMMUNITY)
Admission: EM | Admit: 2015-07-04 | Discharge: 2015-07-22 | DRG: 432 | Disposition: A | Discharge status: Skilled Nursing Facility | Payer: Medicare HMO | Attending: Family Medicine | Admitting: Family Medicine

## 2015-07-04 ENCOUNTER — Encounter (HOSPITAL_COMMUNITY): Payer: Self-pay | Admitting: Emergency Medicine

## 2015-07-04 ENCOUNTER — Emergency Department (HOSPITAL_COMMUNITY): Payer: Medicare HMO

## 2015-07-04 DIAGNOSIS — M109 Gout, unspecified: Secondary | ICD-10-CM | POA: Diagnosis present

## 2015-07-04 DIAGNOSIS — Z79899 Other long term (current) drug therapy: Secondary | ICD-10-CM

## 2015-07-04 DIAGNOSIS — N189 Chronic kidney disease, unspecified: Secondary | ICD-10-CM | POA: Diagnosis present

## 2015-07-04 DIAGNOSIS — E43 Unspecified severe protein-calorie malnutrition: Secondary | ICD-10-CM | POA: Diagnosis present

## 2015-07-04 DIAGNOSIS — H9190 Unspecified hearing loss, unspecified ear: Secondary | ICD-10-CM | POA: Diagnosis present

## 2015-07-04 DIAGNOSIS — J9691 Respiratory failure, unspecified with hypoxia: Secondary | ICD-10-CM | POA: Diagnosis present

## 2015-07-04 DIAGNOSIS — E876 Hypokalemia: Secondary | ICD-10-CM | POA: Diagnosis not present

## 2015-07-04 DIAGNOSIS — F10939 Alcohol use, unspecified with withdrawal, unspecified: Secondary | ICD-10-CM | POA: Diagnosis present

## 2015-07-04 DIAGNOSIS — K7460 Unspecified cirrhosis of liver: Secondary | ICD-10-CM

## 2015-07-04 DIAGNOSIS — R4182 Altered mental status, unspecified: Secondary | ICD-10-CM | POA: Diagnosis not present

## 2015-07-04 DIAGNOSIS — R140 Abdominal distension (gaseous): Secondary | ICD-10-CM | POA: Diagnosis not present

## 2015-07-04 DIAGNOSIS — K7290 Hepatic failure, unspecified without coma: Secondary | ICD-10-CM | POA: Diagnosis present

## 2015-07-04 DIAGNOSIS — I509 Heart failure, unspecified: Secondary | ICD-10-CM

## 2015-07-04 DIAGNOSIS — N179 Acute kidney failure, unspecified: Secondary | ICD-10-CM | POA: Diagnosis present

## 2015-07-04 DIAGNOSIS — J189 Pneumonia, unspecified organism: Secondary | ICD-10-CM

## 2015-07-04 DIAGNOSIS — D6959 Other secondary thrombocytopenia: Secondary | ICD-10-CM | POA: Diagnosis present

## 2015-07-04 DIAGNOSIS — R402242 Coma scale, best verbal response, confused conversation, at arrival to emergency department: Secondary | ICD-10-CM | POA: Diagnosis present

## 2015-07-04 DIAGNOSIS — Z9049 Acquired absence of other specified parts of digestive tract: Secondary | ICD-10-CM

## 2015-07-04 DIAGNOSIS — R402362 Coma scale, best motor response, obeys commands, at arrival to emergency department: Secondary | ICD-10-CM | POA: Diagnosis present

## 2015-07-04 DIAGNOSIS — R471 Dysarthria and anarthria: Secondary | ICD-10-CM | POA: Diagnosis not present

## 2015-07-04 DIAGNOSIS — G9340 Encephalopathy, unspecified: Secondary | ICD-10-CM

## 2015-07-04 DIAGNOSIS — F1010 Alcohol abuse, uncomplicated: Secondary | ICD-10-CM | POA: Diagnosis present

## 2015-07-04 DIAGNOSIS — J9690 Respiratory failure, unspecified, unspecified whether with hypoxia or hypercapnia: Secondary | ICD-10-CM

## 2015-07-04 DIAGNOSIS — R001 Bradycardia, unspecified: Secondary | ICD-10-CM | POA: Diagnosis not present

## 2015-07-04 DIAGNOSIS — E872 Acidosis: Secondary | ICD-10-CM | POA: Diagnosis not present

## 2015-07-04 DIAGNOSIS — Z66 Do not resuscitate: Secondary | ICD-10-CM | POA: Diagnosis not present

## 2015-07-04 DIAGNOSIS — J9601 Acute respiratory failure with hypoxia: Secondary | ICD-10-CM | POA: Diagnosis not present

## 2015-07-04 DIAGNOSIS — Z923 Personal history of irradiation: Secondary | ICD-10-CM

## 2015-07-04 DIAGNOSIS — R410 Disorientation, unspecified: Secondary | ICD-10-CM

## 2015-07-04 DIAGNOSIS — Z8501 Personal history of malignant neoplasm of esophagus: Secondary | ICD-10-CM

## 2015-07-04 DIAGNOSIS — E162 Hypoglycemia, unspecified: Secondary | ICD-10-CM | POA: Diagnosis not present

## 2015-07-04 DIAGNOSIS — R339 Retention of urine, unspecified: Secondary | ICD-10-CM | POA: Diagnosis not present

## 2015-07-04 DIAGNOSIS — J9600 Acute respiratory failure, unspecified whether with hypoxia or hypercapnia: Secondary | ICD-10-CM

## 2015-07-04 DIAGNOSIS — N485 Ulcer of penis: Secondary | ICD-10-CM

## 2015-07-04 DIAGNOSIS — K7682 Hepatic encephalopathy: Secondary | ICD-10-CM | POA: Diagnosis present

## 2015-07-04 DIAGNOSIS — F10239 Alcohol dependence with withdrawal, unspecified: Secondary | ICD-10-CM | POA: Diagnosis present

## 2015-07-04 DIAGNOSIS — K7030 Alcoholic cirrhosis of liver without ascites: Secondary | ICD-10-CM

## 2015-07-04 DIAGNOSIS — J9602 Acute respiratory failure with hypercapnia: Secondary | ICD-10-CM | POA: Diagnosis not present

## 2015-07-04 DIAGNOSIS — Z9889 Other specified postprocedural states: Secondary | ICD-10-CM

## 2015-07-04 DIAGNOSIS — Z9221 Personal history of antineoplastic chemotherapy: Secondary | ICD-10-CM

## 2015-07-04 DIAGNOSIS — E871 Hypo-osmolality and hyponatremia: Secondary | ICD-10-CM | POA: Diagnosis present

## 2015-07-04 DIAGNOSIS — Z23 Encounter for immunization: Secondary | ICD-10-CM

## 2015-07-04 DIAGNOSIS — Z9289 Personal history of other medical treatment: Secondary | ICD-10-CM

## 2015-07-04 DIAGNOSIS — R6521 Severe sepsis with septic shock: Secondary | ICD-10-CM | POA: Diagnosis not present

## 2015-07-04 DIAGNOSIS — R0603 Acute respiratory distress: Secondary | ICD-10-CM

## 2015-07-04 DIAGNOSIS — A419 Sepsis, unspecified organism: Secondary | ICD-10-CM | POA: Diagnosis not present

## 2015-07-04 DIAGNOSIS — K7040 Alcoholic hepatic failure without coma: Principal | ICD-10-CM | POA: Diagnosis present

## 2015-07-04 DIAGNOSIS — Z01818 Encounter for other preprocedural examination: Secondary | ICD-10-CM

## 2015-07-04 DIAGNOSIS — R402142 Coma scale, eyes open, spontaneous, at arrival to emergency department: Secondary | ICD-10-CM | POA: Diagnosis present

## 2015-07-04 DIAGNOSIS — G4733 Obstructive sleep apnea (adult) (pediatric): Secondary | ICD-10-CM | POA: Diagnosis present

## 2015-07-04 DIAGNOSIS — J690 Pneumonitis due to inhalation of food and vomit: Secondary | ICD-10-CM | POA: Diagnosis not present

## 2015-07-04 DIAGNOSIS — D638 Anemia in other chronic diseases classified elsewhere: Secondary | ICD-10-CM | POA: Diagnosis present

## 2015-07-04 DIAGNOSIS — T68XXXA Hypothermia, initial encounter: Secondary | ICD-10-CM

## 2015-07-04 DIAGNOSIS — J9692 Respiratory failure, unspecified with hypercapnia: Secondary | ICD-10-CM

## 2015-07-04 DIAGNOSIS — R0902 Hypoxemia: Secondary | ICD-10-CM

## 2015-07-04 DIAGNOSIS — K5900 Constipation, unspecified: Secondary | ICD-10-CM | POA: Diagnosis present

## 2015-07-04 DIAGNOSIS — I129 Hypertensive chronic kidney disease with stage 1 through stage 4 chronic kidney disease, or unspecified chronic kidney disease: Secondary | ICD-10-CM | POA: Diagnosis present

## 2015-07-04 DIAGNOSIS — Z6827 Body mass index (BMI) 27.0-27.9, adult: Secondary | ICD-10-CM

## 2015-07-04 DIAGNOSIS — K219 Gastro-esophageal reflux disease without esophagitis: Secondary | ICD-10-CM | POA: Diagnosis present

## 2015-07-04 DIAGNOSIS — Z87891 Personal history of nicotine dependence: Secondary | ICD-10-CM

## 2015-07-04 DIAGNOSIS — R1310 Dysphagia, unspecified: Secondary | ICD-10-CM | POA: Diagnosis not present

## 2015-07-04 LAB — COMPREHENSIVE METABOLIC PANEL
ALT: 66 U/L — AB (ref 17–63)
AST: 256 U/L — AB (ref 15–41)
Albumin: 3.7 g/dL (ref 3.5–5.0)
Alkaline Phosphatase: 121 U/L (ref 38–126)
Anion gap: 12 (ref 5–15)
BILIRUBIN TOTAL: 1.5 mg/dL — AB (ref 0.3–1.2)
BUN: 19 mg/dL (ref 6–20)
CHLORIDE: 93 mmol/L — AB (ref 101–111)
CO2: 26 mmol/L (ref 22–32)
CREATININE: 1.93 mg/dL — AB (ref 0.61–1.24)
Calcium: 9.6 mg/dL (ref 8.9–10.3)
GFR calc Af Amer: 38 mL/min — ABNORMAL LOW (ref >60)
GFR, EST NON AFRICAN AMERICAN: 33 mL/min — AB (ref >60)
Glucose, Bld: 93 mg/dL (ref 65–99)
Potassium: 3.8 mmol/L (ref 3.5–5.1)
Sodium: 131 mmol/L — ABNORMAL LOW (ref 135–145)
Total Protein: 6.8 g/dL (ref 6.5–8.1)

## 2015-07-04 LAB — URINALYSIS, ROUTINE W REFLEX MICROSCOPIC
Bilirubin Urine: NEGATIVE
GLUCOSE, UA: NEGATIVE mg/dL
Ketones, ur: 15 mg/dL — AB
LEUKOCYTES UA: NEGATIVE
Nitrite: NEGATIVE
PH: 6.5 (ref 5.0–8.0)
Protein, ur: NEGATIVE mg/dL
SPECIFIC GRAVITY, URINE: 1.019 (ref 1.005–1.030)

## 2015-07-04 LAB — URINE MICROSCOPIC-ADD ON

## 2015-07-04 LAB — I-STAT VENOUS BLOOD GAS, ED
Acid-Base Excess: 2 mmol/L (ref 0.0–2.0)
BICARBONATE: 26.8 meq/L — AB (ref 20.0–24.0)
O2 Saturation: 57 %
PCO2 VEN: 42.1 mmHg — AB (ref 45.0–50.0)
PO2 VEN: 29 mmHg — AB (ref 31.0–45.0)
TCO2: 28 mmol/L (ref 0–100)
pH, Ven: 7.411 — ABNORMAL HIGH (ref 7.250–7.300)

## 2015-07-04 LAB — CBC
HEMATOCRIT: 32.3 % — AB (ref 39.0–52.0)
HEMOGLOBIN: 11.4 g/dL — AB (ref 13.0–17.0)
MCH: 33.9 pg (ref 26.0–34.0)
MCHC: 35.3 g/dL (ref 30.0–36.0)
MCV: 96.1 fL (ref 78.0–100.0)
Platelets: 112 10*3/uL — ABNORMAL LOW (ref 150–400)
RBC: 3.36 MIL/uL — ABNORMAL LOW (ref 4.22–5.81)
RDW: 14.2 % (ref 11.5–15.5)
WBC: 4.9 10*3/uL (ref 4.0–10.5)

## 2015-07-04 LAB — ETHANOL: Alcohol, Ethyl (B): <5 mg/dL (ref <5)

## 2015-07-04 LAB — AMMONIA: Ammonia: 96 umol/L — ABNORMAL HIGH (ref 9–35)

## 2015-07-04 LAB — CBG MONITORING, ED: Glucose-Capillary: 86 mg/dL (ref 65–99)

## 2015-07-04 MED ORDER — THIAMINE HCL 100 MG/ML IJ SOLN
100.0000 mg | Freq: Every day | INTRAMUSCULAR | Status: DC
  Administered 2015-07-04 – 2015-07-11 (×6): 100 mg via INTRAVENOUS
  Filled 2015-07-04 (×7): qty 2

## 2015-07-04 MED ORDER — HEPARIN SODIUM (PORCINE) 5000 UNIT/ML IJ SOLN
5000.0000 [IU] | Freq: Three times a day (TID) | INTRAMUSCULAR | Status: DC
  Administered 2015-07-04 – 2015-07-05 (×3): 5000 [IU] via SUBCUTANEOUS
  Filled 2015-07-04 (×3): qty 1

## 2015-07-04 MED ORDER — LORAZEPAM 1 MG PO TABS: 1.0000 mg | ORAL_TABLET | Freq: Four times a day (QID) | ORAL | Status: DC | PRN

## 2015-07-04 MED ORDER — ALLOPURINOL 300 MG PO TABS
300.0000 mg | ORAL_TABLET | Freq: Every day | ORAL | Status: DC
  Administered 2015-07-05 – 2015-07-22 (×16): 300 mg via ORAL
  Filled 2015-07-04 (×17): qty 1

## 2015-07-04 MED ORDER — LORAZEPAM 2 MG/ML IJ SOLN
1.0000 mg | Freq: Four times a day (QID) | INTRAMUSCULAR | Status: DC | PRN
  Administered 2015-07-05: 1 mg via INTRAVENOUS
  Filled 2015-07-04: qty 1

## 2015-07-04 MED ORDER — ALLOPURINOL 300 MG PO TABS: 300.0000 mg | ORAL_TABLET | Freq: Every day | ORAL | Status: DC

## 2015-07-04 MED ORDER — IBUPROFEN 200 MG PO TABS: 400.0000 mg | ORAL_TABLET | Freq: Four times a day (QID) | ORAL | Status: DC | PRN

## 2015-07-04 MED ORDER — LACTULOSE 10 GM/15ML PO SOLN
30.0000 g | Freq: Once | ORAL | Status: AC
  Administered 2015-07-04: 30 g via ORAL
  Filled 2015-07-04: qty 45

## 2015-07-04 MED ORDER — PANTOPRAZOLE SODIUM 40 MG PO TBEC
40.0000 mg | DELAYED_RELEASE_TABLET | Freq: Every day | ORAL | Status: DC
  Administered 2015-07-05: 40 mg via ORAL
  Filled 2015-07-04: qty 1

## 2015-07-04 MED ORDER — VITAMIN B-1 100 MG PO TABS
100.0000 mg | ORAL_TABLET | Freq: Every day | ORAL | Status: DC
  Administered 2015-07-05 – 2015-07-22 (×13): 100 mg via ORAL
  Filled 2015-07-04 (×15): qty 1

## 2015-07-04 MED ORDER — FOLIC ACID 1 MG PO TABS
1.0000 mg | ORAL_TABLET | Freq: Every day | ORAL | Status: DC
  Administered 2015-07-05: 1 mg via ORAL
  Filled 2015-07-04: qty 1

## 2015-07-04 MED ORDER — PANTOPRAZOLE SODIUM 40 MG PO TBEC: 40.0000 mg | DELAYED_RELEASE_TABLET | Freq: Every day | ORAL | Status: DC

## 2015-07-04 MED ORDER — SODIUM CHLORIDE 0.9 % IV SOLN
INTRAVENOUS | Status: DC
  Administered 2015-07-04 – 2015-07-05 (×3): via INTRAVENOUS
  Administered 2015-07-06: 125 mL/h via INTRAVENOUS
  Administered 2015-07-07: 21:00:00 via INTRAVENOUS
  Administered 2015-07-08: 1000 mL via INTRAVENOUS
  Administered 2015-07-08 – 2015-07-09 (×2): via INTRAVENOUS

## 2015-07-04 MED ORDER — ATENOLOL 50 MG PO TABS
50.0000 mg | ORAL_TABLET | Freq: Every morning | ORAL | Status: DC
  Administered 2015-07-05: 50 mg via ORAL
  Filled 2015-07-04: qty 1

## 2015-07-04 MED ORDER — ADULT MULTIVITAMIN W/MINERALS CH
1.0000 | ORAL_TABLET | Freq: Every day | ORAL | Status: DC
  Administered 2015-07-05 – 2015-07-22 (×16): 1 via ORAL
  Filled 2015-07-04 (×16): qty 1

## 2015-07-04 NOTE — ED Provider Notes (Addendum)
CSN: [id number]     Arrival date & time 07/04/15  1048 History   First MD Initiated Contact with Patient 07/04/15 1133     Chief Complaint  Patient presents with  . Altered Mental Status     (Consider location/radiation/quality/duration/timing/severity/associated sxs/prior Treatment) Patient is a 70 y.o. male presenting with altered mental status. The history is provided by the patient.  Altered Mental Status Presenting symptoms: confusion and disorientation   Severity:  Moderate Most recent episode:  More than 2 days ago Episode history:  Continuous Duration:  3 days Timing:  Constant Progression:  Worsening Chronicity:  New Associated symptoms: no abdominal pain, no fever, no headaches, no palpitations, no rash and no vomiting    70 yo M with hx of cirrhosis s/p TIPs with AMS.  [facility] by [facility] started on lactulose with constipation for 3 days.  Confusion and sleeping more often than normal.  Deny infectious symptoms, deny abdominal pain. Seen at GI clinic today and sent to the ED for eval. Past Medical History  Diagnosis Date  . Hypertension     under control  . History of cancer chemotherapy   . Hx of radiation therapy 2002  . Gout     under control  . Umbilical hernia   . Fatty liver     "under control, being monitored"  . Esophageal cancer ([place]) 2002    esophagus removed  . Shortness of breath dyspnea   . GERD (gastroesophageal reflux disease)   . Alcoholic cirrhosis ([place])    Past Surgical History  Procedure Laterality Date  . Esophagus surgery  2002  . Incisional hernia repair  2004  . Tonsillectomy  as child  . Umbilical hernia repair N/A 06/14/2014    Procedure:  OPEN UMBILICAL HERNIA REPAIR;  Surgeon: Pedro Earls, MD;  Location: [facility];  Service: General;  Laterality: N/A;  . Tonsillectomy    . Radiology with anesthesia N/A 02/09/2015    Procedure: TIPS;  Surgeon: Jacqulynn Cadet, MD;  Location: [place];  Service: Radiology;  Laterality: N/A;   Family  History  Problem Relation Age of Onset  . Diabetes Father   . Diabetes Brother    Social History  Substance Use Topics  . Smoking status: Former Smoker -- 0.50 packs/day for 40 years    Types: Cigarettes    Quit date: 04/16/2004  . Smokeless tobacco: Never Used  . Alcohol Use: 4.2 oz/week    7 Glasses of wine per week     Comment: wine daily    Review of Systems  Constitutional: Positive for fatigue. Negative for fever and chills.  HENT: Negative for congestion and facial swelling.   Eyes: Negative for discharge and visual disturbance.  Respiratory: Negative for shortness of breath.   Cardiovascular: Negative for chest pain and palpitations.  Gastrointestinal: Negative for vomiting, abdominal pain and diarrhea.  Musculoskeletal: Negative for myalgias and arthralgias.  Skin: Negative for color change and rash.  Neurological: Negative for tremors, syncope and headaches.  Psychiatric/Behavioral: Positive for confusion. Negative for dysphoric mood.      Allergies  Amoxicillin  Home Medications   Prior to Admission medications   Medication Sig Start Date End Date Taking? Authorizing Provider  allopurinol (ZYLOPRIM) 300 MG tablet Take 300 mg by mouth daily.  12/20/12  Yes Historical Provider, MD  atenolol (TENORMIN) 50 MG tablet Take 50 mg by mouth every morning.   Yes Historical Provider, MD  ibuprofen (ADVIL,MOTRIN) 200 MG tablet Take 400 mg by mouth  every 6 (six) hours as needed for headache or moderate pain.   Yes Historical Provider, MD  lactulose (CHRONULAC) 10 GM/15ML solution Take 15 mLs by mouth daily. 06/23/15  Yes Historical Provider, MD  omeprazole (PRILOSEC) 20 MG capsule Take 20 mg by mouth daily.  01/18/13  Yes Historical Provider, MD   BP 128/74 mmHg  Pulse 58  Temp(Src) 98 F (36.7 C) (Oral)  Resp 18  Ht 6\' 2"  (1.88 m)  Wt 233 lb (105.688 kg)  BMI 29.90 kg/m2  SpO2 100% Physical Exam  Constitutional: He is oriented to person, place, and time. He appears  well-developed and well-nourished.  HENT:  Head: Normocephalic and atraumatic.  Eyes: EOM are normal. Pupils are equal, round, and reactive to light.  Neck: Normal range of motion. Neck supple. No JVD present.  Cardiovascular: Normal rate and regular rhythm.  Exam reveals no gallop and no friction rub.   No murmur heard. Pulmonary/Chest: No respiratory distress. He has no wheezes.  Abdominal: He exhibits no distension. There is no tenderness. There is no rebound and no guarding.  Genitourinary:     Musculoskeletal: Normal range of motion.  Neurological: He is alert and oriented to person, place, and time. GCS eye subscore is 4. GCS verbal subscore is 4. GCS motor subscore is 6.  Mildly confused on exam  Skin: No rash noted. No pallor.  jaundiced  Psychiatric: He has a normal mood and affect. His behavior is normal.  Nursing note and vitals reviewed.   ED Course  Procedures (including critical care time) Labs Review Labs Reviewed  COMPREHENSIVE METABOLIC PANEL - Abnormal; Notable for the following:    Sodium 131 (*)    Chloride 93 (*)    Creatinine, Ser 1.93 (*)    AST 256 (*)    ALT 66 (*)    Total Bilirubin 1.5 (*)    GFR calc non Af Amer 33 (*)    GFR calc Af Amer 38 (*)    All other components within normal limits  CBC - Abnormal; Notable for the following:    RBC 3.36 (*)    Hemoglobin 11.4 (*)    HCT 32.3 (*)    Platelets 112 (*)    All other components within normal limits  AMMONIA - Abnormal; Notable for the following:    Ammonia 96 (*)    All other components within normal limits  URINALYSIS, ROUTINE W REFLEX MICROSCOPIC (NOT AT [facility]) - Abnormal; Notable for the following:    Hgb urine dipstick SMALL (*)    Ketones, ur 15 (*)    All other components within normal limits  URINE MICROSCOPIC-ADD ON - Abnormal; Notable for the following:    Squamous Epithelial / LPF 0-5 (*)    Bacteria, UA RARE (*)    All other components within normal limits  I-STAT VENOUS  BLOOD GAS, ED - Abnormal; Notable for the following:    pH, Ven 7.411 (*)    pCO2, Ven 42.1 (*)    pO2, Ven 29.0 (*)    Bicarbonate 26.8 (*)    All other components within normal limits  ETHANOL  BLOOD GAS, VENOUS  CBG MONITORING, ED    Imaging Review Dg Chest 2 View  07/04/2015  CLINICAL DATA:  Altered mental status EXAM: CHEST  2 VIEW COMPARISON:  11/10/2009 FINDINGS: Cardiac enlargement. Pulmonary vascular congestion without edema. Hypoventilation with bibasilar atelectasis. IMPRESSION: Cardiac enlargement with pulmonary vascular congestion. Negative for edema Bibasilar atelectasis Electronically Signed   By: Juanda Crumble  Carlis Abbott M.D.   On: 07/04/2015 12:51   Ct Head Wo Contrast  07/04/2015  CLINICAL DATA:  Altered mental status. EXAM: CT HEAD WITHOUT CONTRAST TECHNIQUE: Contiguous axial images were obtained from the base of the skull through the vertex without intravenous contrast. COMPARISON:  None. FINDINGS: Mild to moderate atrophy. Mild chronic microvascular ischemic change in the white matter. No acute infarct. Negative for hemorrhage or mass. No shift of the midline structures. Normal calvarium. IMPRESSION: Atrophy and chronic white matter changes.  No acute abnormality. Electronically Signed   By: Franchot Gallo M.D.   On: 07/04/2015 13:00   I have personally reviewed and evaluated these images and lab results as part of my medical decision-making.   EKG Interpretation None      MDM   Final diagnoses:  Hepatic encephalopathy ([place])    70 yo M with AMS.  Infectious workup unremarkable, elevated ammonia levels. Admit for hepatic encephalopathy.   The patients results and plan were reviewed and discussed.   Any x-rays performed were independently reviewed by myself.   Differential diagnosis were considered with the presenting HPI.  Medications  atenolol (TENORMIN) tablet 50 mg (not administered)  ibuprofen (ADVIL,MOTRIN) tablet 400 mg (not administered)  heparin injection  5,000 Units (5,000 Units Subcutaneous Not Given 07/04/15 1844)  0.9 %  sodium chloride infusion ( Intravenous New Bag/Given 07/04/15 1844)  LORazepam (ATIVAN) tablet 1 mg (not administered)    Or  LORazepam (ATIVAN) injection 1 mg (not administered)  thiamine (VITAMIN B-1) tablet 100 mg ( Oral See Alternative 07/04/15 1843)    Or  thiamine (B-1) injection 100 mg (100 mg Intravenous Given [id number])  folic acid (FOLVITE) tablet 1 mg (1 mg Oral Not Given 07/04/15 1803)  multivitamin with minerals tablet 1 tablet (1 tablet Oral Not Given 07/04/15 1803)  allopurinol (ZYLOPRIM) tablet 300 mg (not administered)  pantoprazole (PROTONIX) EC tablet 40 mg (not administered)  lactulose (CHRONULAC) 10 GM/15ML solution 30 g (30 g Oral Given 07/04/15 1427)    Filed Vitals:   07/04/15 1430 07/04/15 1500 07/04/15 1530 07/04/15 1754  BP: 114/74 128/74 117/68 134/82  Pulse: 56 58 60 61  Temp:    97.5 F (36.4 C)  TempSrc:    Oral  Resp: 13 18 14 16   Height:    6\' 2"  (1.88 m)  Weight:    208 lb 12.4 oz (94.7 kg)  SpO2: 97% 100% 98% 92%    Final diagnoses:  Hepatic encephalopathy ([place])    Admission/ observation were discussed with the admitting physician, patient and/or family and they are comfortable with the plan.      Deno Etienne, DO 07/04/15 2116  Deno Etienne, DO 07/04/15 2116

## 2015-07-04 NOTE — Consult Note (Signed)
[facility] Consultation Note  Referring Provider: No ref. provider found Primary Care Physician:  Chesley Noon, MD Primary Gastroenterologist:  Dr. Laurence Spates  Reason for Consultation:  confusion  HPI: [name] is a 70 y.o. male with history of cirrhosis presenting with confusion.  He is unable to answer most questions coherently.  He is on lactulose which reportedly hasn't helped his confusion, but he has not had a bowel movement in 4 days.  Denies abdominal pain, but does endorse history of abdominal hernias for which he has had a couple surgeries.   No hematemesis, melena, hematochezia.  Dr. Oletta Lamas directed patient to ED.  No obvious narcotics or other recent mood-altering medications.   Past Medical History  Diagnosis Date  . Hypertension     under control  . History of cancer chemotherapy   . Hx of radiation therapy 2002  . Gout     under control  . Umbilical hernia   . Fatty liver     "under control, being monitored"  . Esophageal cancer ([place]) 2002    esophagus removed  . Shortness of breath dyspnea   . GERD (gastroesophageal reflux disease)   . Alcoholic cirrhosis ([place])     Past Surgical History  Procedure Laterality Date  . Esophagus surgery  2002  . Incisional hernia repair  2004  . Tonsillectomy  as child  . Umbilical hernia repair N/A 06/14/2014    Procedure:  OPEN UMBILICAL HERNIA REPAIR;  Surgeon: Pedro Earls, MD;  Location: [facility];  Service: General;  Laterality: N/A;  . Tonsillectomy    . Radiology with anesthesia N/A 02/09/2015    Procedure: TIPS;  Surgeon: Jacqulynn Cadet, MD;  Location: [place];  Service: Radiology;  Laterality: N/A;    Prior to Admission medications   Medication Sig Start Date End Date Taking? Authorizing Provider  allopurinol (ZYLOPRIM) 300 MG tablet Take 300 mg by mouth daily.  12/20/12  Yes Historical Provider, MD  atenolol (TENORMIN) 50 MG tablet Take 50 mg by mouth every morning.   Yes Historical Provider,  MD  ibuprofen (ADVIL,MOTRIN) 200 MG tablet Take 400 mg by mouth every 6 (six) hours as needed for headache or moderate pain.   Yes Historical Provider, MD  lactulose (CHRONULAC) 10 GM/15ML solution Take 15 mLs by mouth daily. 06/23/15  Yes Historical Provider, MD  omeprazole (PRILOSEC) 20 MG capsule Take 20 mg by mouth daily.  01/18/13  Yes Historical Provider, MD    Current Facility-Administered Medications  Medication Dose Route Frequency Provider Last Rate Last Dose  . 0.9 %  sodium chloride infusion   Intravenous Continuous Rogue Bussing, MD      . allopurinol (ZYLOPRIM) tablet 300 mg  300 mg Oral Daily Rogue Bussing, MD      . Derrill Memo ON 07/05/2015] atenolol (TENORMIN) tablet 50 mg  50 mg Oral q morning - 10a Hillary Corinda Gubler, MD      . folic acid (FOLVITE) tablet 1 mg  1 mg Oral Daily Hillary Corinda Gubler, MD      . heparin injection 5,000 Units  5,000 Units Subcutaneous 3 times per day Rogue Bussing, MD      . ibuprofen (ADVIL,MOTRIN) tablet 400 mg  400 mg Oral Q6H PRN Rogue Bussing, MD      . LORazepam (ATIVAN) tablet 1 mg  1 mg Oral Q6H PRN Rogue Bussing, MD       Or  . LORazepam (ATIVAN) injection 1 mg  1  mg Intravenous Q6H PRN Rogue Bussing, MD      . multivitamin with minerals tablet 1 tablet  1 tablet Oral Daily Hillary Corinda Gubler, MD      . pantoprazole (PROTONIX) EC tablet 40 mg  40 mg Oral Daily Hillary Corinda Gubler, MD      . thiamine (VITAMIN B-1) tablet 100 mg  100 mg Oral Daily Hillary Corinda Gubler, MD       Or  . thiamine (B-1) injection 100 mg  100 mg Intravenous Daily Hillary Corinda Gubler, MD       Current Outpatient Prescriptions  Medication Sig Dispense Refill  . allopurinol (ZYLOPRIM) 300 MG tablet Take 300 mg by mouth daily.     Marland Kitchen atenolol (TENORMIN) 50 MG tablet Take 50 mg by mouth every morning.    Marland Kitchen ibuprofen (ADVIL,MOTRIN) 200 MG tablet Take 400 mg by mouth every 6 (six) hours as  needed for headache or moderate pain.    Marland Kitchen lactulose (CHRONULAC) 10 GM/15ML solution Take 15 mLs by mouth daily.    Marland Kitchen omeprazole (PRILOSEC) 20 MG capsule Take 20 mg by mouth daily.       Allergies as of 07/04/2015 - Review Complete 07/04/2015  Allergen Reaction Noted  . Amoxicillin Diarrhea 03/05/2013    Family History  Problem Relation Age of Onset  . Diabetes Father   . Diabetes Brother     Social History   Social History  . Marital Status: Married    Spouse Name: N/A  . Number of Children: N/A  . Years of Education: N/A   Occupational History  . Not on file.   Social History Main Topics  . Smoking status: Former Smoker -- 0.50 packs/day for 40 years    Types: Cigarettes    Quit date: 04/16/2004  . Smokeless tobacco: Never Used  . Alcohol Use: 4.2 oz/week    7 Glasses of wine per week     Comment: wine daily  . Drug Use: No  . Sexual Activity: Not on file   Other Topics Concern  . Not on file   Social History Narrative    Review of Systems: Unable to obtain due to patient's altered mental status  Physical Exam: Vital signs in last 24 hours: Temp:  [98 F (36.7 C)] 98 F (36.7 C) (03/20 1121) Pulse Rate:  [54-60] 60 (03/20 1530) Resp:  [13-18] 14 (03/20 1530) BP: (93-128)/(63-80) 117/68 mmHg (03/20 1530) SpO2:  [97 %-100 %] 98 % (03/20 1530) Weight:  [105.688 kg (233 lb)] 105.688 kg (233 lb) (03/20 1121)   General:   Alert,but confused, older-appearing than stated age, NAD Head:  Normocephalic and atraumatic. Eyes:  Sclera icteric, Conjunctiva pink. Ears:  Normal auditory acuity. Nose:  No deformity, discharge,  or lesions. Mouth:  No deformity or lesions.  Oropharynx pink & moist. Neck:  Supple; no masses or thyromegaly. Lungs:  Clear throughout to auscultation.   No wheezes, crackles, or rhonchi. No acute distress. Heart:  Regular rate and rhythm; no murmurs, clicks, rubs,  or gallops. Abdomen:  Soft, midline incisional hernia versus diastasis  recti; distended and tympanic, No masses, hepatosplenomegaly or hernias noted. Normal bowel sounds, without guarding, and without rebound.     Msk:  Symmetrical without gross deformities. Normal posture. Pulses:  Normal pulses noted. Extremities:  2+ lower extremity edema bilateral lower extremities, psoriatic changes lower extremities, extensive onychomycosis lower extremities Neurologic:  Confused, doesn't answer questions appropriately. Skin:  Scattered telangiectasias, ecchymoses, multiple skin abrasions and excoriations,  otherwise intact without significant lesions or rashes. Psych:  Alert and cooperative. Awkward affect   Lab Results:  Recent Labs  07/04/15 1134  WBC 4.9  HGB 11.4*  HCT 32.3*  PLT 112*   BMET  Recent Labs  07/04/15 1134  NA 131*  K 3.8  CL 93*  CO2 26  GLUCOSE 93  BUN 19  CREATININE 1.93*  CALCIUM 9.6   LFT  Recent Labs  07/04/15 1134  PROT 6.8  ALBUMIN 3.7  AST 256*  ALT 66*  ALKPHOS 121  BILITOT 1.5*   PT/INR No results for input(s): LABPROT, INR in the last 72 hours.  Studies/Results: Dg Chest 2 View  07/04/2015  CLINICAL DATA:  Altered mental status EXAM: CHEST  2 VIEW COMPARISON:  11/10/2009 FINDINGS: Cardiac enlargement. Pulmonary vascular congestion without edema. Hypoventilation with bibasilar atelectasis. IMPRESSION: Cardiac enlargement with pulmonary vascular congestion. Negative for edema Bibasilar atelectasis Electronically Signed   By: Franchot Gallo M.D.   On: 07/04/2015 12:51   Ct Head Wo Contrast  07/04/2015  CLINICAL DATA:  Altered mental status. EXAM: CT HEAD WITHOUT CONTRAST TECHNIQUE: Contiguous axial images were obtained from the base of the skull through the vertex without intravenous contrast. COMPARISON:  None. FINDINGS: Mild to moderate atrophy. Mild chronic microvascular ischemic change in the white matter. No acute infarct. Negative for hemorrhage or mass. No shift of the midline structures. Normal calvarium.  IMPRESSION: Atrophy and chronic white matter changes.  No acute abnormality. Electronically Signed   By: Franchot Gallo M.D.   On: 07/04/2015 13:00    Impression:  1.  Altered mental status.  Certainly hepatic encephalopathy is a consideration, but other neurologic causes can't be entirely excluded. 2.  Cirrhosis, alcohol mediated, history TIPS for refractory ascites. 3.  Abdominal distention.  Suspect constipation versus ileus.  If he hasn't had bowel movements in 4 days, and confusion is related to hepatic encephalopathy, then that would explain why lactulose hasn't "worked" (works by acidifying stool and excreting proteins through stool via diarrhea).  Plan:  1.  Abdominal xray to evaluate bowel for constipation. 2.  Pending xray results, might need to consider lactulose enemas. 3.  If patient is still confused once he has at least 3-4 bowel movements per day, would consider neurologic consultation.  If mental status has improved once he's had 3-4 stools per day, might consider neurology consultation as inpatient. 4.  [facility] will follow.     Landry Dyke  07/04/2015, 4:21 PM  Pager [phone] If no answer or after 5 PM call [phone]

## 2015-07-04 NOTE — Progress Notes (Signed)
Attempted to call ED but no one answered

## 2015-07-04 NOTE — ED Notes (Signed)
Pt wife reports patient is a daily drinker and she has not allowed him to drink anything since last Thursday.

## 2015-07-04 NOTE — Progress Notes (Signed)
requested orders from the physician before patient is brought up to the unit. ED RN Earnest Bailey said she would page MD for orders

## 2015-07-04 NOTE — H&P (Signed)
[facility] Admission History and Physical Service Pager: [phone]  Patient name: [name] Medical record number: [id number] Date of birth: Sep 12, 1945 Age: 70 y.o. Gender: male  Primary Care Provider: Chesley Noon, MD Consultants: GI Code Status: Full  Chief Complaint: AMS  Assessment and Plan: [name] is a 70 y.o. male presenting with worsening encephalopathy. 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   Encephalopathy: History of alcoholic cirrhosis on lactulose but no bowel movement x 4 days. However, ammonia is lower than 4 days ago, and symptoms are worse. Patient's last drink was 4 days ago. Despite lack of agitation, tremors or tachycardia, wife reports him having hallucinations;  Wife initially had concern for stroke with onset of declining status 3 weeks ago. CT head negative for acute processes. Afebrile and no leukocytosis. Sore on penis does not look infected and most likely is from rubbing. Given changes in breathing with snoring while awake and possible weakness of soft palate, still have concern for stroke.  - Admitted to telemetry, attending Dr. Ree Kida. - Continuous cardiopulmonary monitoring - Obtain MRI brain w/o contrast to r/o stroke  - Restart home lactulose TID once cleared by SLP. Could also consider lactulose enema.  - Obtain RPR. Though sore unlikely to be present at stage of neurosyphilis.  - GI consulted for recommendations especially re: starting rifaximin --> Ordered abdominal x-ray to evaluate stool burden.  - CIWA protocol ordered  EtOH cirrhosis: EtOH < 5, AST 256, ALT 66, Ammonia 96  - GI consulted, appreciate recommendations. To use lactulose enemas if significant stool burden. - Home atenolol 50 mg daily ordered, but patient currently NPO.  AKI: Likely 2/2 decreased PO  intake, though wife says he has been drinking fluids. - MIVFs while NPO.  - Monitor with daily CMPs.   Stertor at rest: - Consider ENT consult in the a.m. - SLP consulted for swallow evaluation.  Penile ulcer: Does not look infected. Well-healing with scab. No leukocytosis.  - Obtain RPR - Obtain gc/chlamydia  S/p esophagectomy: - Protonix ordered as substitute for home prilosec.   FEN/GI: NPO until speech evaluation, MIVFs with NS @ 125 ml/hr Prophylaxis: heparin, protonix  Disposition: Admitted for observation and further work-up of AMS and   History of Present Illness:  [name] is a 70 y.o. male presenting with increased declining mental status for the past 4-5 days with slurring of speech, loss of balance, and increased sleeping, as well as auditory hallucinations and agitation that began last night (thought cat was making a lot of noise under the bed). History is reported by wife and patient, whose report is limited by impaired hearing and possible memory deficits. His wife first became concerned 3 weeks ago and thought he may have had a stroke due to balance issues and slurring of speech. She had him evaluated by his PCP who did not think he had a stroke. He last saw his gastroenterologist Dr. Oletta Lamas Thursday, 06/30/15, and was told to increase his lactulose to 15 mg TID, up from once daily. He has not had a bowel movement since then, however, and symptoms of decreased alertness have not improved. His wife reports that he is still actively drinking; she sees him have about 2 large glasses of wine with lunch and 3 large glasses of wine at dinner. Overall, she thinks he probably is consuming about 2 bottles of wine a day.  However his last drink was last Thursday. He has had an increased number of falls, but neither he nor his wife think he hit his head. He also has been having worsening snoring for the last few weeks, and now has audible sturdor while awake.   Patient denies any pain  or SOB. Wife reports decreased appetite but says he has been drinking a lot of water over the last few days. She also says he finished a course of ciprofloxacin yesterday, 07/03/15, for "infection seen in the blood." He says he has had a non-painful sore on his penis for the last six months, though his wife only noticed it yesterday when she was helping him shower.   In the ED, VSS. CT head showed mild/moderate atrophy but no acute changes. CXR showed bibasilar atelectasis. EtOH < 5, AST 256, ALT 66, Ammonia 96 (less than level of 138 four days ago), CBG 86, VBG 7.411, pCO2 42.1, pO2 29.0, bicarb 26.8. UA with small hgb and 15 ketones, no nitrites or leukocytes. SCr 1.93 (baseline ~1.2).  Review Of Systems: Per HPI with the following additions: No chest pain, no fevers, no n/v/d.  Otherwise the remainder of the systems were negative.  Patient Active Problem List   Diagnosis Date Noted  . Hepatic encephalopathy ([place]) 07/04/2015  . Acid indigestion 01/05/2015  . Cancer of esophagus ([place]) 01/05/2015  . Gout 01/05/2015  . S/P TIPS (transjugular intrahepatic portosystemic shunt)   . Ascites-treated 06/14/2014  . Abnormal magnetic resonance imaging study 05/16/2012  . Chronic cervical pain 05/16/2012  . Acute infection of nasal sinus 05/15/2012    Past Medical History: Past Medical History  Diagnosis Date  . Hypertension     under control  . History of cancer chemotherapy   . Hx of radiation therapy 2002  . Gout     under control  . Umbilical hernia   . Fatty liver     "under control, being monitored"  . Esophageal cancer ([place]) 2002    esophagus removed  . Shortness of breath dyspnea   . GERD (gastroesophageal reflux disease)   . Alcoholic cirrhosis ([place])     Past Surgical History: Past Surgical History  Procedure Laterality Date  . Esophagus surgery  2002  . Incisional hernia repair  2004  . Tonsillectomy  as child  . Umbilical hernia repair N/A 06/14/2014    Procedure:  OPEN  UMBILICAL HERNIA REPAIR;  Surgeon: Pedro Earls, MD;  Location: [facility];  Service: General;  Laterality: N/A;  . Tonsillectomy    . Radiology with anesthesia N/A 02/09/2015    Procedure: TIPS;  Surgeon: Jacqulynn Cadet, MD;  Location: [facility];  Service: Radiology;  Laterality: N/A;    Social History: Social History  Substance Use Topics  . Smoking status: Former Smoker -- 0.50 packs/day for 40 years    Types: Cigarettes    Quit date: 04/16/2004  . Smokeless tobacco: Never Used  . Alcohol Use: 4.2 oz/week    7 Glasses of wine per week     Comment: wine daily   Additional social history: Lives with wife and 2 cats. Wife reports he likely drinks 2 bottles of wine daily.  Please also refer to relevant sections of EMR.  Family History: Family History  Problem Relation Age of Onset  . Diabetes Father   . Diabetes Brother     Allergies and Medications: Allergies  Allergen Reactions  . Amoxicillin Diarrhea   No current facility-administered medications on file prior to encounter.  Current Outpatient Prescriptions on File Prior to Encounter  Medication Sig Dispense Refill  . allopurinol (ZYLOPRIM) 300 MG tablet Take 300 mg by mouth daily.     Marland Kitchen atenolol (TENORMIN) 50 MG tablet Take 50 mg by mouth every morning.    Marland Kitchen ibuprofen (ADVIL,MOTRIN) 200 MG tablet Take 400 mg by mouth every 6 (six) hours as needed for headache or moderate pain.    Marland Kitchen omeprazole (PRILOSEC) 20 MG capsule Take 20 mg by mouth daily.       Objective: BP 117/68 mmHg  Pulse 60  Temp(Src) 98 F (36.7 C) (Oral)  Resp 14  Ht 6\' 2"  (1.88 m)  Wt 233 lb (105.688 kg)  BMI 29.90 kg/m2  SpO2 98% Exam: General: Chronically ill appearing man, tired, resting in bed with wife at bedside Eyes: PERRL, EOMI, no ptosis ENTM: No nasal congestion, Mallampati Class 4, uvula visualized with tongue depressor and symmetrical Neck: FROM, supple Cardiovascular: RRR, S1, S2, no m/r/g Chest: Sturdor throughout, loudest over  upper airway. Moving good air throughout all lung fields, though decreased at bases.  Abdomen: +BS, S, NT, ND MSK: Normal bulk and tone. Moves all extremities spontaneously.  Skin: Psoriatic lesions across dorsal surfaces of feet bilaterally and across shins. 2cmx2cm healing excoriation of glans penis covered in scab. 1.5x1.5cm wound at left lateral epicondyle. Hyperpigmentation of skin/slight jaundice. Neuro: AOx2 (person, place; knew month and year but not date); CNII-XII grossly intact Psych: Flat affect. Cooperative with exam.   Labs and Imaging: CBC BMET   Recent Labs Lab 07/04/15 1134  WBC 4.9  HGB 11.4*  HCT 32.3*  PLT 112*    Recent Labs Lab 07/04/15 1134  NA 131*  K 3.8  CL 93*  CO2 26  BUN 19  CREATININE 1.93*  GLUCOSE 93  CALCIUM 9.6     EtOH < 5 AST 256 ALT 66 Ammonia 96 CBG 86  VBG 7.411, pCO2 42.1, pO2 29.0, bicarb 26.8 UA with small hgb and 15 ketones, no nitrites or leukocytes SCr 1.93 (baseline ~1.2)  Rogue Bussing, MD 07/04/2015, 4:07 PM PGY-1, [place] [facility] pager: [phone], text pages welcome

## 2015-07-04 NOTE — ED Notes (Signed)
Pt here with family for AMS and frequent falls; pt with hx of liver issues and on lactulose and has not had a BM since Thursday; pt sts hearing things per family

## 2015-07-04 NOTE — ED Notes (Signed)
Spoke with family medicine MD. MD reports she is putting in orders for this patient as we speak. Pt to be transferred to [facility].

## 2015-07-05 ENCOUNTER — Observation Stay (HOSPITAL_COMMUNITY): Payer: Medicare HMO

## 2015-07-05 DIAGNOSIS — N485 Ulcer of penis: Secondary | ICD-10-CM | POA: Diagnosis not present

## 2015-07-05 DIAGNOSIS — R001 Bradycardia, unspecified: Secondary | ICD-10-CM | POA: Diagnosis not present

## 2015-07-05 DIAGNOSIS — G9340 Encephalopathy, unspecified: Secondary | ICD-10-CM | POA: Diagnosis not present

## 2015-07-05 DIAGNOSIS — N179 Acute kidney failure, unspecified: Secondary | ICD-10-CM | POA: Diagnosis not present

## 2015-07-05 DIAGNOSIS — D6959 Other secondary thrombocytopenia: Secondary | ICD-10-CM | POA: Diagnosis not present

## 2015-07-05 DIAGNOSIS — K7040 Alcoholic hepatic failure without coma: Secondary | ICD-10-CM | POA: Diagnosis present

## 2015-07-05 DIAGNOSIS — Z923 Personal history of irradiation: Secondary | ICD-10-CM | POA: Diagnosis not present

## 2015-07-05 DIAGNOSIS — R402142 Coma scale, eyes open, spontaneous, at arrival to emergency department: Secondary | ICD-10-CM | POA: Diagnosis not present

## 2015-07-05 DIAGNOSIS — R140 Abdominal distension (gaseous): Secondary | ICD-10-CM | POA: Diagnosis not present

## 2015-07-05 DIAGNOSIS — J690 Pneumonitis due to inhalation of food and vomit: Secondary | ICD-10-CM | POA: Diagnosis not present

## 2015-07-05 DIAGNOSIS — J9602 Acute respiratory failure with hypercapnia: Secondary | ICD-10-CM

## 2015-07-05 DIAGNOSIS — R402242 Coma scale, best verbal response, confused conversation, at arrival to emergency department: Secondary | ICD-10-CM | POA: Diagnosis not present

## 2015-07-05 DIAGNOSIS — K7031 Alcoholic cirrhosis of liver with ascites: Secondary | ICD-10-CM

## 2015-07-05 DIAGNOSIS — J9692 Respiratory failure, unspecified with hypercapnia: Secondary | ICD-10-CM

## 2015-07-05 DIAGNOSIS — R1310 Dysphagia, unspecified: Secondary | ICD-10-CM | POA: Diagnosis not present

## 2015-07-05 DIAGNOSIS — F1010 Alcohol abuse, uncomplicated: Secondary | ICD-10-CM | POA: Diagnosis not present

## 2015-07-05 DIAGNOSIS — R471 Dysarthria and anarthria: Secondary | ICD-10-CM | POA: Diagnosis not present

## 2015-07-05 DIAGNOSIS — N189 Chronic kidney disease, unspecified: Secondary | ICD-10-CM | POA: Diagnosis not present

## 2015-07-05 DIAGNOSIS — I129 Hypertensive chronic kidney disease with stage 1 through stage 4 chronic kidney disease, or unspecified chronic kidney disease: Secondary | ICD-10-CM | POA: Diagnosis not present

## 2015-07-05 DIAGNOSIS — E162 Hypoglycemia, unspecified: Secondary | ICD-10-CM | POA: Diagnosis not present

## 2015-07-05 DIAGNOSIS — R339 Retention of urine, unspecified: Secondary | ICD-10-CM | POA: Diagnosis not present

## 2015-07-05 DIAGNOSIS — K7460 Unspecified cirrhosis of liver: Secondary | ICD-10-CM | POA: Diagnosis present

## 2015-07-05 DIAGNOSIS — D638 Anemia in other chronic diseases classified elsewhere: Secondary | ICD-10-CM | POA: Diagnosis not present

## 2015-07-05 DIAGNOSIS — K7290 Hepatic failure, unspecified without coma: Secondary | ICD-10-CM | POA: Diagnosis not present

## 2015-07-05 DIAGNOSIS — R402362 Coma scale, best motor response, obeys commands, at arrival to emergency department: Secondary | ICD-10-CM | POA: Diagnosis not present

## 2015-07-05 DIAGNOSIS — J9691 Respiratory failure, unspecified with hypoxia: Secondary | ICD-10-CM | POA: Diagnosis present

## 2015-07-05 DIAGNOSIS — Z6827 Body mass index (BMI) 27.0-27.9, adult: Secondary | ICD-10-CM | POA: Diagnosis not present

## 2015-07-05 DIAGNOSIS — Z8501 Personal history of malignant neoplasm of esophagus: Secondary | ICD-10-CM | POA: Diagnosis not present

## 2015-07-05 DIAGNOSIS — G4733 Obstructive sleep apnea (adult) (pediatric): Secondary | ICD-10-CM | POA: Diagnosis not present

## 2015-07-05 DIAGNOSIS — F10239 Alcohol dependence with withdrawal, unspecified: Secondary | ICD-10-CM | POA: Diagnosis not present

## 2015-07-05 DIAGNOSIS — J9601 Acute respiratory failure with hypoxia: Secondary | ICD-10-CM | POA: Diagnosis not present

## 2015-07-05 DIAGNOSIS — Z87891 Personal history of nicotine dependence: Secondary | ICD-10-CM | POA: Diagnosis not present

## 2015-07-05 DIAGNOSIS — Z23 Encounter for immunization: Secondary | ICD-10-CM | POA: Diagnosis not present

## 2015-07-05 DIAGNOSIS — A419 Sepsis, unspecified organism: Secondary | ICD-10-CM | POA: Diagnosis not present

## 2015-07-05 DIAGNOSIS — R6521 Severe sepsis with septic shock: Secondary | ICD-10-CM | POA: Diagnosis not present

## 2015-07-05 DIAGNOSIS — Z9221 Personal history of antineoplastic chemotherapy: Secondary | ICD-10-CM | POA: Diagnosis not present

## 2015-07-05 DIAGNOSIS — E876 Hypokalemia: Secondary | ICD-10-CM | POA: Diagnosis not present

## 2015-07-05 DIAGNOSIS — Z79899 Other long term (current) drug therapy: Secondary | ICD-10-CM | POA: Diagnosis not present

## 2015-07-05 DIAGNOSIS — K7030 Alcoholic cirrhosis of liver without ascites: Secondary | ICD-10-CM | POA: Diagnosis not present

## 2015-07-05 DIAGNOSIS — R0600 Dyspnea, unspecified: Secondary | ICD-10-CM | POA: Diagnosis not present

## 2015-07-05 DIAGNOSIS — E872 Acidosis: Secondary | ICD-10-CM | POA: Diagnosis not present

## 2015-07-05 DIAGNOSIS — J189 Pneumonia, unspecified organism: Secondary | ICD-10-CM | POA: Diagnosis not present

## 2015-07-05 DIAGNOSIS — H9190 Unspecified hearing loss, unspecified ear: Secondary | ICD-10-CM | POA: Diagnosis not present

## 2015-07-05 DIAGNOSIS — R4182 Altered mental status, unspecified: Secondary | ICD-10-CM | POA: Diagnosis present

## 2015-07-05 DIAGNOSIS — Z66 Do not resuscitate: Secondary | ICD-10-CM | POA: Diagnosis not present

## 2015-07-05 DIAGNOSIS — E43 Unspecified severe protein-calorie malnutrition: Secondary | ICD-10-CM | POA: Diagnosis not present

## 2015-07-05 DIAGNOSIS — K5900 Constipation, unspecified: Secondary | ICD-10-CM | POA: Diagnosis not present

## 2015-07-05 DIAGNOSIS — M109 Gout, unspecified: Secondary | ICD-10-CM | POA: Diagnosis not present

## 2015-07-05 DIAGNOSIS — E871 Hypo-osmolality and hyponatremia: Secondary | ICD-10-CM | POA: Diagnosis not present

## 2015-07-05 DIAGNOSIS — K219 Gastro-esophageal reflux disease without esophagitis: Secondary | ICD-10-CM | POA: Diagnosis not present

## 2015-07-05 LAB — TYPE AND SCREEN
ABO/RH(D): O POS
ANTIBODY SCREEN: NEGATIVE

## 2015-07-05 LAB — BLOOD GAS, ARTERIAL
Acid-base deficit: 1 mmol/L (ref 0.0–2.0)
Bicarbonate: 25.8 mEq/L — ABNORMAL HIGH (ref 20.0–24.0)
Drawn by: 398661
O2 Content: 2 L/min
O2 SAT: 79 %
PO2 ART: 57.3 mmHg — AB (ref 80.0–100.0)
Patient temperature: 98.6
TCO2: 27.9 mmol/L (ref 0–100)
pCO2 arterial: 65.8 mmHg (ref 35.0–45.0)
pH, Arterial: 7.218 — ABNORMAL LOW (ref 7.350–7.450)

## 2015-07-05 LAB — CBC
HEMATOCRIT: 30.1 % — AB (ref 39.0–52.0)
Hemoglobin: 10.3 g/dL — ABNORMAL LOW (ref 13.0–17.0)
MCH: 33.3 pg (ref 26.0–34.0)
MCHC: 34.2 g/dL (ref 30.0–36.0)
MCV: 97.4 fL (ref 78.0–100.0)
Platelets: 110 10*3/uL — ABNORMAL LOW (ref 150–400)
RBC: 3.09 MIL/uL — ABNORMAL LOW (ref 4.22–5.81)
RDW: 14.4 % (ref 11.5–15.5)
WBC: 3.3 10*3/uL — ABNORMAL LOW (ref 4.0–10.5)

## 2015-07-05 LAB — TRIGLYCERIDES: Triglycerides: 54 mg/dL (ref <150)

## 2015-07-05 LAB — BASIC METABOLIC PANEL
ANION GAP: 9 (ref 5–15)
BUN: 18 mg/dL (ref 6–20)
CHLORIDE: 97 mmol/L — AB (ref 101–111)
CO2: 25 mmol/L (ref 22–32)
Calcium: 8.3 mg/dL — ABNORMAL LOW (ref 8.9–10.3)
Creatinine, Ser: 1.62 mg/dL — ABNORMAL HIGH (ref 0.61–1.24)
GFR calc non Af Amer: 41 mL/min — ABNORMAL LOW (ref >60)
GFR, EST AFRICAN AMERICAN: 47 mL/min — AB (ref >60)
Glucose, Bld: 114 mg/dL — ABNORMAL HIGH (ref 65–99)
POTASSIUM: 3.9 mmol/L (ref 3.5–5.1)
SODIUM: 131 mmol/L — AB (ref 135–145)

## 2015-07-05 LAB — POCT I-STAT 3, ART BLOOD GAS (G3+)
ACID-BASE DEFICIT: 2 mmol/L (ref 0.0–2.0)
BICARBONATE: 25 meq/L — AB (ref 20.0–24.0)
O2 Saturation: 100 %
PCO2 ART: 47.8 mmHg — AB (ref 35.0–45.0)
PH ART: 7.321 — AB (ref 7.350–7.450)
PO2 ART: 223 mmHg — AB (ref 80.0–100.0)
Patient temperature: 97
TCO2: 26 mmol/L (ref 0–100)

## 2015-07-05 LAB — PHOSPHORUS: Phosphorus: 3.5 mg/dL (ref 2.5–4.6)

## 2015-07-05 LAB — PROTIME-INR
INR: 1.1 (ref 0.00–1.49)
Prothrombin Time: 14.4 seconds (ref 11.6–15.2)

## 2015-07-05 LAB — MAGNESIUM: MAGNESIUM: 1.3 mg/dL — AB (ref 1.7–2.4)

## 2015-07-05 LAB — APTT: aPTT: 40 seconds — ABNORMAL HIGH (ref 24–37)

## 2015-07-05 LAB — ABO/RH: ABO/RH(D): O POS

## 2015-07-05 LAB — MRSA PCR SCREENING: MRSA BY PCR: NEGATIVE

## 2015-07-05 LAB — GLUCOSE, CAPILLARY: GLUCOSE-CAPILLARY: 103 mg/dL — AB (ref 65–99)

## 2015-07-05 LAB — RPR: RPR: NONREACTIVE

## 2015-07-05 LAB — PROCALCITONIN

## 2015-07-05 LAB — LACTIC ACID, PLASMA: LACTIC ACID, VENOUS: 1.9 mmol/L (ref 0.5–2.0)

## 2015-07-05 MED ORDER — LORAZEPAM 2 MG/ML IJ SOLN
2.0000 mg | Freq: Once | INTRAMUSCULAR | Status: AC
  Administered 2015-07-05: 2 mg via INTRAVENOUS
  Filled 2015-07-05: qty 1

## 2015-07-05 MED ORDER — SODIUM CHLORIDE 0.9 % IV SOLN: 250.0000 mL | INTRAVENOUS | Status: DC | PRN

## 2015-07-05 MED ORDER — SODIUM CHLORIDE 0.9 % IV SOLN
0.0000 mg/h | INTRAVENOUS | Status: DC
  Administered 2015-07-05: 0.5 mg/h via INTRAVENOUS
  Administered 2015-07-07 – 2015-07-08 (×2): 3 mg/h via INTRAVENOUS
  Administered 2015-07-09: 4 mg/h via INTRAVENOUS
  Filled 2015-07-05 (×5): qty 10

## 2015-07-05 MED ORDER — FENTANYL CITRATE (PF) 100 MCG/2ML IJ SOLN
INTRAMUSCULAR | Status: AC
  Administered 2015-07-05: 100 ug
  Filled 2015-07-05: qty 4

## 2015-07-05 MED ORDER — MIDAZOLAM HCL 2 MG/2ML IJ SOLN
INTRAMUSCULAR | Status: AC
  Administered 2015-07-05: 2 mg
  Filled 2015-07-05: qty 4

## 2015-07-05 MED ORDER — CHLORHEXIDINE GLUCONATE 0.12% ORAL RINSE (MEDLINE KIT)
15.0000 mL | Freq: Two times a day (BID) | OROMUCOSAL | Status: DC
  Administered 2015-07-05 – 2015-07-13 (×15): 15 mL via OROMUCOSAL

## 2015-07-05 MED ORDER — BUDESONIDE 0.5 MG/2ML IN SUSP
0.5000 mg | Freq: Two times a day (BID) | RESPIRATORY_TRACT | Status: DC
  Administered 2015-07-05 – 2015-07-22 (×33): 0.5 mg via RESPIRATORY_TRACT
  Filled 2015-07-05 (×33): qty 2

## 2015-07-05 MED ORDER — MIDAZOLAM HCL 2 MG/2ML IJ SOLN
1.0000 mg | INTRAMUSCULAR | Status: DC | PRN
  Administered 2015-07-05: 1 mg via INTRAVENOUS
  Filled 2015-07-05: qty 2

## 2015-07-05 MED ORDER — FLUMAZENIL 0.5 MG/5ML IV SOLN
INTRAVENOUS | Status: AC
  Administered 2015-07-05: 0.1 mg
  Filled 2015-07-05: qty 5

## 2015-07-05 MED ORDER — ETOMIDATE 2 MG/ML IV SOLN
20.0000 mg | Freq: Once | INTRAVENOUS | Status: AC
  Administered 2015-07-05: 20 mg via INTRAVENOUS

## 2015-07-05 MED ORDER — LACTULOSE ENEMA
300.0000 mL | Freq: Once | ORAL | Status: AC
  Administered 2015-07-05: 300 mL via RECTAL
  Filled 2015-07-05: qty 300

## 2015-07-05 MED ORDER — FLUMAZENIL 0.5 MG/5ML IV SOLN: 0.2000 mg | Freq: Once | INTRAVENOUS | Status: DC

## 2015-07-05 MED ORDER — SODIUM CHLORIDE 0.9 % IV SOLN
Freq: Once | INTRAVENOUS | Status: AC
  Administered 2015-07-05: 20:00:00 via INTRAVENOUS

## 2015-07-05 MED ORDER — POLYETHYLENE GLYCOL 3350 17 G PO PACK
17.0000 g | PACK | Freq: Three times a day (TID) | ORAL | Status: DC
  Administered 2015-07-05 (×2): 17 g via ORAL
  Filled 2015-07-05 (×3): qty 1

## 2015-07-05 MED ORDER — PANTOPRAZOLE SODIUM 40 MG IV SOLR
40.0000 mg | Freq: Every day | INTRAVENOUS | Status: DC
  Administered 2015-07-05 – 2015-07-12 (×8): 40 mg via INTRAVENOUS
  Filled 2015-07-05 (×8): qty 40

## 2015-07-05 MED ORDER — MIDAZOLAM HCL 2 MG/2ML IJ SOLN
1.0000 mg | INTRAMUSCULAR | Status: DC | PRN
  Administered 2015-07-09 – 2015-07-11 (×2): 1 mg via INTRAVENOUS
  Filled 2015-07-05 (×2): qty 2

## 2015-07-05 MED ORDER — PROPOFOL 1000 MG/100ML IV EMUL
INTRAVENOUS | Status: AC
  Administered 2015-07-05: 1000 mg
  Filled 2015-07-05: qty 100

## 2015-07-05 MED ORDER — FENTANYL CITRATE (PF) 100 MCG/2ML IJ SOLN
50.0000 ug | INTRAMUSCULAR | Status: DC | PRN
  Administered 2015-07-08 – 2015-07-11 (×5): 50 ug via INTRAVENOUS
  Filled 2015-07-05 (×4): qty 2

## 2015-07-05 MED ORDER — ANTISEPTIC ORAL RINSE SOLUTION (CORINZ)
7.0000 mL | OROMUCOSAL | Status: DC
  Administered 2015-07-05 – 2015-07-11 (×56): 7 mL via OROMUCOSAL

## 2015-07-05 MED ORDER — IPRATROPIUM-ALBUTEROL 0.5-2.5 (3) MG/3ML IN SOLN
3.0000 mL | Freq: Four times a day (QID) | RESPIRATORY_TRACT | Status: DC
  Administered 2015-07-05 – 2015-07-14 (×35): 3 mL via RESPIRATORY_TRACT
  Filled 2015-07-05 (×35): qty 3

## 2015-07-05 MED ORDER — DEXTROSE 5 % IV SOLN
2.0000 g | INTRAVENOUS | Status: DC
  Administered 2015-07-05 – 2015-07-11 (×7): 2 g via INTRAVENOUS
  Filled 2015-07-05 (×9): qty 2

## 2015-07-05 MED ORDER — DM-GUAIFENESIN ER 30-600 MG PO TB12
1.0000 | ORAL_TABLET | Freq: Two times a day (BID) | ORAL | Status: DC
  Administered 2015-07-05: 1 via ORAL
  Filled 2015-07-05: qty 1

## 2015-07-05 MED ORDER — FLUMAZENIL 0.5 MG/5ML IV SOLN: 0.2000 mg | INTRAVENOUS | Status: AC

## 2015-07-05 MED ORDER — FENTANYL CITRATE (PF) 100 MCG/2ML IJ SOLN
50.0000 ug | INTRAMUSCULAR | Status: DC | PRN
  Filled 2015-07-05: qty 2

## 2015-07-05 MED ORDER — SODIUM CHLORIDE 0.9 % IV BOLUS (SEPSIS)
1000.0000 mL | Freq: Once | INTRAVENOUS | Status: AC
Start: 2015-07-05 — End: 2015-07-06
  Administered 2015-07-05: 1000 mL via INTRAVENOUS

## 2015-07-05 NOTE — Progress Notes (Signed)
Patient is currently confused and is pulling off tele monitor box.  I notified CCMD if what is going on.

## 2015-07-05 NOTE — Progress Notes (Signed)
When patient woke up this morning, he stated that his Left hearing aid was gone. Patient stated that he had lost his hearing aid when being transferred between beds from the ED to [facility] or during his MRI, but was not sure where. Staffing on [facility] searched linens and the patient's room and could not find the patient's left hearing aid. RN called ED and MRI to see if a hearing aid had been found. Staff in the ED and MRI said they would continue to search for the hearing aid.

## 2015-07-05 NOTE — Progress Notes (Signed)
Subjective: No complaints. Answers questions appropriately. Still no bowel movement.  Objective: Vital signs in last 24 hours: Temp:  [97.4 F (36.3 C)-98 F (36.7 C)] 97.7 F (36.5 C) (03/21 0526) Pulse Rate:  [54-62] 62 (03/21 0526) Resp:  [13-18] 18 (03/21 0526) BP: (93-134)/(62-82) 133/71 mmHg (03/21 0526) SpO2:  [92 %-100 %] 99 % (03/21 0526) Weight:  [94.7 kg (208 lb 12.4 oz)-105.688 kg (233 lb)] 94.7 kg (208 lb 12.4 oz) (03/20 1754) Weight change:  Last BM Date:  (PTA)  PE: GEN:  Obese, jaundiced, NAD ABD:  Protuberant, bit distended and tympanic, non-tender, active bowel sounds, old surgical scars NEURO:  No asterixis.  Oriented to person, place, time.  Lab Results: CBC    Component Value Date/Time   WBC 4.9 07/04/2015 1134   WBC 3.2* 08/03/2005 0903   RBC 3.36* 07/04/2015 1134   RBC 3.49* 08/03/2005 0903   HGB 11.4* 07/04/2015 1134   HGB 11.9* 08/03/2005 0903   HCT 32.3* 07/04/2015 1134   HCT 34.2* 08/03/2005 0903   PLT 112* 07/04/2015 1134   PLT 208 08/03/2005 0903   MCV 96.1 07/04/2015 1134   MCV 98.1* 08/03/2005 0903   MCH 33.9 07/04/2015 1134   MCH 34.1* 08/03/2005 0903   MCHC 35.3 07/04/2015 1134   MCHC 34.8 08/03/2005 0903   RDW 14.2 07/04/2015 1134   RDW 13.4 08/03/2005 0903   LYMPHSABS 0.4* 08/03/2005 0903   MONOABS 0.4 08/03/2005 0903   EOSABS 0.1 08/03/2005 0903   BASOSABS 0.0 08/03/2005 0903   CMP     Component Value Date/Time   NA 131* 07/04/2015 1134   K 3.8 07/04/2015 1134   CL 93* 07/04/2015 1134   CO2 26 07/04/2015 1134   GLUCOSE 93 07/04/2015 1134   BUN 19 07/04/2015 1134   CREATININE 1.93* 07/04/2015 1134   CREATININE 1.77* 06/30/2015 1227   CALCIUM 9.6 07/04/2015 1134   PROT 6.8 07/04/2015 1134   ALBUMIN 3.7 07/04/2015 1134   AST 256* 07/04/2015 1134   ALT 66* 07/04/2015 1134   ALKPHOS 121 07/04/2015 1134   BILITOT 1.5* 07/04/2015 1134   GFRNONAA 33* 07/04/2015 1134   GFRNONAA 38* 06/30/2015 1227   GFRAA 38* 07/04/2015  1134   GFRAA 43* 06/30/2015 1227   Assessment:  1.  Confusion.  Seems pretty alert and oriented today.  Unclear if some interval confusion might have been due to constipation and ineffective fecal clearance of amino acids. 2.  Alcohol-mediated cirrhosis. 3.  Refractory ascites, s/p TIPS. 4.  Renal insufficiency.  Plan:  1.  Miralax 17 g po tid to help with bowel movements. 2.  Once patient starts having bowel movements, will try to resume lactulose therapy. 3.  Neurology evaluation for confusion, per primary team. 4.  [facility] will follow.  Not quite sure how much more testing is necessary at the present time, as he is alert and oriented today, but would defer further evaluation to neurology consultants and primary team.   Landry Dyke 07/05/2015, 9:33 AM   Pager [phone] If no answer or after 5 PM call [phone]

## 2015-07-05 NOTE — Progress Notes (Addendum)
Patient's breathing has been very congested sounding more like a snoring sounding but i was told by his wife that has been going on for 2 months now.  During the morning assessment patient was alert and oriented times 4 but upon reassessment at 1022 patient was more confused and only alert and oriented x2.   At 1500 Patient began becoming agitated and more restless.  Neuro assessment showed alert and oriented x2 with not knowing where he was or the day.  Reassessed CIWA which was an 8.  Md paged and instructed to give 1mg  ativan.  We moved patient to a camera room and got him a low bed with pads for the floor because patient was trying to get out of bed.  Patient became more agitated and restlessness breathing remained the same as it was on admission yesterday.  Paged MD for further orders. Received 2 mg ativan and lactulose enema.  Patient's lactulose was 98 and has a history of hepatic encephalopathy.  During the enema patient was positioned on his side to which patient began spitting up blood.  Charge RN asked to come for a second assessment and together we called rapid response RN.   Patient continued to become more restless and neuro status became to only responsive to voice at times.  Sternal rub preformed with minimal response.  Family medicine was called to come assist.  Rapid RN came to bed side at 1900.

## 2015-07-05 NOTE — Progress Notes (Signed)
Paged by RN around 6:50pm. Patient now noted to have blood coming from his mouth (she noticed this while turning him to give him the lactulose enema that was previously ordered.)  The patient has been agitated, attempting to get out of the bed. He was given Ativan 2mg  at [place] in hopes of calming him down so that he could get lactulose (he has missed this for several days). He was previously given Ativan 1mg  and tolerated this well.  On my arrival a rapid response has been called.   On exam the patient has bright red blood in his oropharynx. He is snoring loudly (at his baseline from admission). He does not respond to painful stimuli. He has minimal gag to suction. PERRL.  He has an abrasion on his tongue. When rapid response RN tries to place an oral airway to displace his tongue, it comes out and there is blood and some yellow drainage.    Initially he is satting 88% on RA, however has increased work of breathing. Place him on a non-rebreather and satting 100% with continued WOB.  Obtained an ABG. RN denies any seizure like activity.   Consulted ICU as this patient is not a BiPAP candidate.  Gave flumazenil 0.2mg . Patient moving extremities after this, however still not opening eyes or following commands. I do not think Ativan was the etiology of his decline. Possibly due to hepatic encephalopathy. His last drink was 3/16 therefore this could be secondary to withdrawal as well. Has had MRI while he was more altered last evening- would be odd for an intercranial change given no falls reported however not completely ruled out.   ICU at bedside for transfer.   I updated the patient's wife, Mikle Bosworth on the situation. She is ok with intubation and confirms full code status. She is glad that he's being transferred to the ICU so he can get more attention. She will plan on visiting him in the AM. She can be reached at [phone].  Archie Patten, MD [facility] Family Medicine Resident  07/05/2015, 7:45  PM

## 2015-07-05 NOTE — Significant Event (Signed)
Rapid Response Event Note  Overview:  Called to see patient with worsening resp status and change in mental status Time Called: 1844 Arrival Time: 1846 Event Type: Respiratory, Neurologic  Initial Focused Assessment:  On arrival patient supine in bed - skin warm and dry - snoring resps - tongue very dry - falling to back of throat - some small areas of ecchymosis noted - staff unaware if he has bitten his tongue prior to this event - bil BS coarse - lots of upper airway noise - no response to loud voice or sternal rub - RN reports patient was talking this AM - had some confusion around 1200 then increasingly agitated - was given Ativan per CIWA protocol.  Since that time snoring has worsened.  Obtunded on my exam.  O2 sats 92% on 2 liter nasal cannula.  Abd large soft.  BP 138/63  Placed on telemetry with HR 78.  RR 28.  CBG 103.     Interventions:  Stat call to RT for ABG and oral airway.  Dr. Lorenso Courier to bedside.  Placed oral airway - patient gagging and  coughed moderate amount thick milky yellow secretions - some bleeding noted also - probable oral in nature - airway removed. Placed NRB mask.   HOB up for airway protection.   Romazicon 0.2mg  IV given per MD order.  After few minutes spont extremity movement  - no eye opening or following of commands. Sats 100% on NRB mask.   Call to PCCM by Dr. Lorenso Courier - ICU bed requested.  Dr. Corrie Dandy and Georgann Housekeeper NP to bedside.  ABG reviewed.  Stat transfer to 2H13 with RT and RN"s - patient intubated at bedside in 2H13 - assisted staff with intubation and stabalization.  Handoff to [facility].     Event Summary: Name of Physician Notified: Dr. Lorenso Courier at  (PTA RRT arrival)  Name of Consulting Physician Notified: PCCM Dr. Corrie Dandy at 1902  Outcome: Transferred (Comment)  Event End Time: 2000  Quin Hoop

## 2015-07-05 NOTE — Progress Notes (Signed)
[facility] Progress Note Patient Name: [name] DOB: 09-Nov-1945 MRN: [id number]   Date of Service  07/05/2015  HPI/Events of Note  Hypotension and bradycardia - BP = 77/58 and HR = 58. Patient is on a Propofol IV infusion for sedation post intubation.   eICU Interventions  Will order: 1. Bolus with 0.9 NaCl 1 liter IV over 1 hour now.  2. D/C Propofol IV infusion.  3. Versed IV infusion. Titrate to RASS = 0 to -1.     Intervention Category Major Interventions: Hypotension - evaluation and management;Arrhythmia - evaluation and management  [name],[name] 07/05/2015, 11:15 PM

## 2015-07-05 NOTE — Care Management Obs Status (Signed)
[facility] NOTIFICATION   Patient Details  Name: [name] MRN: [id number] Date of Birth: 1945-07-03   Medicare Observation Status Notification Given:  Yes    Carles Collet, RN 07/05/2015, 11:49 AM

## 2015-07-05 NOTE — Progress Notes (Signed)
Family Medicine Teaching Service Daily Progress Note Intern Pager: [phone]  Patient name: [name] Medical record number: [id number] Date of birth: [birth year] Age: 70 y.o. Gender: male  Primary Care Provider: Chesley Noon, MD Consultants: GI Code Status: Full  Pt Overview and Major Events to Date:  3/20: Admitted for AMS/encephalopathy  Assessment and Plan: [name] is a 70 y.o. male presenting with worsening encephalopathy. 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   Encephalopathy: History of alcoholic cirrhosis on lactulose but no bowel movement x 4 days. However, ammonia is lower than 4 days ago, and symptoms are worse. Patient's last drink was 4 days ago. Despite lack of agitation, tremors or tachycardia, wife reports him having hallucinations; Wife initially had concern for stroke with onset of declining status 3 weeks ago. CT head negative for acute processes. Afebrile and no leukocytosis. Sore on penis does not look infected and most likely is from rubbing. Given changes in breathing with snoring while awake and possible weakness of soft palate, had concern for stroke.  - Admitted to telemetry with continuous cardiopulmonary monitoring - MRI/MRA head without evidence of stroke but with significant narrowing of cerebral and cerebellar arteries, hyperintensity of globus pallidus c/w hepatic encephalopathy - RPR nonreactive.   - GI consulted for recommendations especially re: starting rifaximin --> Ordered abdominal x-ray to evaluate stool burden. Recommended bowel clean-out with miralax TID, then to resume TID lactulose once having bowel movements; do not start rifaximin at this time - CIWA protocol ordered --> scores 0  EtOH cirrhosis: EtOH < 5, AST 256, ALT 66, Ammonia 96  - GI consulted, appreciate recommendations. To use  lactulose enemas if significant stool burden. - Home atenolol 50 mg daily ordered, but patient currently NPO.  AKI: Likely 2/2 decreased PO intake, though wife says he has been drinking fluids. - Discontinue IVFs once taking good PO - Order a.m. CMP  Stertor at rest: - Consider ENT consult in the a.m. --> recommended outpatient sleep study - SLP consulted for swallow evaluation --> passed; regular diet ordered  Penile ulcer: Does not look infected. Well-healing with scab. No leukocytosis.  - RPR nonreactive - Obtain gc/chlamydia  S/p esophagectomy: - Protonix ordered as substitute for home prilosec.   FEN/GI: Regular diet, MIVFs with NS @ 125 ml/hr Prophylaxis: heparin, protonix  Disposition: Admitted for observation and further work-up of increased somnolence  Subjective:  [name] has no complaints this morning. He denies pain. He was asking for a cup of coffee. Still has not had a bowel movement.  Objective: Temp:  [97.4 F (36.3 C)-98 F (36.7 C)] 97.7 F (36.5 C) (03/21 0526) Pulse Rate:  [54-62] 62 (03/21 0526) Resp:  [13-18] 18 (03/21 0526) BP: (93-134)/(62-82) 133/71 mmHg (03/21 0526) SpO2:  [92 %-100 %] 99 % (03/21 0526) Weight:  [208 lb 12.4 oz (94.7 kg)-233 lb (105.688 kg)] 208 lb 12.4 oz (94.7 kg) (03/20 1754) Physical Exam: General: Chronically ill appearing man, tired, resting in recliner at bedside Eyes: PERRL, EOMI, no ptosis ENTM: Mallampati Class 4, MMM Cardiovascular: RRR, S1, S2, no m/r/g Chest: Noisy breathing, loudest over upper airway. Moving good air throughout all lung fields. Abdomen: +BS, S, NT, distended and hyper-resonnant  Neuro: AOx3 (person, place; month and year); CNII-XII grossly intact. Slight pronator drift.  Psych: Flat affect. Cooperative with exam.   Laboratory:  Recent Labs Lab 06/30/15 1227 07/04/15 1134  WBC  6.9 4.9  HGB 11.6* 11.4*  HCT 32.6* 32.3*  PLT 116* 112*    Recent Labs Lab 06/30/15 1227 07/04/15 1134   NA 127* 131*  K 4.3 3.8  CL 88* 93*  CO2 26 26  BUN 20 19  CREATININE 1.77* 1.93*  CALCIUM 9.0 9.6  PROT 6.8 6.8  BILITOT 1.3* 1.5*  ALKPHOS 136* 121  ALT 66* 66*  AST 369* 256*  GLUCOSE 95 93    Imaging/Diagnostic Tests: Dg Chest 2 View  07/04/2015  IMPRESSION: Cardiac enlargement with pulmonary vascular congestion. Negative for edema Bibasilar atelectasis Electronically Signed   By: Franchot Gallo M.D.   On: 07/04/2015 12:51   Ct Head Wo Contrast  07/04/2015  IMPRESSION: Atrophy and chronic white matter changes.  No acute abnormality. Electronically Signed   By: Franchot Gallo M.D.   On: 07/04/2015 13:00   Mr Jodene Nam Head Wo Contrast  07/04/2015  IMPRESSION: MRI HEAD Exam is motion degraded. No acute infarct or intracranial hemorrhage. T1 increased hyperintensity globus pallidus. This has been described in patients with hepatic encephalopathy. Mild chronic small vessel disease changes. Global atrophy without hydrocephalus. No intracranial mass lesion noted on this unenhanced exam. Spinal stenosis C3-4 suspected although incompletely assessed secondary to motion. Degenerative changes C1-2. MRA HEAD Exam is motion degraded. Right vertebral artery is occluded. Mild to moderate narrowing proximal basilar artery. Nonvisualized posterior inferior cerebellar arteries and left anterior inferior cerebellar artery with only small portion of the right anterior inferior cerebellar artery visualized. Small superior cerebellar arteries bilaterally. Narrowing distal posterior cerebral artery branches bilaterally. Artifact pre cavernous segment internal carotid artery greater on left limits evaluation for detection of stenosis in this region. Mild irregularity cavernous segment internal carotid artery bilaterally. Poor delineation of a majority of the middle cerebral artery branches bilaterally which may be narrowed. Electronically Signed   By: Genia Del M.D.   On: 07/04/2015 18:10   Mr Brain Wo  Contrast  07/04/2015  IMPRESSION: MRI HEAD Exam is motion degraded. No acute infarct or intracranial hemorrhage. T1 increased hyperintensity globus pallidus. This has been described in patients with hepatic encephalopathy. Mild chronic small vessel disease changes. Global atrophy without hydrocephalus. No intracranial mass lesion noted on this unenhanced exam. Spinal stenosis C3-4 suspected although incompletely assessed secondary to motion. Degenerative changes C1-2. MRA HEAD Exam is motion degraded. Right vertebral artery is occluded. Mild to moderate narrowing proximal basilar artery. Nonvisualized posterior inferior cerebellar arteries and left anterior inferior cerebellar artery with only small portion of the right anterior inferior cerebellar artery visualized. Small superior cerebellar arteries bilaterally. Narrowing distal posterior cerebral artery branches bilaterally. Artifact pre cavernous segment internal carotid artery greater on left limits evaluation for detection of stenosis in this region. Mild irregularity cavernous segment internal carotid artery bilaterally. Poor delineation of a majority of the middle cerebral artery branches bilaterally which may be narrowed. Electronically Signed   By: Genia Del M.D.   On: 07/04/2015 18:10   Dg Abd 2 Views  07/04/2015  IMPRESSION: No acute abnormality is noted.  Scattered fecal material is seen. Electronically Signed   By: Inez Catalina M.D.   On: 07/04/2015 18:31    Tony Babic Corinda Gubler, MD 07/05/2015, 8:24 AM PGY-1, [place] Intern pager: [phone], text pages welcome

## 2015-07-05 NOTE — Progress Notes (Signed)
Patient had become increasingly confused, agitated, irritable, and uncooperative.  Called MD about patient status.  He said to remove tele at this time and to reassess CWIA.  CWIA is at an 8 and he is showing signs of withdrawal so 1mg  of ativan will be given.

## 2015-07-05 NOTE — Procedures (Signed)
Intubation Procedure Note [name] [id number] 07/26/1945  Procedure: Intubation Indications: Airway protection and maintenance  Procedure Details Consent: Unable to obtain consent because of emergent medical necessity. Time Out: Verified patient identification, verified procedure, site/side was marked, verified correct patient position, special equipment/implants available, medications/allergies/relevent history reviewed, required imaging and test results available.  Performed  Maximum sterile technique was used including gloves, hand hygiene and mask.  MAC and 3  With Glidescope use, patient had blood per his mouth. Blood was suctioned out. Airway was class IV -- difficult intubation.  I was able to insert the ET tube, size 7-1/2, with gleidoscope  Visualization.  Patient received : Versed 2 mg, fentanyl 100 mcg, Etomidate 20 mg >> all IV in divided doses.   Evaluation Hemodynamic Status: Transient hypotension treated with fluid; O2 sats: stable throughout Patient's Current Condition: stable Complications: No apparent complications Patient did tolerate procedure well. Chest X-ray ordered to verify placement.  CXR: tube position acceptable.   [place] 07/05/2015

## 2015-07-05 NOTE — Progress Notes (Signed)
Hearing aid has been found.  It is in the patients ear

## 2015-07-05 NOTE — Progress Notes (Addendum)
MD paged to ask about removing tele because patient would not keep tele monitor on

## 2015-07-05 NOTE — Evaluation (Signed)
Clinical/Bedside Swallow Evaluation Patient Details  Name: [name] MRN: [id number] Date of Birth: 1945/10/14  Today's Date: 07/05/2015 Time: SLP Start Time (ACUTE ONLY): 0909 SLP Stop Time (ACUTE ONLY): 0923 SLP Time Calculation (min) (ACUTE ONLY): 14 min  Past Medical History:  Past Medical History  Diagnosis Date  . Hypertension     under control  . History of cancer chemotherapy   . Hx of radiation therapy 2002  . Gout     under control  . Umbilical hernia   . Fatty liver     "under control, being monitored"  . Esophageal cancer ([place]) 2002    esophagus removed  . Shortness of breath dyspnea   . GERD (gastroesophageal reflux disease)   . Alcoholic cirrhosis ([place])    Past Surgical History:  Past Surgical History  Procedure Laterality Date  . Esophagus surgery  2002  . Incisional hernia repair  2004  . Tonsillectomy  as child  . Umbilical hernia repair N/A 06/14/2014    Procedure:  OPEN UMBILICAL HERNIA REPAIR;  Surgeon: Pedro Earls, MD;  Location: [facility];  Service: General;  Laterality: N/A;  . Tonsillectomy    . Radiology with anesthesia N/A 02/09/2015    Procedure: TIPS;  Surgeon: Jacqulynn Cadet, MD;  Location: [facility];  Service: Radiology;  Laterality: N/A;   HPI:  70 y/o male with PMH alcoholic liver cirrhosis s/p TIPS, history of Esophogeal SCC s/p total esophagectomy, HTN, and Gout presents with worsening encephalopathy/AMS. His wife has noted increased hallucinations, altered mental status, and unsteadiness while ambulating over the past few days. CXR shows bibasilar atelectasis. MRI shows no acute infarct or intracranial hemorrhage.   Assessment / Plan / Recommendation Clinical Impression  Pt's speech is slurred and hypophonic with questionable sluggish movement of velum, although it is difficult to adequately visualize around his tongue. His swallow appears to occur swiftly and with adequate hyolaryngeal movement upon palpation. Wet vocal quality noted x1  after challenging with the 3 ounce water test, which cleared with Mod cues for strong cough. No overt coughing or throat clearing noted across intake. Recommend to initiate regular diet and thin liquids, although educated pt on the use of aspiration precautions. SLP to f/u briefly for tolerance given the above.    Aspiration Risk  Mild aspiration risk    Diet Recommendation Regular;Thin liquid   Liquid Administration via: Cup;Straw Medication Administration: Whole meds with liquid Supervision: Patient able to self feed;Intermittent supervision to cue for compensatory strategies Compensations: Minimize environmental distractions;Slow rate;Small sips/bites;Follow solids with liquid Postural Changes: Seated upright at 90 degrees;Remain upright for at least 30 minutes after po intake    Other  Recommendations Oral Care Recommendations: Oral care BID   Follow up Recommendations   (tba)    Frequency and Duration min 2x/week  1 week       Prognosis        Swallow Study   General HPI: 70 y/o male with PMH alcoholic liver cirrhosis s/p TIPS, history of Esophogeal SCC s/p total esophagectomy, HTN, and Gout presents with worsening encephalopathy/AMS. His wife has noted increased hallucinations, altered mental status, and unsteadiness while ambulating over the past few days. CXR shows bibasilar atelectasis. MRI shows no acute infarct or intracranial hemorrhage. Type of Study: Bedside Swallow Evaluation Previous Swallow Assessment: none in chart Diet Prior to this Study: NPO Temperature Spikes Noted: No Respiratory Status: Room air History of Recent Intubation: No Behavior/Cognition: Alert;Cooperative;Pleasant mood;Requires cueing Oral Cavity Assessment: Other (comment) (difficult to visualize  around tongue) Oral Care Completed by SLP: No Oral Cavity - Dentition: Adequate natural dentition Vision: Functional for self-feeding Self-Feeding Abilities: Able to feed self;Needs assist Patient  Positioning: Upright in chair Baseline Vocal Quality: Other (comment) (hypophonic) Volitional Cough: Weak Volitional Swallow: Able to elicit    Oral/Motor/Sensory Function Overall Oral Motor/Sensory Function:  (?sluggish velum, difficult to visualize)   Ice Chips Ice chips: Not tested   Thin Liquid Thin Liquid: Impaired Presentation: Cup;Self Fed;Straw Pharyngeal  Phase Impairments: Wet Vocal Quality (x1)    Nectar Thick Nectar Thick Liquid: Not tested   Honey Thick Honey Thick Liquid: Not tested   Puree Puree: Within functional limits Presentation: Self Fed;Spoon   Solid   GO   Solid: Within functional limits Presentation: Self Fed    Functional Assessment Tool Used: skilled clinical judgment Functional Limitations: Swallowing Swallow Current Status [id number]): At least 1 percent but less than 20 percent impaired, limited or restricted Swallow Goal Status [phone]): At least 1 percent but less than 20 percent impaired, limited or restricted   Germain Osgood, M.A. CCC-SLP [phone]  Germain Osgood 07/05/2015,9:40 AM

## 2015-07-05 NOTE — Progress Notes (Signed)
Called by RN. Patient agitated compared to this AM.  He's almost fell out of the bed. He it is hard to understand. His most recent CIWA was 8. He was given Ativan for this and she noted it calmed him down a few hours ago.   Will give a 1 time does of Ativan 2mg  then continue to monitor.  Archie Patten, MD [facility] Family Medicine Resident  07/05/2015, 6:06 PM

## 2015-07-05 NOTE — Consult Note (Signed)
PULMONARY / CRITICAL CARE MEDICINE   Name: [name] MRN: [id number] DOB: [birth year]    ADMISSION DATE:  07/04/2015 CONSULTATION DATE:  07/05/2015  REFERRING MD:  Family Medicine  CHIEF COMPLAINT:  AMS   HISTORY OF PRESENT ILLNESS:   Patient is encephalopathic so no history was obtained from him. Chart review was done as well as discussing the case with the primary team taking care of him.  [name] is a 70 y.o. male presenting with increased declining mental status for the past 4-5 days with slurring of speech, loss of balance, and increased sleeping, as well as auditory hallucinations and agitation. He last saw his gastroenterologist Dr. Oletta Lamas Thursday, 06/30/15, and was told to increase his lactulose to 15 mg TID, up from once daily. He has not had a bowel movement since then, however, and symptoms of decreased alertness have not improved. His wife reports that he is still actively drinking -- last drink was Thursday.   Patient recently finished a course of ciprofloxacin for "infection in the blood".   In the ED, VSS. CT head showed mild/moderate atrophy but no acute changes. CXR showed bibasilar atelectasis. EtOH < 5, AST 256, ALT 66, Ammonia 96 (less than level of 138 four days ago), CBG 86, VBG 7.411, pCO2 42.1, pO2 29.0, bicarb 26.8. UA with small hgb and 15 ketones, no nitrites or leukocytes. SCr 1.93 (baseline ~1.2).  GI service was consulted. GI was concerned about hepatic encephalopathy. Patient was also constipated.  On 3/21 -- patient's mental status progressively declined. He became somnolent. He ended up getting 2 mg of Ativan which made him more somnolent. Per nursing staff, he was also biting on his tongue all day and had a bloody mouth all day. Patient received Romazicon with no effect. PCCM consulted for respiratory failure. Repeat ABG 7.2, PCO2 66, PO2 57 on 2 L.  Patient transferred to ICU and was intubated. On glidoscope use, he had a lot of blood per orem  which was suctioned out. He did NOT bleed more with intubation.  They tried placing OGT which caused more bleeding -- we immediately stopped OGT insertion. OGT was only per orem.    PAST MEDICAL HISTORY :  He  has a past medical history of Hypertension; History of cancer chemotherapy; radiation therapy (2002); Gout; Umbilical hernia; Fatty liver; Esophageal cancer ([place]) (2002); Shortness of breath dyspnea; GERD (gastroesophageal reflux disease); and Alcoholic cirrhosis ([place]).  PAST SURGICAL HISTORY: He  has past surgical history that includes Esophagus surgery (2002); Incisional hernia repair (2004); Tonsillectomy (as child); Umbilical hernia repair (N/A, 06/14/2014); Tonsillectomy; and Radiology with anesthesia (N/A, 02/09/2015). H/o of TIPS for refractory ascites.   Allergies  Allergen Reactions  . Amoxicillin Diarrhea    No current facility-administered medications on file prior to encounter.   Current Outpatient Prescriptions on File Prior to Encounter  Medication Sig  . allopurinol (ZYLOPRIM) 300 MG tablet Take 300 mg by mouth daily.   Marland Kitchen atenolol (TENORMIN) 50 MG tablet Take 50 mg by mouth every morning.  Marland Kitchen ibuprofen (ADVIL,MOTRIN) 200 MG tablet Take 400 mg by mouth every 6 (six) hours as needed for headache or moderate pain.  Marland Kitchen omeprazole (PRILOSEC) 20 MG capsule Take 20 mg by mouth daily.     FAMILY HISTORY:  His has no family status information on file.   No information available as patient is intubated. No family around.  SOCIAL HISTORY: He  reports that he quit smoking about 11 years ago. His smoking use included Cigarettes.  He has a 20 pack-year smoking history. He has never used smokeless tobacco. He reports that he drinks about 4.2 oz of alcohol per week. He reports that he does not use illicit drugs. last alcohol  drink was 5 days ago  REVIEW OF SYSTEMS:   Unable to obtain as patient is encephalopathic. Per nursing staff, progressive decline in mental status throughout  the day. He was also bleeding per RN throughout the day.  SUBJECTIVE:  Intubated. He was somnolent prior to intubation.  VITAL SIGNS: BP 103/67 mmHg  Pulse 57  Temp(Src) 97 F (36.1 C) (Oral)  Resp 16  Ht 6' (1.829 m)  Wt 208 lb 12.4 oz (94.7 kg)  BMI 26.79 kg/m2  SpO2 100%  HEMODYNAMICS:    VENTILATOR SETTINGS: Vent Mode:  [-] PRVC FiO2 (%):  [70 %-100 %] 70 % Set Rate:  [16 bmp] 16 bmp Vt Set:  [id number]:[id number] mL] 620 mL PEEP:  [5 cmH20] 5 cmH20 Plateau Pressure:  [18 cmH20] 18 cmH20  INTAKE / OUTPUT: I/O last 3 completed shifts: In: 2658.3 [I.V.:2658.3] Out: 800 [Urine:800]  PHYSICAL EXAMINATION: General:  Sedated, comfortable, not in distress. ET tube in place. No active bleeding seen per orem.  Neuro:  Cranial nerves grossly intact. No facial asymmetry. No lateralizing signs elicited. HEENT:  PERLA, (-) NVD. Bloody tongue prior to intubation -- blood suctioned out -- no further bleeding noted. Airway class 4. Difficult intubation.  Cardiovascular:  Good s1/s2. (-) s3/m/r/g.  Lungs:  Fair ae. Rhonchi in both lung fields. Better with suctioning. No wheezing. Abdomen:  Dec BS. Soft. Obese. (-) masses/tenderness.  Musculoskeletal:  Gr 1 edema. (-) clubbing/cyanosis Skin:  Warm and dry. (-) rash.   LABS:  BMET  Recent Labs Lab 06/30/15 1227 07/04/15 1134  NA 127* 131*  K 4.3 3.8  CL 88* 93*  CO2 26 26  BUN 20 19  CREATININE 1.77* 1.93*  GLUCOSE 95 93    Electrolytes  Recent Labs Lab 06/30/15 1227 07/04/15 1134  CALCIUM 9.0 9.6    CBC  Recent Labs Lab 06/30/15 1227 07/04/15 1134  WBC 6.9 4.9  HGB 11.6* 11.4*  HCT 32.6* 32.3*  PLT 116* 112*    Coag's  Recent Labs Lab 06/30/15 1227  INR 1.13    Sepsis Markers No results for input(s): LATICACIDVEN, PROCALCITON, O2SATVEN in the last 168 hours.  ABG  Recent Labs Lab 07/05/15 1859  PHART 7.218*  PCO2ART 65.8*  PO2ART 57.3*    Liver Enzymes  Recent Labs Lab 06/30/15 1227  07/04/15 1134  AST 369* 256*  ALT 66* 66*  ALKPHOS 136* 121  BILITOT 1.3* 1.5*  ALBUMIN 3.9 3.7    Cardiac Enzymes No results for input(s): TROPONINI, PROBNP in the last 168 hours.  Glucose  Recent Labs Lab 07/04/15 1150 07/05/15 1856  GLUCAP 86 103*    Imaging Dg Chest Port 1 View  07/05/2015  CLINICAL DATA:  Respiratory distress EXAM: PORTABLE CHEST 1 VIEW COMPARISON:  07/04/2015 FINDINGS: Interval placement of an endotracheal tube with tip measuring 2.5 cm above the carina. Shallow inspiration with atelectasis in the lung bases. Cardiac enlargement with mild vascular congestion. No edema or consolidation. No blunting of costophrenic angles. No pneumothorax. IMPRESSION: Endotracheal tube tip measuring 2.5 cm above the carina. Cardiac enlargement with mild vascular congestion. No edema or consolidation. Electronically Signed   By: Lucienne Capers M.D.   On: 07/05/2015 20:16     STUDIES:  Cranial CT scan (March 20): Atrophy. No acute changes.  MRI cranial (March 20): No acute bleed. Intense signal and the globus pallidus maybe related to hepatic encephalopathy.   CULTURES: Blood culture (3/21) >> Trache culture (3/21) >> MRSA (3/21) >>  ANTIBIOTICS: Rocephin (3/21) >>   SIGNIFICANT EVENTS: 3/20 pt admitted for AMS. Treated as hepatic encephalopathy. 3/21 pt became somnolent/obtunded. PCCM consulted for hypercapneic resp failure.   LINES/TUBES:   DISCUSSION: 39 male, known to have liver cirrhosis related to alcohol abuse, history of TIPS for refractory ascites, comes in with 5 day history of altered mental status. Initially treated as hepatic encephalopathy. Sensorium worsened, becoming somnolent. Patient with hypercapnic respiratory failure. Transferred to ICU. Intubated.  ASSESSMENT / PLAN:  PULMONARY A: Acute hypoxemic hypercapnic respiratory failure secondary to unable to protect airway, likely with severe sleep apnea untreated, possible aspiration pneumonitis.  Patient also has been biting on his down all day with bleeding per mouth. Patient also is a difficult airway. P:   Keep patient intubated. Anticipate he won't be ready for weaning until after couple days. His FiO2 is 50%, sats are acceptable. Need to get ABG. Adjust vent accordingly. We'll give him Rocephin for possible aspiration pneumonitis, we'll potentially also cover possible SBP. Antibiotics are limited because of hepatic and liver disease. We'll keep mouth guard in for now. If he has more bleeding per mouth, he will need ENT evaluation prior to extubation. He had a lot of blood prior to intubation which was suctioned out. No further bleeding noted after intubation. They tried to put in an OG tube which caused some bleeding. We aborted OG tube insertion. Pulmicort twice a day and DuoNeb 4 times a day.  CARDIOVASCULAR A:  History of hypertension. Likely with diastolic dysfunction. P:  Blood pressure dropped with intubation, responding to IV fluids. We'll bolus 1 L and keep on maintenance fluids. Check 2-D echo.  RENAL A:   Acute kidney injury/chronic kidney disease. P:   Continue with IV bolus. Keep on maintenance fluids after. Observe creatinine. Correct electrolytes.   GASTROINTESTINAL A:   Hepatic encephalopathy. Transaminitis. 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 Possible SBP.  P:   GI following patient. Repeat ammonia. Keep 3-5 bowel movements per day. He is on lactulose enema. Is also on MiraLAX.  HEMATOLOGIC A:   Bleeding per mouth likely related to patient biting on his down all day. Thrombocytopenia, elevated INR, related to liver cirrhosis, CKD.  History of Esophageal cancer for which he had surgery.  P:  Observe bleeding per mouth. Check CBC, coags. May need transfusion with blood or fresh frozen plasma depending on results.  INFECTIOUS A:   Concern for aspiration  pneumonitis, possible SBP. P:   Panculture. Ceftriaxone will cover for both. Antibiotics are limited because of liver and renal disease. We'll de-escalate once with cultures. Check lactic acid. Check pro calcitonin.  ENDOCRINE A:   No active issues. P:   Check fingerstick every 4-6 hours. Keep CBG less than 180 mg percent  NEUROLOGIC A:   Altered mental status likely related to hepatic encephalopathy. There was concern about CVA but MRI was unremarkable for any acute changes. Encephalopathy related to alcohol withdrawal, hypercapnea.  P:   RASS goal: -1 On propofol drip. Initially propofol dropped his blood pressure. Responded to IVF. We'll try to give him Versed boluses when necessary. Fentanyl for pain.   FAMILY  - Updates: Family medicine team was able to update wife. She knows pt is intubated and is  in ICU.   - Inter-disciplinary family meet or Palliative Care meeting due by:  3/28  I spent at least 60 minutes of critical care time with this patient today.  Monica Becton, MD Pulmonary and [place] Pager: [phone] After 3 pm or if no response, call [phone]  07/05/2015, 8:26 PM

## 2015-07-05 NOTE — Progress Notes (Signed)
Pharmacy Consult Note - Ceftriaxone  2 YOM with declining mental status and h/o of alcohol abuse. Pharmacy consulted to start IV ceftriaxone for CAP and r/o SBP. Start ceftriaxone 2 gm IV Q 24 hours. Pharmacy to sign off since no further renal dose adjustments necessary.  Albertina Parr, PharmD., BCPS Clinical Pharmacist Pager [phone]

## 2015-07-06 ENCOUNTER — Ambulatory Visit (HOSPITAL_COMMUNITY): Payer: Medicare HMO

## 2015-07-06 ENCOUNTER — Inpatient Hospital Stay (HOSPITAL_COMMUNITY): Payer: Medicare HMO

## 2015-07-06 DIAGNOSIS — R140 Abdominal distension (gaseous): Secondary | ICD-10-CM

## 2015-07-06 DIAGNOSIS — R0600 Dyspnea, unspecified: Secondary | ICD-10-CM

## 2015-07-06 DIAGNOSIS — F1010 Alcohol abuse, uncomplicated: Secondary | ICD-10-CM

## 2015-07-06 LAB — COMPREHENSIVE METABOLIC PANEL
ALK PHOS: 92 U/L (ref 38–126)
ALT: 52 U/L (ref 17–63)
ANION GAP: 7 (ref 5–15)
AST: 180 U/L — ABNORMAL HIGH (ref 15–41)
Albumin: 3 g/dL — ABNORMAL LOW (ref 3.5–5.0)
BUN: 13 mg/dL (ref 6–20)
CALCIUM: 7.6 mg/dL — AB (ref 8.9–10.3)
CO2: 22 mmol/L (ref 22–32)
CREATININE: 1.53 mg/dL — AB (ref 0.61–1.24)
Chloride: 101 mmol/L (ref 101–111)
GFR calc Af Amer: 51 mL/min — ABNORMAL LOW (ref >60)
GFR calc non Af Amer: 44 mL/min — ABNORMAL LOW (ref >60)
GLUCOSE: 88 mg/dL (ref 65–99)
Potassium: 3.5 mmol/L (ref 3.5–5.1)
Sodium: 130 mmol/L — ABNORMAL LOW (ref 135–145)
Total Bilirubin: 1.5 mg/dL — ABNORMAL HIGH (ref 0.3–1.2)
Total Protein: 5.7 g/dL — ABNORMAL LOW (ref 6.5–8.1)

## 2015-07-06 LAB — ECHOCARDIOGRAM COMPLETE
Height: 74 in
WEIGHTICAEL: 3622.6 [oz_av]

## 2015-07-06 LAB — CBC
HEMATOCRIT: 28 % — AB (ref 39.0–52.0)
HEMOGLOBIN: 9.6 g/dL — AB (ref 13.0–17.0)
MCH: 33.4 pg (ref 26.0–34.0)
MCHC: 34.3 g/dL (ref 30.0–36.0)
MCV: 97.6 fL (ref 78.0–100.0)
Platelets: 108 10*3/uL — ABNORMAL LOW (ref 150–400)
RBC: 2.87 MIL/uL — ABNORMAL LOW (ref 4.22–5.81)
RDW: 14.2 % (ref 11.5–15.5)
WBC: 5.4 10*3/uL (ref 4.0–10.5)

## 2015-07-06 LAB — LACTIC ACID, PLASMA: Lactic Acid, Venous: 2.4 mmol/L (ref 0.5–2.0)

## 2015-07-06 MED ORDER — SODIUM CHLORIDE 0.9 % IV BOLUS (SEPSIS)
1000.0000 mL | Freq: Once | INTRAVENOUS | Status: AC
  Administered 2015-07-06: 1000 mL via INTRAVENOUS

## 2015-07-06 MED ORDER — PERFLUTREN LIPID MICROSPHERE
1.0000 mL | INTRAVENOUS | Status: AC | PRN
  Administered 2015-07-06: 2 mL via INTRAVENOUS
  Filled 2015-07-06: qty 10

## 2015-07-06 MED ORDER — LACTULOSE ENEMA
300.0000 mL | Freq: Two times a day (BID) | ORAL | Status: DC
  Administered 2015-07-06 – 2015-07-08 (×4): 300 mL via RECTAL
  Filled 2015-07-06 (×4): qty 300

## 2015-07-06 MED ORDER — POTASSIUM CHLORIDE 20 MEQ/15ML (10%) PO SOLN
40.0000 meq | Freq: Once | ORAL | Status: DC
  Filled 2015-07-06: qty 30

## 2015-07-06 MED ORDER — MAGNESIUM SULFATE 2 GM/50ML IV SOLN
2.0000 g | Freq: Once | INTRAVENOUS | Status: AC
  Administered 2015-07-06: 2 g via INTRAVENOUS
  Filled 2015-07-06: qty 50

## 2015-07-06 MED ORDER — PERFLUTREN LIPID MICROSPHERE
INTRAVENOUS | Status: AC
  Filled 2015-07-06: qty 10

## 2015-07-06 MED ORDER — LACTULOSE 10 GM/15ML PO SOLN: 30.0000 g | Freq: Three times a day (TID) | ORAL | Status: DC

## 2015-07-06 MED ORDER — PHENYLEPHRINE HCL 10 MG/ML IJ SOLN
0.0000 ug/min | INTRAVENOUS | Status: DC
  Administered 2015-07-06 (×2): 45 ug/min via INTRAVENOUS
  Administered 2015-07-06: 50 ug/min via INTRAVENOUS
  Administered 2015-07-06: 25 ug/min via INTRAVENOUS
  Filled 2015-07-06 (×6): qty 1

## 2015-07-06 NOTE — Progress Notes (Signed)
SLP Cancellation Note  Patient Details Name: [name] MRN: [id number] DOB: [birth year]   Cancelled treatment:        Pt placed on vent yesterday, therefore unable to participate in dysphagia tx. Will continue to follow.   Titus Mould 07/06/2015, 8:03 AM  Titus Mould, Student-SLP

## 2015-07-06 NOTE — Progress Notes (Signed)
[facility] Progress Note Patient Name: [name] DOB: 1945/12/05 MRN: [id number]   Date of Service  07/06/2015  HPI/Events of Note  Hypotension - BP = 73/56. Has had 2 liters of crystalloid already. No central venous catheter.   eICU Interventions  Will order: 1. 0.9 NaCl 1 liter IV over 1 hour now.  2. Phenylephrine IV infusion. Titrate to MAP >= 65.      Intervention Category Major Interventions: Hypotension - evaluation and management  [name],[name] 07/06/2015, 1:50 AM

## 2015-07-06 NOTE — Progress Notes (Signed)
Echocardiogram 2D Echocardiogram with Definity has been performed.  Tresa Res 07/06/2015, 1:09 PM

## 2015-07-06 NOTE — Progress Notes (Signed)
[facility] Progress Note Patient Name: [name] DOB: 1945/06/11 MRN: [id number]   Date of Service  07/06/2015  HPI/Events of Note  Nurse notified unable to place NG tube.Hepatic encephalopathy.  eICU Interventions  1. dC lactulose & MiraLAX via tube 2. Lactulose enema twice a day     Intervention Category Major Interventions: Delirium, psychosis, severe agitation - evaluation and management  Tera Partridge 07/06/2015, 9:43 PM

## 2015-07-06 NOTE — Progress Notes (Signed)
Subjective: Respiratory failure last night-->intubated-->transferred to ICU.  Objective: Vital signs in last 24 hours: Temp:  [94.2 F (34.6 C)-97.6 F (36.4 C)] 97.6 F (36.4 C) (03/22 0600) Pulse Rate:  [53-91] 62 (03/22 0700) Resp:  [0-22] 16 (03/22 0700) BP: (69-141)/(50-101) 109/67 mmHg (03/22 0700) SpO2:  [92 %-100 %] 100 % (03/22 0700) FiO2 (%):  [40 %-100 %] 40 % (03/22 0400) Weight:  [102.7 kg (226 lb 6.6 oz)] 102.7 kg (226 lb 6.6 oz) (03/22 0428) Weight change: -2.988 kg (-6 lb 9.4 oz) Last BM Date: 07/06/15  PE: GEN:  Intubated, sedated  ABD:  Soft, protuberant  Lab Results: CBC    Component Value Date/Time   WBC 5.4 07/06/2015 0250   WBC 3.2* 08/03/2005 0903   RBC 2.87* 07/06/2015 0250   RBC 3.49* 08/03/2005 0903   HGB 9.6* 07/06/2015 0250   HGB 11.9* 08/03/2005 0903   HCT 28.0* 07/06/2015 0250   HCT 34.2* 08/03/2005 0903   PLT 108* 07/06/2015 0250   PLT 208 08/03/2005 0903   MCV 97.6 07/06/2015 0250   MCV 98.1* 08/03/2005 0903   MCH 33.4 07/06/2015 0250   MCH 34.1* 08/03/2005 0903   MCHC 34.3 07/06/2015 0250   MCHC 34.8 08/03/2005 0903   RDW 14.2 07/06/2015 0250   RDW 13.4 08/03/2005 0903   LYMPHSABS 0.4* 08/03/2005 0903   MONOABS 0.4 08/03/2005 0903   EOSABS 0.1 08/03/2005 0903   BASOSABS 0.0 08/03/2005 0903   CMP     Component Value Date/Time   NA 130* 07/06/2015 0250   K 3.5 07/06/2015 0250   CL 101 07/06/2015 0250   CO2 22 07/06/2015 0250   GLUCOSE 88 07/06/2015 0250   BUN 13 07/06/2015 0250   CREATININE 1.53* 07/06/2015 0250   CREATININE 1.77* 06/30/2015 1227   CALCIUM 7.6* 07/06/2015 0250   PROT 5.7* 07/06/2015 0250   ALBUMIN 3.0* 07/06/2015 0250   AST 180* 07/06/2015 0250   ALT 52 07/06/2015 0250   ALKPHOS 92 07/06/2015 0250   BILITOT 1.5* 07/06/2015 0250   GFRNONAA 44* 07/06/2015 0250   GFRNONAA 38* 06/30/2015 1227   GFRAA 51* 07/06/2015 0250   GFRAA 43* 06/30/2015 1227   Assessment:  1.  Respiratory failure.  Suspect  component of sleep apnea.  Unclear how much his liver disease is playing a role, as patient was completely lucid yesterday morning. 2.  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 3.  Refractory ascites with TIPs. 4.  Confusion, unclear etiology, possible component of hepatic encephalopathy.  Plan:  1.  Supportive ventilatory management. 2.  Might ultimately consider lactulose per nasogastric tube or enema, if patient's mental status remains cloudy after weaning ventilator-associated sedation. 3.  Will revisit in a couple days, call us back if needed sooner.   [name] 07/06/2015, 8:36 AM   Pager [phone] If no answer or after 5 PM call [phone]

## 2015-07-06 NOTE — Progress Notes (Signed)
NG insertion attempted in left nare, pt began having blood out of mouth and nose. Unable to place NG, kept coiling in mouth. Etta Quill

## 2015-07-06 NOTE — Progress Notes (Signed)
PULMONARY / CRITICAL CARE MEDICINE   Name: [name] MRN: [id number] DOB: [birth year]    ADMISSION DATE:  07/04/2015 CONSULTATION DATE:  07/05/2015  REFERRING MD:  Family Medicine  CHIEF COMPLAINT:  AMS   HISTORY OF PRESENT ILLNESS:   Patient is encephalopathic so no history was obtained from him. Chart review was done as well as discussing the case with the primary team taking care of him.  [name] is a 70 y.o. male presenting with increased declining mental status for the past 4-5 days with slurring of speech, loss of balance, and increased sleeping, as well as auditory hallucinations and agitation. He last saw his gastroenterologist Dr. Oletta Lamas Thursday, 06/30/15, and was told to increase his lactulose to 15 mg TID, up from once daily. He has not had a bowel movement since then, however, and symptoms of decreased alertness have not improved. His wife reports that he is still actively drinking -- last drink was Thursday.   Patient recently finished a course of ciprofloxacin for "infection in the blood".   In the ED, VSS. CT head showed mild/moderate atrophy but no acute changes. CXR showed bibasilar atelectasis. EtOH < 5, AST 256, ALT 66, Ammonia 96 (less than level of 138 four days ago), CBG 86, VBG 7.411, pCO2 42.1, pO2 29.0, bicarb 26.8. UA with small hgb and 15 ketones, no nitrites or leukocytes. SCr 1.93 (baseline ~1.2).  GI service was consulted. GI was concerned about hepatic encephalopathy. Patient was also constipated.  On 3/21 -- patient's mental status progressively declined. He became somnolent. He ended up getting 2 mg of Ativan which made him more somnolent. Per nursing staff, he was also biting on his tongue all day and had a bloody mouth all day. Patient received Romazicon with no effect. PCCM consulted for respiratory failure. Repeat ABG 7.2, PCO2 66, PO2 57 on 2 L.  Patient transferred to ICU and was intubated. On glidoscope use, he had a lot of blood per orem  which was suctioned out. He did NOT bleed more with intubation.  They tried placing OGT which caused more bleeding -- we immediately stopped OGT insertion. OGT was only per orem.   SUBJECTIVE:  No events overnight.  VITAL SIGNS: BP 101/63 mmHg  Pulse 69  Temp(Src) 98.4 F (36.9 C) (Axillary)  Resp 16  Ht 6\' 2"  (1.88 m)  Wt 102.7 kg (226 lb 6.6 oz)  BMI 29.06 kg/m2  SpO2 100%  HEMODYNAMICS:    VENTILATOR SETTINGS: Vent Mode:  [-] PRVC FiO2 (%):  [30 %-100 %] 30 % Set Rate:  [16 bmp] 16 bmp Vt Set:  [620 mL] 620 mL PEEP:  [5 cmH20] 5 cmH20 Plateau Pressure:  [16 cmH20-23 cmH20] 16 cmH20  INTAKE / OUTPUT: I/O last 3 completed shifts: In: 7078 [I.V.:5878; IV Piggyback:1200] Out: 1400 [Urine:1400]  PHYSICAL EXAMINATION: General:  Sedated, comfortable, not in distress. ET tube in place. No active bleeding seen per orem.  Neuro:  Cranial nerves grossly intact. No facial asymmetry. No lateralizing signs elicited. HEENT:  PERLA, (-) NVD. Bloody tongue prior to intubation -- blood suctioned out -- no further bleeding noted. Airway class 4. Difficult intubation.  Cardiovascular:  Good s1/s2. (-) s3/m/r/g.  Lungs:  Fair ae. Rhonchi in both lung fields. Better with suctioning. No wheezing. Abdomen:  Dec BS. Soft. Obese. (-) masses/tenderness.  Musculoskeletal:  Gr 1 edema. (-) clubbing/cyanosis Skin:  Warm and dry. (-) rash.   LABS:  BMET  Recent Labs Lab 07/04/15 1134 07/05/15 2036 07/06/15 0250  NA 131* 131* 130*  K 3.8 3.9 3.5  CL 93* 97* 101  CO2 26 25 22   BUN 19 18 13   CREATININE 1.93* 1.62* 1.53*  GLUCOSE 93 114* 88   Electrolytes  Recent Labs Lab 07/04/15 1134 07/05/15 2036 07/06/15 0250  CALCIUM 9.6 8.3* 7.6*  MG  --  1.3*  --   PHOS  --  3.5  --    CBC  Recent Labs Lab 07/04/15 1134 07/05/15 2036 07/06/15 0250  WBC 4.9 3.3* 5.4  HGB 11.4* 10.3* 9.6*  HCT 32.3* 30.1* 28.0*  PLT 112* 110* 108*   Coag's  Recent Labs Lab 06/30/15 1227  07/05/15 2036  APTT  --  40*  INR 1.13 1.10   Sepsis Markers  Recent Labs Lab 07/05/15 2036 07/05/15 2308  LATICACIDVEN 1.9 2.4*  PROCALCITON <0.10  --    ABG  Recent Labs Lab 07/05/15 1859 07/05/15 2137  PHART 7.218* 7.321*  PCO2ART 65.8* 47.8*  PO2ART 57.3* 223.0*   Liver Enzymes  Recent Labs Lab 06/30/15 1227 07/04/15 1134 07/06/15 0250  AST 369* 256* 180*  ALT 66* 66* 52  ALKPHOS 136* 121 92  BILITOT 1.3* 1.5* 1.5*  ALBUMIN 3.9 3.7 3.0*   Cardiac Enzymes No results for input(s): TROPONINI, PROBNP in the last 168 hours.  Glucose  Recent Labs Lab 07/04/15 1150 07/05/15 1856  GLUCAP 86 103*   Imaging Portable Chest Xray  07/06/2015  CLINICAL DATA:  Respiratory failure encephalopathy, hepatic cirrhosis EXAM: PORTABLE CHEST 1 VIEW COMPARISON:  Portable chest x-ray of July 07, 2015. FINDINGS: The patient the patient's head overlies the upper chest due to his clinical condition. The lungs are adequately inflated. There are increased interstitial densities in the lower lung zones bilaterally. There is persistent obscuration of the left hemidiaphragm. The endotracheal tube tip is only faintly visible but lies approximately 2.7 cm above the carina.The cardiac silhouette is mildly enlarged. The pulmonary vascularity is not engorged. External pacemaker -defibrillator pads are present on the left. IMPRESSION: Slight interval increase in bibasilar atelectasis or pneumonia. The study is limited due to patient positioning. Electronically Signed   By: David  Martinique M.D.   On: 07/06/2015 07:16   Dg Chest Port 1 View  07/05/2015  CLINICAL DATA:  Respiratory distress EXAM: PORTABLE CHEST 1 VIEW COMPARISON:  07/04/2015 FINDINGS: Interval placement of an endotracheal tube with tip measuring 2.5 cm above the carina. Shallow inspiration with atelectasis in the lung bases. Cardiac enlargement with mild vascular congestion. No edema or consolidation. No blunting of costophrenic  angles. No pneumothorax. IMPRESSION: Endotracheal tube tip measuring 2.5 cm above the carina. Cardiac enlargement with mild vascular congestion. No edema or consolidation. Electronically Signed   By: Lucienne Capers M.D.   On: 07/05/2015 20:16   STUDIES:  Cranial CT scan (March 20): Atrophy. No acute changes. MRI cranial (March 20): No acute bleed. Intense signal and the globus pallidus maybe related to hepatic encephalopathy.   CULTURES: Blood culture (3/21) >> Trache culture (3/21) >> MRSA (3/21) >>  ANTIBIOTICS: Rocephin (3/21) >>   SIGNIFICANT EVENTS: 3/20 pt admitted for AMS. Treated as hepatic encephalopathy. 3/21 pt became somnolent/obtunded. PCCM consulted for hypercapneic resp failure.   LINES/TUBES: ETT 3/21>>>  DISCUSSION: 85 male, known to have liver cirrhosis related to alcohol abuse, history of TIPS for refractory ascites, comes in with 5 day history of altered mental status. Initially treated as hepatic encephalopathy. Sensorium worsened, becoming somnolent. Patient with hypercapnic respiratory failure. Transferred to ICU. Intubated.  ASSESSMENT /  PLAN:  PULMONARY A: Acute hypoxemic hypercapnic respiratory failure secondary to unable to protect airway, likely with severe sleep apnea untreated, possible aspiration pneumonitis. Patient also has been biting on his down all day with bleeding per mouth. Patient also is a difficult airway. P:   Full vent support until mental status improves. Titrate O2 for sat of 88-92%. Adjust vent for ABG. Rocephin for aspiration pneumonitis. We'll keep mouth guard in for now. If he has more bleeding per mouth, he will need ENT evaluation prior to extubation. He had a lot of blood prior to intubation which was suctioned out. No further bleeding noted after intubation. They tried to put in an OG tube which caused some bleeding. Pulmicort twice a day and DuoNeb 4 times a day.  CARDIOVASCULAR A:  History of hypertension. Likely with  diastolic dysfunction. P:  Tele monitoring Check 2-D echo pending.  RENAL A:   Acute kidney injury/chronic kidney disease. Hypo K and Mg. P:   KVO IVF once TF are at goal. BMET in AM. Replace electrolytes as indicated.  GASTROINTESTINAL A:   Hepatic encephalopathy. Transaminitis. 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 Possible SBP.  P:   GI following patient. Place NGT. Begin lactulose via tube.  HEMATOLOGIC A:   Bleeding per mouth likely related to patient biting on his down all day. Thrombocytopenia, elevated INR, related to liver cirrhosis, CKD.  History of Esophageal cancer for which he had surgery.  P:  Observe bleeding per mouth. Check CBC, coags.  INFECTIOUS A:   Concern for aspiration pneumonitis, possible SBP. P:   Panculture. Ceftriaxone will cover for both. Antibiotics are limited because of liver and renal disease. We'll de-escalate once with cultures. Check lactic acid. Procalcitonin <0.1.  ENDOCRINE A:   No active issues. P:   Check fingerstick every 4-6 hours. Keep CBG less than 180 mg percent  NEUROLOGIC A:   Altered mental status likely related to hepatic encephalopathy. There was concern about CVA but MRI was unremarkable for any acute changes. Encephalopathy related to alcohol withdrawal, hypercapnea.  P:   RASS goal: -1 Fentanyl drip. PRN versed.  FAMILY  - Updates: Wife and son updated bedside.  Encouraged to discuss code status.  - Inter-disciplinary family meet or Palliative Care meeting due by:  3/28  The patient is critically ill with multiple organ systems failure and requires high complexity decision making for assessment and support, frequent evaluation and titration of therapies, application of advanced monitoring technologies and extensive interpretation of multiple databases.   Critical Care Time devoted to patient care services described in  this note is  35  Minutes. This time reflects time of care of this signee Dr Jennet Maduro. This critical care time does not reflect procedure time, or teaching time or supervisory time of PA/NP/Med student/Med Resident etc but could involve care discussion time.  Rush Farmer, M.D. [facility] Pulmonary/Critical Care Medicine. Pager: [phone]. After hours pager: [phone].  07/06/2015, 12:46 PM

## 2015-07-07 ENCOUNTER — Other Ambulatory Visit: Payer: Medicare Other

## 2015-07-07 ENCOUNTER — Inpatient Hospital Stay (HOSPITAL_COMMUNITY): Payer: Medicare HMO

## 2015-07-07 LAB — BASIC METABOLIC PANEL
ANION GAP: 11 (ref 5–15)
BUN: 14 mg/dL (ref 6–20)
CALCIUM: 7.5 mg/dL — AB (ref 8.9–10.3)
CHLORIDE: 103 mmol/L (ref 101–111)
CO2: 21 mmol/L — AB (ref 22–32)
Creatinine, Ser: 1.66 mg/dL — ABNORMAL HIGH (ref 0.61–1.24)
GFR calc non Af Amer: 40 mL/min — ABNORMAL LOW (ref >60)
GFR, EST AFRICAN AMERICAN: 46 mL/min — AB (ref >60)
GLUCOSE: 80 mg/dL (ref 65–99)
Potassium: 3 mmol/L — ABNORMAL LOW (ref 3.5–5.1)
Sodium: 135 mmol/L (ref 135–145)

## 2015-07-07 LAB — CBC
HEMATOCRIT: 25.6 % — AB (ref 39.0–52.0)
HEMOGLOBIN: 8.8 g/dL — AB (ref 13.0–17.0)
MCH: 33.6 pg (ref 26.0–34.0)
MCHC: 34.4 g/dL (ref 30.0–36.0)
MCV: 97.7 fL (ref 78.0–100.0)
Platelets: 101 10*3/uL — ABNORMAL LOW (ref 150–400)
RBC: 2.62 MIL/uL — ABNORMAL LOW (ref 4.22–5.81)
RDW: 14.9 % (ref 11.5–15.5)
WBC: 5 10*3/uL (ref 4.0–10.5)

## 2015-07-07 LAB — BLOOD GAS, ARTERIAL
ACID-BASE DEFICIT: 3.6 mmol/L — AB (ref 0.0–2.0)
Bicarbonate: 19.6 mEq/L — ABNORMAL LOW (ref 20.0–24.0)
DRAWN BY: 44956
FIO2: 0.3
LHR: 16 {breaths}/min
O2 SAT: 98.1 %
PEEP/CPAP: 5 cmH2O
PH ART: 7.454 — AB (ref 7.350–7.450)
Patient temperature: 98.6
TCO2: 20.5 mmol/L (ref 0–100)
VT: 620 mL
pCO2 arterial: 28.4 mmHg — ABNORMAL LOW (ref 35.0–45.0)
pO2, Arterial: 113 mmHg — ABNORMAL HIGH (ref 80.0–100.0)

## 2015-07-07 LAB — GC/CHLAMYDIA PROBE AMP (~~LOC~~) NOT AT ARMC
CHLAMYDIA, DNA PROBE: NEGATIVE
NEISSERIA GONORRHEA: NEGATIVE

## 2015-07-07 LAB — PHOSPHORUS: PHOSPHORUS: 2.1 mg/dL — AB (ref 2.5–4.6)

## 2015-07-07 LAB — MAGNESIUM: Magnesium: 1.6 mg/dL — ABNORMAL LOW (ref 1.7–2.4)

## 2015-07-07 MED ORDER — POTASSIUM PHOSPHATES 15 MMOLE/5ML IV SOLN
20.0000 meq | Freq: Once | INTRAVENOUS | Status: AC
  Administered 2015-07-07: 20 meq via INTRAVENOUS
  Filled 2015-07-07: qty 4.55

## 2015-07-07 MED ORDER — POTASSIUM CHLORIDE 10 MEQ/100ML IV SOLN
INTRAVENOUS | Status: AC
  Filled 2015-07-07: qty 100

## 2015-07-07 MED ORDER — MAGNESIUM SULFATE 2 GM/50ML IV SOLN
INTRAVENOUS | Status: AC
  Filled 2015-07-07: qty 50

## 2015-07-07 MED ORDER — POTASSIUM CHLORIDE 10 MEQ/100ML IV SOLN
10.0000 meq | INTRAVENOUS | Status: AC
  Administered 2015-07-07 (×6): 10 meq via INTRAVENOUS
  Filled 2015-07-07 (×5): qty 100

## 2015-07-07 MED ORDER — MAGNESIUM SULFATE 2 GM/50ML IV SOLN
2.0000 g | Freq: Once | INTRAVENOUS | Status: AC
  Administered 2015-07-07: 2 g via INTRAVENOUS

## 2015-07-07 MED ORDER — FOLIC ACID 5 MG/ML IJ SOLN
1.0000 mg | Freq: Every day | INTRAMUSCULAR | Status: DC
  Administered 2015-07-07 – 2015-07-12 (×6): 1 mg via INTRAVENOUS
  Filled 2015-07-07: qty 0
  Filled 2015-07-07 (×3): qty 0.2
  Filled 2015-07-07: qty 0
  Filled 2015-07-07: qty 0.2
  Filled 2015-07-07 (×3): qty 0
  Filled 2015-07-07 (×2): qty 0.2

## 2015-07-07 NOTE — Progress Notes (Signed)
Initial Nutrition Assessment  DOCUMENTATION CODES:   Not applicable  INTERVENTION:   - If unable to extubate and able to get an NG tube placed, recommend initiating enteral nutrition with Vital AF 1.2 at 20 ml/hr to increase by 10 ml every 4 hours to a goal rate of 60 ml/hr.  Provide 30 ml of Prostat QID. - If enteral nutrition initiated, recommend monitoring magnesium, potassium, and phosphorus daily for at least 3 days, MD to replete as needed, as patient is at risk for refeeding syndrome given poor po intake over the past week and low electrolyte lab values.  Tube feeding regimen would provide a total of 2128 kcals, 165 grams of protein, and 1168 ml of water.  NUTRITION DIAGNOSIS:   Inadequate oral intake related to inability to eat as evidenced by NPO status.  GOAL:   Patient will meet greater than or equal to 90% of their needs  MONITOR:   Vent status, Labs, Weight trends, Skin, I & O's  REASON FOR ASSESSMENT:   Ventilator    ASSESSMENT:   70 y/o male with PMH alcoholic liver cirrhosis s/p TIPS procedure 01/2015 for refractory ascites, Esophogeal Ca s/p total esophagectomy (2002), HTN, and Gout.  Presents with worsening encephalopathy/AMS. Wife noted increased hallucinations, altered mental status, and unsteadiness while ambulating over the past few days.  Reports decreased appetite but says he has been drinking a lot of water over the last few days.  Patients last alcoholic drink was 0000000.     Patient is currently intubated on ventilator support.   MV: 10 ml/min Temp (24hrs), Avg:98.4 F (36.9 C), Min:98.1 F (36.7 C), Max:98.7 F (37.1 C) Unable to place NG tube earlier today, RN states that they will attempt IR placement tomorrow  Nutrition Focused Physical Exam was completed.  Findings include no fat depletion, no muscle depletion, and no edema.    Medications reviewed: folic acid, MVI, IV potassium, IV phosphorus, Vitamin B-1. Labs reviewed: low potassium (3.0),  low phosphorus (2.1), low magnesium (1.6).   Diet Order:  Diet NPO time specified  Skin:  Wound (see comment) (Deep tissue injury)  Last BM:  3/22  Height:   Ht Readings from Last 1 Encounters:  07/05/15 6\' 2"  (1.88 m)    Weight:   Wt Readings from Last 1 Encounters:  07/07/15 230 lb 13.2 oz (104.7 kg)    Ideal Body Weight:  86.4 kg  BMI:  Body mass index is 29.62 kg/(m^2).  Estimated Nutritional Needs:   Kcal:  2108  Protein:  >/= 157 grams  Fluid:  >/= 2 L  EDUCATION NEEDS:   No education needs identified at this time  [name], Dietetic Intern Pager: [phone]

## 2015-07-07 NOTE — Progress Notes (Signed)
Referring Physician(s): [name]  Supervising Physician: [name]  Chief Complaint:  Hepatic encephalopathy  HPI:  [name] is a 70 y.o. Male known to our service.  He has a PMH alcoholic liver cirrhosis s/p TIPS by Dr. Laurence Benjamin 02/10/2015 , history of Esophogeal SCC s/p total esophagectomy, HTN, and Gout presents with worsening encephalopathy/AMS.  His wife has noted increased hallucinations, altered mental status, and unsteadiness while ambulating over the past few days.  He is on lactulose but no bowel movement x 4 days. Ammonia is lower than 4 days ago but symptoms are worse.   Patient's last drink was 4 days ago.   Despite lack of agitation, tremors or tachycardia, wife reports him having hallucinations  Wife initially had concern for stroke with onset of declining status 3 weeks ago.   CT head negative for acute processes. Afebrile and no leukocytosis.  All information obtained from chart as no family in room and patient intubated/sedated  Allergies: Amoxicillin  Medications: Prior to Admission medications   Medication Sig Start Date End Date Taking? Authorizing Provider  allopurinol (ZYLOPRIM) 300 MG tablet Take 300 mg by mouth daily.  12/20/12  Yes Historical Provider, MD  atenolol (TENORMIN) 50 MG tablet Take 50 mg by mouth every morning.   Yes Historical Provider, MD  ibuprofen (ADVIL,MOTRIN) 200 MG tablet Take 400 mg by mouth every 6 (six) hours as needed for headache or moderate pain.   Yes Historical Provider, MD  lactulose (CHRONULAC) 10 GM/15ML solution Take 15 mLs by mouth daily. 06/23/15  Yes Historical Provider, MD  omeprazole (PRILOSEC) 20 MG capsule Take 20 mg by mouth daily.  01/18/13  Yes Historical Provider, MD     Vital Signs: BP 109/64 mmHg  Pulse 60  Temp(Src) 98.2 F (36.8 C) (Oral)  Resp 16  Ht 6\' 2"  (1.88 m)  Wt 230 lb 13.2 oz (104.7 kg)  BMI 29.62 kg/m2  SpO2 100%  Physical Exam  Constitutional:  Obese  HENT:    Head: Atraumatic.  Cardiovascular: Normal rate, regular rhythm and normal heart sounds.   Pulmonary/Chest: He has no wheezes.  Intubated, ventilator support  Abdominal:  Round, could have recurrent ascites.  Neurological:  Sedated  Vitals reviewed.   Imaging: Dg Chest 2 View  07/04/2015  CLINICAL DATA:  Altered mental status EXAM: CHEST  2 VIEW COMPARISON:  11/10/2009 FINDINGS: Cardiac enlargement. Pulmonary vascular congestion without edema. Hypoventilation with bibasilar atelectasis. IMPRESSION: Cardiac enlargement with pulmonary vascular congestion. Negative for edema Bibasilar atelectasis Electronically Signed   By: [name] M.D.   On: 07/04/2015 12:51   Ct Head Wo Contrast  07/04/2015  CLINICAL DATA:  Altered mental status. EXAM: CT HEAD WITHOUT CONTRAST TECHNIQUE: Contiguous axial images were obtained from the base of the skull through the vertex without intravenous contrast. COMPARISON:  None. FINDINGS: Mild to moderate atrophy. Mild chronic microvascular ischemic change in the white matter. No acute infarct. Negative for hemorrhage or mass. No shift of the midline structures. Normal calvarium. IMPRESSION: Atrophy and chronic white matter changes.  No acute abnormality. Electronically Signed   By: [name] M.D.   On: 07/04/2015 13:00   Mr [name] Wo Contrast  07/04/2015  CLINICAL DATA:  70 year old hypertensive male with esophageal cancer and cirrhosis presenting with confusion. Subsequent encounter. EXAM: MRI HEAD WITHOUT CONTRAST MRA HEAD WITHOUT CONTRAST TECHNIQUE: Multiplanar, multiecho pulse sequences of the brain and surrounding structures were obtained without intravenous contrast. Angiographic images of the head were obtained using MRA  technique without contrast. COMPARISON:  07/04/2015 CT.  No comparison MR. FINDINGS: MRI HEAD FINDINGS Exam is motion degraded. No acute infarct or intracranial hemorrhage. T1 increased hyperintensity globus pallidus. This has been  described in patients with hepatic encephalopathy. Mild chronic small vessel disease changes. Global atrophy without hydrocephalus. No intracranial mass lesion noted on this unenhanced exam. Spinal stenosis C3-4 suspected although incompletely assessed secondary to motion. Degenerative changes C1-2. Cervical medullary junction within normal limits. Mild exophthalmos. Abnormal right vertebral artery.  Please see below MRA HEAD FINDINGS Exam is motion degraded. Right vertebral artery is occluded. Mild moderate narrowing proximal basilar artery. Nonvisualized posterior inferior cerebellar arteries and left anterior inferior cerebellar artery with only small portion of the right anterior inferior cerebellar artery visualized. Small superior cerebellar arteries bilaterally. Narrowing distal posterior cerebral artery branches bilaterally. Artifact pre cavernous segment internal carotid artery greater on left limits evaluation for detection of stenosis in this region. Mild irregularity cavernous segment internal carotid artery bilaterally. Poor delineation of a majority of the middle cerebral artery branches bilaterally which may be narrowed. No obvious aneurysm. IMPRESSION: MRI HEAD Exam is motion degraded. No acute infarct or intracranial hemorrhage. T1 increased hyperintensity globus pallidus. This has been described in patients with hepatic encephalopathy. Mild chronic small vessel disease changes. Global atrophy without hydrocephalus. No intracranial mass lesion noted on this unenhanced exam. Spinal stenosis C3-4 suspected although incompletely assessed secondary to motion. Degenerative changes C1-2. MRA HEAD Exam is motion degraded. Right vertebral artery is occluded. Mild to moderate narrowing proximal basilar artery. Nonvisualized posterior inferior cerebellar arteries and left anterior inferior cerebellar artery with only small portion of the right anterior inferior cerebellar artery visualized. Small superior  cerebellar arteries bilaterally. Narrowing distal posterior cerebral artery branches bilaterally. Artifact pre cavernous segment internal carotid artery greater on left limits evaluation for detection of stenosis in this region. Mild irregularity cavernous segment internal carotid artery bilaterally. Poor delineation of a majority of the middle cerebral artery branches bilaterally which may be narrowed. Electronically Signed   By: Genia Del M.D.   On: 07/04/2015 18:10   Mr Brain Wo Contrast  07/04/2015  CLINICAL DATA:  70 year old hypertensive male with esophageal cancer and cirrhosis presenting with confusion. Subsequent encounter. EXAM: MRI HEAD WITHOUT CONTRAST MRA HEAD WITHOUT CONTRAST TECHNIQUE: Multiplanar, multiecho pulse sequences of the brain and surrounding structures were obtained without intravenous contrast. Angiographic images of the head were obtained using MRA technique without contrast. COMPARISON:  07/04/2015 CT.  No comparison MR. FINDINGS: MRI HEAD FINDINGS Exam is motion degraded. No acute infarct or intracranial hemorrhage. T1 increased hyperintensity globus pallidus. This has been described in patients with hepatic encephalopathy. Mild chronic small vessel disease changes. Global atrophy without hydrocephalus. No intracranial mass lesion noted on this unenhanced exam. Spinal stenosis C3-4 suspected although incompletely assessed secondary to motion. Degenerative changes C1-2. Cervical medullary junction within normal limits. Mild exophthalmos. Abnormal right vertebral artery.  Please see below MRA HEAD FINDINGS Exam is motion degraded. Right vertebral artery is occluded. Mild moderate narrowing proximal basilar artery. Nonvisualized posterior inferior cerebellar arteries and left anterior inferior cerebellar artery with only small portion of the right anterior inferior cerebellar artery visualized. Small superior cerebellar arteries bilaterally. Narrowing distal posterior cerebral  artery branches bilaterally. Artifact pre cavernous segment internal carotid artery greater on left limits evaluation for detection of stenosis in this region. Mild irregularity cavernous segment internal carotid artery bilaterally. Poor delineation of a majority of the middle cerebral artery branches bilaterally which may be narrowed.  No obvious aneurysm. IMPRESSION: MRI HEAD Exam is motion degraded. No acute infarct or intracranial hemorrhage. T1 increased hyperintensity globus pallidus. This has been described in patients with hepatic encephalopathy. Mild chronic small vessel disease changes. Global atrophy without hydrocephalus. No intracranial mass lesion noted on this unenhanced exam. Spinal stenosis C3-4 suspected although incompletely assessed secondary to motion. Degenerative changes C1-2. MRA HEAD Exam is motion degraded. Right vertebral artery is occluded. Mild to moderate narrowing proximal basilar artery. Nonvisualized posterior inferior cerebellar arteries and left anterior inferior cerebellar artery with only small portion of the right anterior inferior cerebellar artery visualized. Small superior cerebellar arteries bilaterally. Narrowing distal posterior cerebral artery branches bilaterally. Artifact pre cavernous segment internal carotid artery greater on left limits evaluation for detection of stenosis in this region. Mild irregularity cavernous segment internal carotid artery bilaterally. Poor delineation of a majority of the middle cerebral artery branches bilaterally which may be narrowed. Electronically Signed   By: Genia Del M.D.   On: 07/04/2015 18:10   Dg Chest Port 1 View  07/07/2015  CLINICAL DATA:  Intubation . EXAM: PORTABLE CHEST 1 VIEW COMPARISON:  07/06/2015. FINDINGS: Endotracheal tube in stable position. Cardiomegaly with normal pulmonary vascularity. Low lung volumes with bibasilar atelectasis and/or infiltrates. No pneumothorax. IMPRESSION: 1. Endotracheal tube in stable  position. 2.  Stable cardiomegaly. 3. Low lung volumes with bibasilar atelectasis and/or infiltrates. Electronically Signed   By: Marcello Moores  Register   On: 07/07/2015 07:27   Portable Chest Xray  07/06/2015  CLINICAL DATA:  Respiratory failure encephalopathy, hepatic cirrhosis EXAM: PORTABLE CHEST 1 VIEW COMPARISON:  Portable chest x-ray of July 07, 2015. FINDINGS: The patient the patient's head overlies the upper chest due to his clinical condition. The lungs are adequately inflated. There are increased interstitial densities in the lower lung zones bilaterally. There is persistent obscuration of the left hemidiaphragm. The endotracheal tube tip is only faintly visible but lies approximately 2.7 cm above the carina.The cardiac silhouette is mildly enlarged. The pulmonary vascularity is not engorged. External pacemaker -defibrillator pads are present on the left. IMPRESSION: Slight interval increase in bibasilar atelectasis or pneumonia. The study is limited due to patient positioning. Electronically Signed   By: David  Martinique M.D.   On: 07/06/2015 07:16   Dg Chest Port 1 View  07/05/2015  CLINICAL DATA:  Respiratory distress EXAM: PORTABLE CHEST 1 VIEW COMPARISON:  07/04/2015 FINDINGS: Interval placement of an endotracheal tube with tip measuring 2.5 cm above the carina. Shallow inspiration with atelectasis in the lung bases. Cardiac enlargement with mild vascular congestion. No edema or consolidation. No blunting of costophrenic angles. No pneumothorax. IMPRESSION: Endotracheal tube tip measuring 2.5 cm above the carina. Cardiac enlargement with mild vascular congestion. No edema or consolidation. Electronically Signed   By: Lucienne Capers M.D.   On: 07/05/2015 20:16   Dg Abd 2 Views  07/04/2015  CLINICAL DATA:  No bowel movement for 4 days EXAM: ABDOMEN - 2 VIEW COMPARISON:  None. FINDINGS: Scattered large and small bowel gas is noted. Fecal material is noted throughout the colon. No free air is seen.  TIPS shunt is noted in the right upper quadrant. No acute bony abnormality is seen. Aortic calcifications are noted. IMPRESSION: No acute abnormality is noted.  Scattered fecal material is seen. Electronically Signed   By: Inez Catalina M.D.   On: 07/04/2015 18:31    Labs:  CBC:  Recent Labs  07/04/15 1134 07/05/15 2036 07/06/15 0250 07/07/15 0455  WBC 4.9 3.3* 5.4 5.0  HGB 11.4* 10.3* 9.6* 8.8*  HCT 32.3* 30.1* 28.0* 25.6*  PLT 112* 110* 108* 101*    COAGS:  Recent Labs  02/09/15 0927 02/10/15 0805 03/23/15 1133 06/30/15 1227 07/05/15 2036  INR 1.11 1.22 1.14 1.13 1.10  APTT 37  --   --   --  40*    BMP:  Recent Labs  07/04/15 1134 07/05/15 2036 07/06/15 0250 07/07/15 0455  NA 131* 131* 130* 135  K 3.8 3.9 3.5 3.0*  CL 93* 97* 101 103  CO2 26 25 22  21*  GLUCOSE 93 114* 88 80  BUN 19 18 13 14   CALCIUM 9.6 8.3* 7.6* 7.5*  CREATININE 1.93* 1.62* 1.53* 1.66*  GFRNONAA 33* 41* 44* 40*  GFRAA 38* 47* 51* 46*    LIVER FUNCTION TESTS:  Recent Labs  03/23/15 1133 06/30/15 1227 07/04/15 1134 07/06/15 0250  BILITOT 1.1 1.3* 1.5* 1.5*  AST 26 369* 256* 180*  ALT 9 66* 66* 52  ALKPHOS 148* 136* 121 92  PROT 6.7 6.8 6.8 5.7*  ALBUMIN 3.3* 3.9 3.7 3.0*    Assessment and Plan:  Hepatic encephalopathy  H/O TIPS by Dr. Laurence Benjamin   Agree with GI plan  Continue lactulose per rectum  Will obtain doppler of liver to evaluate patency of TIPS.  Will follow  Electronically Signed: Murrell Redden PA-C 07/07/2015, 4:42 PM   I spent a total of 15 Minutes at the the patient's bedside AND on the patient's hospital floor or unit, greater than 50% of which was counseling/coordinating care for hepatic encephalopathy, s/p TIPS.

## 2015-07-07 NOTE — Progress Notes (Signed)
Speech Language Pathology Discharge Patient Details Name: [name] MRN: [id number] DOB: August 03, 1945 Today's Date: 07/07/2015 Time:  -     Patient discharged from SLP services secondary to medical decline - will need to re-order SLP to resume therapy services.  Please see latest therapy progress note for current level of functioning and progress toward goals.    Progress and discharge plan discussed with patient and/or caregiver: Patient unable to participate in discharge planning and no caregivers available  GO     Titus Mould 07/07/2015, 7:45 AM   Titus Mould, Student-SLP

## 2015-07-07 NOTE — Progress Notes (Signed)
PULMONARY / CRITICAL CARE MEDICINE   Name: [name] MRN: [id number] DOB: 07-05-1945    ADMISSION DATE:  07/04/2015 CONSULTATION DATE:  07/05/2015  REFERRING MD:  Family Medicine  CHIEF COMPLAINT:  AMS    SUBJECTIVE:  Unable to pass NGT overnight, meds changed to PR.  RN reports BM with lactulose enema's.  SBT/WUA in progress.   VITAL SIGNS: BP 107/66 mmHg  Pulse 66  Temp(Src) 98.2 F (36.8 C) (Oral)  Resp 16  Ht 6\' 2"  (1.88 m)  Wt 230 lb 13.2 oz (104.7 kg)  BMI 29.62 kg/m2  SpO2 100%  HEMODYNAMICS:    VENTILATOR SETTINGS: Vent Mode:  [-] PRVC FiO2 (%):  [30 %] 30 % Set Rate:  [16 bmp] 16 bmp Vt Set:  [620 mL] 620 mL PEEP:  [5 cmH20] 5 cmH20 Plateau Pressure:  [14 cmH20-17 cmH20] 16 cmH20  INTAKE / OUTPUT: I/O last 3 completed shifts: In: 8622.8 [I.V.:7322.8; IV Piggyback:1300] Out: [id number] [Urine:1380]  PHYSICAL EXAMINATION: General:  Chronically ill appearing male on vent in NAD Neuro:  Sedate, WUA in progress, moves all ext's spontaneously, opens eyes to command HEENT:  PERLA, OETT, bloody oral secretions Cardiovascular:  s1s2 rrr, no m/r/g Lungs:  Even/non-labored on SBT, lungs bilaterally clear with occasional soft wheeze Abdomen:  Obese/soft, bsx4 active  Musculoskeletal:  No acute deformities, 1+ pedal edema Skin:  Warm and dry. (-) rash.   LABS:  BMET  Recent Labs Lab 07/05/15 2036 07/06/15 0250 07/07/15 0455  NA 131* 130* 135  K 3.9 3.5 3.0*  CL 97* 101 103  CO2 25 22 21*  BUN 18 13 14   CREATININE 1.62* 1.53* 1.66*  GLUCOSE 114* 88 80   Electrolytes  Recent Labs Lab 07/05/15 2036 07/06/15 0250 07/07/15 0455  CALCIUM 8.3* 7.6* 7.5*  MG 1.3*  --  1.6*  PHOS 3.5  --  2.1*   CBC  Recent Labs Lab 07/05/15 2036 07/06/15 0250 07/07/15 0455  WBC 3.3* 5.4 5.0  HGB 10.3* 9.6* 8.8*  HCT 30.1* 28.0* 25.6*  PLT 110* 108* 101*   Coag's  Recent Labs Lab 06/30/15 1227 07/05/15 2036  APTT  --  40*  INR 1.13 1.10   Sepsis  Markers  Recent Labs Lab 07/05/15 2036 07/05/15 2308  LATICACIDVEN 1.9 2.4*  PROCALCITON <0.10  --    ABG  Recent Labs Lab 07/05/15 1859 07/05/15 2137 07/07/15 0410  PHART 7.218* 7.321* 7.454*  PCO2ART 65.8* 47.8* 28.4*  PO2ART 57.3* 223.0* 113*   Liver Enzymes  Recent Labs Lab 06/30/15 1227 07/04/15 1134 07/06/15 0250  AST 369* 256* 180*  ALT 66* 66* 52  ALKPHOS 136* 121 92  BILITOT 1.3* 1.5* 1.5*  ALBUMIN 3.9 3.7 3.0*   Cardiac Enzymes No results for input(s): TROPONINI, PROBNP in the last 168 hours.  Glucose  Recent Labs Lab 07/04/15 1150 07/05/15 1856  GLUCAP 86 103*   Imaging Dg Chest Port 1 View  07/07/2015  CLINICAL DATA:  Intubation . EXAM: PORTABLE CHEST 1 VIEW COMPARISON:  07/06/2015. FINDINGS: Endotracheal tube in stable position. Cardiomegaly with normal pulmonary vascularity. Low lung volumes with bibasilar atelectasis and/or infiltrates. No pneumothorax. IMPRESSION: 1. Endotracheal tube in stable position. 2.  Stable cardiomegaly. 3. Low lung volumes with bibasilar atelectasis and/or infiltrates. Electronically Signed   By: Marcello Moores  Register   On: 07/07/2015 07:27   STUDIES:  CT Head 3/20 >>  Atrophy. No acute changes. MRI Head (3/20 >> No acute bleed. Intense signal and the globus pallidus maybe related  to hepatic encephalopathy.  ECHO 3/22 >> mild hypertrophy, EF 55-60%, normal wall motion, mild MR  CULTURES: Blood culture 3/21 >> Trache culture 3/21 >> MRSA 3/21 >>  ANTIBIOTICS: Rocephin (3/21) >>   SIGNIFICANT EVENTS: 3/20 pt admitted for AMS after abrupt cessation of ETOH. Treated as hepatic encephalopathy. 3/21 pt became somnolent/obtunded. PCCM consulted for hypercapneic resp failure.   LINES/TUBES: ETT 3/21 >>  DISCUSSION: 70 male, known to have liver cirrhosis related to alcohol abuse, history of TIPS for refractory ascites, comes in with 5 day history of altered mental status. Initially treated as hepatic encephalopathy.  Sensorium worsened, becoming somnolent. Patient with hypercapnic respiratory failure. Transferred to ICU. Intubated.  ASSESSMENT / PLAN:  PULMONARY A: Acute hypoxemic hypercapnic respiratory failure secondary to unable to protect airway, likely with severe sleep apnea untreated, possible aspiration pneumonitis. Patient also has been biting on his down all day with bleeding per mouth.  DIFFICULT AIRWAY P:   PRVC, 8 cc/kg Daily SBT / WUA Titrate O2 for sat of 88-92%. Rocephin for aspiration pneumonitis. Mouth guard to prevent further bleeding  If more bleeding per mouth, he will need ENT evaluation prior to extubation. He had a lot of blood prior to intubation which was suctioned out.  Will need cuff leak assessment prior to extubation  Pulmicort twice a day and DuoNeb 4 times a day.  CARDIOVASCULAR A:  Hypotension - off neo 3/23 0300 Hx hypertension  P:  Tele monitoring ECHO results as above   RENAL A:   Acute kidney injury/chronic kidney disease. Hypokalemia Hypomagnesemia  Hyponatremia  P:   KVO IVF once TF are at goal. Trend BMP, Mg, Phos Replace electrolytes as indicated.  GASTROINTESTINAL A:   Hepatic encephalopathy. Transaminitis. Alcoholic liver cirrhosis. H/O TIPS for refractory ascites. H/O Esophageal CA s/p esophagectomy  Possible SBP P:   GI following patient. Unable to place NGT, attempt IR placement, appreciate input Lactulose PR 2gm coverage with rocephin as above Trend ammonia intermittently   HEMATOLOGIC A:   Oral Bleeding - likely related to patient biting his lip/tongue Thrombocytopenia, elevated INR, related to liver cirrhosis, CKD.  History of Esophageal cancer for which he had surgery. P:  Observe bleeding per mouth. Monitor CBC, coags.  INFECTIOUS A:   Concern for aspiration pneumonitis, possible SBP. P:   Cultures as above Ceftriaxone will cover for both. Antibiotics are limited because of liver and renal disease.  Trend lactic  acid, PCT  ENDOCRINE A:   No active issues. P:   Check fingerstick every 4-6 hours. Keep CBG less than 180   NEUROLOGIC A:   Altered mental status likely related to hepatic encephalopathy. There was concern about CVA but MRI was unremarkable for any acute changes. Encephalopathy related to alcohol withdrawal, hypercapnea.  P:   RASS goal: -1 Daily WUA / SBT Versed gtt, limit added for 3mg /hr.  Want to avoid tachyphylaxis  PRN fentanyl  FAMILY  - Updates: Wife and son updated bedside 3/23. Reviewed pt's living will and EOL wishes.  He would not want life sustaining measures if it was not a reversible process.  Recommended no CPR in the event of arrest due to multiple medical problems but continuing current level of care.  Family is considering and will get back with RN regarding decision.   - Inter-disciplinary family meet or Palliative Care meeting due by:  3/28   Noe Gens, NP-C [facility] Pgr: [phone] or if no answer [phone] 07/07/2015, 10:25 AM  Attending Note:  70 year old male with liver failure and PNA vs SBP, neosyn being titrated down to off.  On exam opens eyes to command but no moving ext to command, moving spontaneously all ext.  Will replace all electrolytes.  IR to place NGT for TF.  Once TF are started will d/c IVF.  May need lasix once hemodynamically stable.  Other son is to arrive today for family discussion regarding plan of care.  Continue abx for now and f/u on cultures.  Plan family meeting in AM.  The patient is critically ill with multiple organ systems failure and requires high complexity decision making for assessment and support, frequent evaluation and titration of therapies, application of advanced monitoring technologies and extensive interpretation of multiple databases.   Critical Care Time devoted to patient care services described in this note is  35  Minutes. This time reflects time of care of this signee Dr Jennet Maduro.  This critical care time does not reflect procedure time, or teaching time or supervisory time of PA/NP/Med student/Med Resident etc but could involve care discussion time.  Rush Farmer, M.D. [facility]. Pager: [phone]. After hours pager: [phone].

## 2015-07-08 ENCOUNTER — Inpatient Hospital Stay (HOSPITAL_COMMUNITY): Payer: Medicare HMO

## 2015-07-08 LAB — COMPREHENSIVE METABOLIC PANEL
ALT: 39 U/L (ref 17–63)
AST: 109 U/L — ABNORMAL HIGH (ref 15–41)
Albumin: 2.7 g/dL — ABNORMAL LOW (ref 3.5–5.0)
Alkaline Phosphatase: 84 U/L (ref 38–126)
Anion gap: 9 (ref 5–15)
BILIRUBIN TOTAL: 1.1 mg/dL (ref 0.3–1.2)
BUN: 13 mg/dL (ref 6–20)
CALCIUM: 7.7 mg/dL — AB (ref 8.9–10.3)
CHLORIDE: 108 mmol/L (ref 101–111)
CO2: 20 mmol/L — ABNORMAL LOW (ref 22–32)
CREATININE: 1.6 mg/dL — AB (ref 0.61–1.24)
GFR, EST AFRICAN AMERICAN: 48 mL/min — AB (ref >60)
GFR, EST NON AFRICAN AMERICAN: 41 mL/min — AB (ref >60)
Glucose, Bld: 80 mg/dL (ref 65–99)
Potassium: 2.9 mmol/L — ABNORMAL LOW (ref 3.5–5.1)
SODIUM: 137 mmol/L (ref 135–145)
TOTAL PROTEIN: 5.4 g/dL — AB (ref 6.5–8.1)

## 2015-07-08 LAB — BASIC METABOLIC PANEL
Anion gap: 10 (ref 5–15)
BUN: 11 mg/dL (ref 6–20)
CALCIUM: 8 mg/dL — AB (ref 8.9–10.3)
CO2: 19 mmol/L — ABNORMAL LOW (ref 22–32)
CREATININE: 1.44 mg/dL — AB (ref 0.61–1.24)
Chloride: 107 mmol/L (ref 101–111)
GFR calc Af Amer: 55 mL/min — ABNORMAL LOW (ref >60)
GFR, EST NON AFRICAN AMERICAN: 47 mL/min — AB (ref >60)
Glucose, Bld: 98 mg/dL (ref 65–99)
Potassium: 3.3 mmol/L — ABNORMAL LOW (ref 3.5–5.1)
SODIUM: 136 mmol/L (ref 135–145)

## 2015-07-08 LAB — CBC
HCT: 26.2 % — ABNORMAL LOW (ref 39.0–52.0)
Hemoglobin: 9 g/dL — ABNORMAL LOW (ref 13.0–17.0)
MCH: 33.7 pg (ref 26.0–34.0)
MCHC: 34.4 g/dL (ref 30.0–36.0)
MCV: 98.1 fL (ref 78.0–100.0)
PLATELETS: 90 10*3/uL — AB (ref 150–400)
RBC: 2.67 MIL/uL — AB (ref 4.22–5.81)
RDW: 15 % (ref 11.5–15.5)
WBC: 4.6 10*3/uL (ref 4.0–10.5)

## 2015-07-08 LAB — AMMONIA: AMMONIA: 45 umol/L — AB (ref 9–35)

## 2015-07-08 MED ORDER — LIDOCAINE VISCOUS 2 % MT SOLN
OROMUCOSAL | Status: AC
  Filled 2015-07-08: qty 15

## 2015-07-08 MED ORDER — IOHEXOL 300 MG/ML  SOLN
25.0000 mL | Freq: Once | INTRAMUSCULAR | Status: AC | PRN
  Administered 2015-07-08: 25 mL via ORAL

## 2015-07-08 MED ORDER — LACTULOSE 10 GM/15ML PO SOLN
20.0000 g | Freq: Three times a day (TID) | ORAL | Status: DC
  Administered 2015-07-08 – 2015-07-15 (×21): 20 g via ORAL
  Filled 2015-07-08 (×22): qty 30

## 2015-07-08 MED ORDER — POTASSIUM CHLORIDE 20 MEQ/15ML (10%) PO SOLN
40.0000 meq | Freq: Once | ORAL | Status: AC
  Administered 2015-07-08: 40 meq
  Filled 2015-07-08: qty 30

## 2015-07-08 MED ORDER — FUROSEMIDE 10 MG/ML IJ SOLN
40.0000 mg | Freq: Four times a day (QID) | INTRAMUSCULAR | Status: AC
  Administered 2015-07-08 – 2015-07-09 (×3): 40 mg via INTRAVENOUS
  Filled 2015-07-08 (×3): qty 4

## 2015-07-08 MED ORDER — POTASSIUM CHLORIDE 10 MEQ/100ML IV SOLN
10.0000 meq | INTRAVENOUS | Status: AC
  Administered 2015-07-08 (×8): 10 meq via INTRAVENOUS
  Filled 2015-07-08 (×8): qty 100

## 2015-07-08 MED ORDER — FENTANYL CITRATE (PF) 100 MCG/2ML IJ SOLN
INTRAMUSCULAR | Status: AC
  Administered 2015-07-08: 100 ug
  Filled 2015-07-08: qty 2

## 2015-07-08 MED ORDER — MAGNESIUM SULFATE 2 GM/50ML IV SOLN
2.0000 g | Freq: Once | INTRAVENOUS | Status: AC
  Administered 2015-07-08: 2 g via INTRAVENOUS
  Filled 2015-07-08: qty 50

## 2015-07-08 MED ORDER — ETOMIDATE 2 MG/ML IV SOLN
20.0000 mg | Freq: Once | INTRAVENOUS | Status: AC
  Administered 2015-07-08: 20 mg via INTRAVENOUS

## 2015-07-08 MED ORDER — ROCURONIUM BROMIDE 50 MG/5ML IV SOLN
50.0000 mg | Freq: Once | INTRAVENOUS | Status: AC
Start: 2015-07-08 — End: 2015-07-08
  Administered 2015-07-08: 50 mg via INTRAVENOUS

## 2015-07-08 MED ORDER — MIDAZOLAM HCL 2 MG/2ML IJ SOLN
INTRAMUSCULAR | Status: AC
  Administered 2015-07-08: 2 mg
  Filled 2015-07-08: qty 2

## 2015-07-08 NOTE — Procedures (Signed)
Extubation Procedure Note  Patient Details:   Name: [name] DOB: 1946-03-01 MRN: [id number]   Airway Documentation:  Airway 7.5 mm (Active)  Secured at (cm) 26 cm 07/08/2015  3:04 PM  Measured From Lips 07/08/2015  3:04 PM  [place] 07/08/2015  3:04 PM  Secured By [employer] 07/08/2015  3:04 PM  Tube Holder Repositioned Yes 07/08/2015 11:00 AM  Cuff Pressure (cm H2O) 28 cm H2O 07/07/2015  4:00 AM  Site Condition Dry 07/08/2015  3:04 PM    Evaluation  O2 sats: transiently fell during during procedure Complications: Complications of desats, snoring respirations Patient did not tolerate procedure well. Bilateral Breath Sounds: Diminished   No  Hope Pigeon, MA 07/08/2015, 5:31 PM

## 2015-07-08 NOTE — Progress Notes (Signed)
Met w 2 sons. Pt lives w his wife. Pt has long term care policy. Sons to check on policy to see what coverage they have for extra help at home. Explained what hhc looks like w medicare. If a lot of heavy lifting needed wife will not be able to do alone. Will cont to follow and follow pt progress.

## 2015-07-08 NOTE — Progress Notes (Signed)
PULMONARY / CRITICAL CARE MEDICINE   Name: Shenandoah Nordland MRN: [id number] DOB: June 14, 1945    ADMISSION DATE:  07/04/2015 CONSULTATION DATE:  07/05/2015  REFERRING MD:  Family Medicine  CHIEF COMPLAINT:  AMS    SUBJECTIVE:  Sedate but arousable and agitated when off sedation.  Plan for U/S of the abdomen to evaluate patency of TIPS.  VITAL SIGNS: BP 111/69 mmHg  Pulse 54  Temp(Src) 97.8 F (36.6 C) (Oral)  Resp 16  Ht 6\' 2"  (1.88 m)  Wt 108.5 kg (239 lb 3.2 oz)  BMI 30.70 kg/m2  SpO2 100%  HEMODYNAMICS:    VENTILATOR SETTINGS: Vent Mode:  [-] CPAP;PSV FiO2 (%):  [30 %] 30 % Set Rate:  [16 bmp] 16 bmp Vt Set:  [620 mL] 620 mL PEEP:  [5 cmH20] 5 cmH20 Pressure Support:  [8 cmH20] 8 cmH20 Plateau Pressure:  [14 cmH20-18 cmH20] 18 cmH20  INTAKE / OUTPUT: I/O last 3 completed shifts: In: 5145.8 [I.V.:4341.2; IV Piggyback:804.6] Out: [id number] [Urine:1740]  PHYSICAL EXAMINATION: General:  Chronically ill appearing male on vent in NAD Neuro:  Sedate, WUA in he opened eyes and followed some commands but was agitated. HEENT:  PERLA, OETT, bloody oral secretions. Cardiovascular:  s1s2 rrr, no m/r/g Lungs:  Even/non-labored on SBT, lungs bilaterally clear with occasional soft wheeze Abdomen:  Obese/soft, bsx4 active  Musculoskeletal:  No acute deformities, 1+ pedal edema Skin:  Warm and dry. (-) rash.   LABS:  BMET  Recent Labs Lab 07/06/15 0250 07/07/15 0455 07/08/15 0330  NA 130* 135 137  K 3.5 3.0* 2.9*  CL 101 103 108  CO2 22 21* 20*  BUN 13 14 13   CREATININE 1.53* 1.66* 1.60*  GLUCOSE 88 80 80   Electrolytes  Recent Labs Lab 07/05/15 2036 07/06/15 0250 07/07/15 0455 07/08/15 0330  CALCIUM 8.3* 7.6* 7.5* 7.7*  MG 1.3*  --  1.6*  --   PHOS 3.5  --  2.1*  --    CBC  Recent Labs Lab 07/06/15 0250 07/07/15 0455 07/08/15 0330  WBC 5.4 5.0 4.6  HGB 9.6* 8.8* 9.0*  HCT 28.0* 25.6* 26.2*  PLT 108* 101* 90*   Coag's  Recent Labs Lab  07/05/15 2036  APTT 40*  INR 1.10   Sepsis Markers  Recent Labs Lab 07/05/15 2036 07/05/15 2308  LATICACIDVEN 1.9 2.4*  PROCALCITON <0.10  --    ABG  Recent Labs Lab 07/05/15 1859 07/05/15 2137 07/07/15 0410  PHART 7.218* 7.321* 7.454*  PCO2ART 65.8* 47.8* 28.4*  PO2ART 57.3* 223.0* 113*   Liver Enzymes  Recent Labs Lab 07/04/15 1134 07/06/15 0250 07/08/15 0330  AST 256* 180* 109*  ALT 66* 52 39  ALKPHOS 121 92 84  BILITOT 1.5* 1.5* 1.1  ALBUMIN 3.7 3.0* 2.7*   Cardiac Enzymes No results for input(s): TROPONINI, PROBNP in the last 168 hours.  Glucose  Recent Labs Lab 07/04/15 1150 07/05/15 1856  GLUCAP 86 103*   Imaging Dg Chest Port 1 View  07/08/2015  CLINICAL DATA:  Acute respiratory failure. Current history of hepatic encephalopathy and cirrhosis. Personal history of esophageal cancer. EXAM: PORTABLE CHEST 1 VIEW COMPARISON:  07/07/2015 and earlier. FINDINGS: Endotracheal tube tip projects approximately 2 cm above the carina. External pacing pads. Cardiac silhouette markedly enlarged, unchanged. Stable dense consolidation in the left lower lobe. Suboptimal inspiration with atelectasis at the right lung base. Lungs otherwise clear. Normal pulmonary vascularity. IMPRESSION: 1. Endotracheal tube tip projects approximately 2 cm above the carina. 2. Stable dense  left lower lobe atelectasis and/or pneumonia. 3. Stable cardiomegaly without pulmonary edema. 4. No new abnormalities. Electronically Signed   By: Evangeline Dakin M.D.   On: 07/08/2015 07:26   Dg Loyce Dys Tube Plc W/fl W/rad  07/08/2015  CLINICAL DATA:  70 year old with current history of hepatic cirrhosis and hepatic encephalopathy, ventilator dependent respiratory failure, in need of enteral feeding tube placement. Patient has had a prior esophagectomy and gastric pull through procedure, so feeding tube placement under fluoroscopy has been requested. EXAM: RADIOLOGIST PLACEMENT OF NASOENTERIC FEEDING TUBE  USING FLUOROSCOPY: CONTRAST:  2mL OMNIPAQUE IOHEXOL 300 MG/ML VIA THE FEEDING TUBE FLUOROSCOPY TIME:  Radiation Exposure Index (as provided by the fluoroscopic device): 5374.70 microGy meter2 COMPARISON:  Abdominal x-ray 07/04/2015. FINDINGS: The weighted tip of a Kangaroo feeding tube was removed and, with the aid of an angled glide wire and some effort, I was ultimately able to advance the feeding tube through the stomach into the 4th portion of the duodenum near the ligament of Treitz. This was confirmed with a hand injection of Omnipaque 300 contrast. The feeding tube was flushed and secured to the patient's nose. IMPRESSION: Fluoroscopically guided placement of a nasoenteric feeding tube with its tip in the 4th portion of the duodenum. The feeding tube is ready for immediate use. Electronically Signed   By: Evangeline Dakin M.D.   On: 07/08/2015 10:55   STUDIES:  CT Head 3/20 >>  Atrophy. No acute changes. MRI Head (3/20 >> No acute bleed. Intense signal and the globus pallidus maybe related to hepatic encephalopathy.  ECHO 3/22 >> mild hypertrophy, EF 55-60%, normal wall motion, mild MR  CULTURES: Blood culture 3/21 >> Trache culture 3/21 >> MRSA 3/21 >>  ANTIBIOTICS: Rocephin (3/21) >>   SIGNIFICANT EVENTS: 3/20 pt admitted for AMS after abrupt cessation of ETOH. Treated as hepatic encephalopathy. 3/21 pt became somnolent/obtunded. PCCM consulted for hypercapneic resp failure.   LINES/TUBES: ETT 3/21 >>  DISCUSSION: 70 male, known to have liver cirrhosis related to alcohol abuse, history of TIPS for refractory ascites, comes in with 5 day history of altered mental status. Initially treated as hepatic encephalopathy. Sensorium worsened, becoming somnolent. Patient with hypercapnic respiratory failure. Transferred to ICU. Intubated.  ASSESSMENT / PLAN:  PULMONARY A: Acute hypoxemic hypercapnic respiratory failure secondary to unable to protect airway, likely with severe sleep apnea  untreated, possible aspiration pneumonitis. Patient also has been biting on his down all day with bleeding per mouth.  DIFFICULT AIRWAY P:   PS trials but no extubation until mental status improves. Daily SBT / WUA Titrate O2 for sat of 88-92%. Rocephin for aspiration pneumonitis. Mouth guard to prevent further bleeding  Pulmicort twice a day and DuoNeb 4 times a day.  CARDIOVASCULAR A:  Hypotension - off neo 3/23 0300 Hx hypertension  P:  Tele monitoring ECHO EF if 55-60%  RENAL A:   Acute kidney injury/chronic kidney disease. Hypokalemia Hypomagnesemia  Hyponatremia  P:   KVO IVF once TF are at goal. Trend BMP, Mg, Phos Replace electrolytes as indicated.  GASTROINTESTINAL A:   Hepatic encephalopathy. Transaminitis. 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 Change lactulose to PO. TF per nutrition.  HEMATOLOGIC A:   Oral Bleeding - likely related to patient biting his lip/tongue Thrombocytopenia, elevated INR, related to liver cirrhosis, CKD.  History of Esophageal cancer for which he had surgery. P:  Observe  bleeding per mouth. Monitor CBC, coags.  INFECTIOUS A:   Concern for aspiration pneumonitis, possible SBP. P:   Cultures as above Ceftriaxone will cover for both. Antibiotics are limited because of liver and renal disease.  Trend lactic acid, PCT  ENDOCRINE A:   No active issues. P:   Check fingerstick every 4-6 hours. Keep CBG less than 180   NEUROLOGIC A:   Altered mental status likely related to hepatic encephalopathy. There was concern about CVA but MRI was unremarkable for any acute changes. Encephalopathy related to alcohol withdrawal, hypercapnea.  P:   RASS goal: -1 Daily WUA / SBT Versed gtt, limit added for 3mg /hr.  Want to avoid tachyphylaxis  PRN fentanyl Post U/S will stop versed in AM and likely extubate  as patient is weaning on 5/5 very well.  FAMILY  - Updates: Family updated bedside at length.  After discussion, will make patient a LCB with no CPR/cardioversion.  Will need to address code status farther pending neuro evaluation now that ammonia level has dropped.  - Inter-disciplinary family meet or Palliative Care meeting due by:  3/28  The patient is critically ill with multiple organ systems failure and requires high complexity decision making for assessment and support, frequent evaluation and titration of therapies, application of advanced monitoring technologies and extensive interpretation of multiple databases.   Critical Care Time devoted to patient care services described in this note is  35  Minutes. This time reflects time of care of this signee Dr Jennet Maduro. This critical care time does not reflect procedure time, or teaching time or supervisory time of PA/NP/Med student/Med Resident etc but could involve care discussion time.  Rush Farmer, M.D. [facility] Pulmonary/Critical Care Medicine. Pager: [phone]. After hours pager: [phone].

## 2015-07-08 NOTE — Progress Notes (Signed)
[place] Progress Note Patient Name: [name] DOB: Mar 30, 1946 MRN: [id number]   Date of Service  07/08/2015  HPI/Events of Note  K+ = 3.3 and Creatinine = 1.44.   eICU Interventions  Will replete K+.     Intervention Category Intermediate Interventions: Electrolyte abnormality - evaluation and management  [name],[name] 07/08/2015, 10:47 PM

## 2015-07-08 NOTE — Progress Notes (Signed)
Subjective: Remains on ventilator  Objective: Vital signs in last 24 hours: Temp:  [97.4 F (36.3 C)-97.8 F (36.6 C)] 97.8 F (36.6 C) (03/24 0800) Pulse Rate:  [54-78] 54 (03/24 0800) Resp:  [16] 16 (03/23 1644) BP: (94-133)/(59-96) 111/69 mmHg (03/24 0800) SpO2:  [97 %-100 %] 100 % (03/24 0800) FiO2 (%):  [30 %] 30 % (03/24 0800) Weight:  [108.5 kg (239 lb 3.2 oz)] 108.5 kg (239 lb 3.2 oz) (03/24 0400) Weight change: 3.8 kg (8 lb 6 oz) Last BM Date: 07/07/15  PE: GEN:  Intubated, sedated on ventilator  Lab Results: CBC    Component Value Date/Time   WBC 4.6 07/08/2015 0330   WBC 3.2* 08/03/2005 0903   RBC 2.67* 07/08/2015 0330   RBC 3.49* 08/03/2005 0903   HGB 9.0* 07/08/2015 0330   HGB 11.9* 08/03/2005 0903   HCT 26.2* 07/08/2015 0330   HCT 34.2* 08/03/2005 0903   PLT 90* 07/08/2015 0330   PLT 208 08/03/2005 0903   MCV 98.1 07/08/2015 0330   MCV 98.1* 08/03/2005 0903   MCH 33.7 07/08/2015 0330   MCH 34.1* 08/03/2005 0903   MCHC 34.4 07/08/2015 0330   MCHC 34.8 08/03/2005 0903   RDW 15.0 07/08/2015 0330   RDW 13.4 08/03/2005 0903   LYMPHSABS 0.4* 08/03/2005 0903   MONOABS 0.4 08/03/2005 0903   EOSABS 0.1 08/03/2005 0903   BASOSABS 0.0 08/03/2005 0903   CMP     Component Value Date/Time   NA 137 07/08/2015 0330   K 2.9* 07/08/2015 0330   CL 108 07/08/2015 0330   CO2 20* 07/08/2015 0330   GLUCOSE 80 07/08/2015 0330   BUN 13 07/08/2015 0330   CREATININE 1.60* 07/08/2015 0330   CREATININE 1.77* 06/30/2015 1227   CALCIUM 7.7* 07/08/2015 0330   PROT 5.4* 07/08/2015 0330   ALBUMIN 2.7* 07/08/2015 0330   AST 109* 07/08/2015 0330   ALT 39 07/08/2015 0330   ALKPHOS 84 07/08/2015 0330   BILITOT 1.1 07/08/2015 0330   GFRNONAA 41* 07/08/2015 0330   GFRNONAA 38* 06/30/2015 1227   GFRAA 48* 07/08/2015 0330   GFRAA 43* 06/30/2015 1227   Assessment:  1. Respiratory failure. On ventilator.  Suspect component of sleep apnea. Unclear how much his liver  disease is playing a role, as patient was completely lucid yesterday morning. 2. 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 3. Refractory ascites with TIPs. 4. Confusion, unclear etiology, possible component of hepatic encephalopathy.  Receiving lactulose enemas.  Plan:  1.  TIPS patency study planned, although an occluded TIPS would be expected to improve, not worsen, mental status. 2.  Continue lactulose enemas. 3.  Altered mental status work-up per primary team and neurology consultants. 4.  Would not check surveillance ammonia levels, as there is scant, if any, correlation between ammonia levels and degree of hepatic encephalopathy. 5.  No further GI recommendations at this time; will sign-off; please call with questions; thank you for the consultation.   Landry Dyke 07/08/2015, 8:49 AM   Pager [phone] If no answer or after 5 PM call [phone]

## 2015-07-08 NOTE — Progress Notes (Signed)
CRITICAL VALUE ALERT  Critical value received:  Potassium 2.9  Date of notification:  No notification from lab.  Reginal Lutes, MD was assisting another patient.  Reported potassium value to [place].

## 2015-07-08 NOTE — Progress Notes (Signed)
Notified MD about bruising and hardness around pts neck. MD to bedside to assess. No new orders placed. Will continue to assess and monitor closely.

## 2015-07-08 NOTE — Progress Notes (Signed)
Pt did not tolerate extubation, pt reintubated by MD.

## 2015-07-08 NOTE — Procedures (Signed)
Intubation Procedure Note Judiah Pawley [id number] 07/26/1945  Procedure: Intubation Indications: Respiratory insufficiency  Procedure Details Consent: Risks of procedure as well as the alternatives and risks of each were explained to the (patient/caregiver).  Consent for procedure obtained. Time Out: Verified patient identification, verified procedure, site/side was marked, verified correct patient position, special equipment/implants available, medications/allergies/relevent history reviewed, required imaging and test results available.  Performed  Maximum sterile technique was used including gloves, hand hygiene and mask.  MAC    Evaluation Hemodynamic Status: BP stable throughout; O2 sats: stable throughout Patient's Current Condition: stable Complications: No apparent complications Patient did tolerate procedure well. Chest X-ray ordered to verify placement.  CXR: pending.   [name] 07/08/2015

## 2015-07-08 NOTE — Progress Notes (Addendum)
[facility] Acute Care ADULT ICU REPLACEMENT PROTOCOL FOR AM LAB REPLACEMENT ONLY  The patient does apply for the [facility] Adult ICU Electrolyte Replacment Protocol based on the criteria listed below:   1. Is GFR >/= 40 ml/min? Yes.    Patient's GFR today is 48 2. Is urine output >/= 0.5 ml/kg/hr for the last 6 hours? Yes.   Patient's UOP is 0.62 ml/kg/hr 3. Is BUN < 60 mg/dL? Yes.    Patient's BUN today is 13 4. Abnormal electrolyte(s):  K - 2.9 5. Ordered repletion with: PER PROTOCOL 6. If a panic level lab has been reported, has the CCM MD in charge been notified? Yes.  .   Physician:  Dr. Arcelia Jew 07/08/2015 4:44 AM

## 2015-07-08 NOTE — Progress Notes (Signed)
K+ 3.3 called to [place]. Rileyann Florance, Carolynn Comment

## 2015-07-09 ENCOUNTER — Inpatient Hospital Stay (HOSPITAL_COMMUNITY): Payer: Medicare HMO

## 2015-07-09 ENCOUNTER — Encounter (HOSPITAL_COMMUNITY): Payer: Self-pay | Admitting: *Deleted

## 2015-07-09 LAB — PHOSPHORUS: PHOSPHORUS: 2.3 mg/dL — AB (ref 2.5–4.6)

## 2015-07-09 LAB — BLOOD GAS, ARTERIAL
ACID-BASE DEFICIT: 5.5 mmol/L — AB (ref 0.0–2.0)
Bicarbonate: 18.4 mEq/L — ABNORMAL LOW (ref 20.0–24.0)
DRAWN BY: 42624
FIO2: 0.4
O2 Saturation: 98.8 %
PCO2 ART: 30.3 mmHg — AB (ref 35.0–45.0)
PEEP/CPAP: 5 cmH2O
PH ART: 7.4 (ref 7.350–7.450)
Patient temperature: 98.6
RATE: 16 resp/min
TCO2: 19.3 mmol/L (ref 0–100)
VT: 620 mL
pO2, Arterial: 174 mmHg — ABNORMAL HIGH (ref 80.0–100.0)

## 2015-07-09 LAB — CBC
HEMATOCRIT: 27.7 % — AB (ref 39.0–52.0)
HEMOGLOBIN: 10 g/dL — AB (ref 13.0–17.0)
MCH: 35.5 pg — ABNORMAL HIGH (ref 26.0–34.0)
MCHC: 36.1 g/dL — ABNORMAL HIGH (ref 30.0–36.0)
MCV: 98.2 fL (ref 78.0–100.0)
Platelets: 81 10*3/uL — ABNORMAL LOW (ref 150–400)
RBC: 2.82 MIL/uL — AB (ref 4.22–5.81)
RDW: 15.1 % (ref 11.5–15.5)
WBC: 3.6 10*3/uL — AB (ref 4.0–10.5)

## 2015-07-09 LAB — BASIC METABOLIC PANEL
ANION GAP: 10 (ref 5–15)
Anion gap: 10 (ref 5–15)
BUN: 10 mg/dL (ref 6–20)
BUN: 10 mg/dL (ref 6–20)
CALCIUM: 7.9 mg/dL — AB (ref 8.9–10.3)
CHLORIDE: 108 mmol/L (ref 101–111)
CHLORIDE: 110 mmol/L (ref 101–111)
CO2: 20 mmol/L — AB (ref 22–32)
CO2: 19 mmol/L — AB (ref 22–32)
CREATININE: 1.44 mg/dL — AB (ref 0.61–1.24)
Calcium: 7.9 mg/dL — ABNORMAL LOW (ref 8.9–10.3)
Creatinine, Ser: 1.39 mg/dL — ABNORMAL HIGH (ref 0.61–1.24)
GFR calc non Af Amer: 49 mL/min — ABNORMAL LOW (ref >60)
GFR calc non Af Amer: 47 mL/min — ABNORMAL LOW (ref >60)
GFR, EST AFRICAN AMERICAN: 57 mL/min — AB (ref >60)
GFR, EST AFRICAN AMERICAN: 55 mL/min — AB (ref >60)
Glucose, Bld: 89 mg/dL (ref 65–99)
Glucose, Bld: 80 mg/dL (ref 65–99)
POTASSIUM: 3 mmol/L — AB (ref 3.5–5.1)
Potassium: 3.2 mmol/L — ABNORMAL LOW (ref 3.5–5.1)
Sodium: 138 mmol/L (ref 135–145)
Sodium: 139 mmol/L (ref 135–145)

## 2015-07-09 LAB — GLUCOSE, CAPILLARY
GLUCOSE-CAPILLARY: 127 mg/dL — AB (ref 65–99)
Glucose-Capillary: 75 mg/dL (ref 65–99)
Glucose-Capillary: 83 mg/dL (ref 65–99)
Glucose-Capillary: 92 mg/dL (ref 65–99)

## 2015-07-09 LAB — MAGNESIUM: MAGNESIUM: 1.8 mg/dL (ref 1.7–2.4)

## 2015-07-09 MED ORDER — POTASSIUM CHLORIDE CRYS ER 20 MEQ PO TBCR: 40.0000 meq | EXTENDED_RELEASE_TABLET | Freq: Once | ORAL | Status: DC

## 2015-07-09 MED ORDER — VITAL AF 1.2 CAL PO LIQD
1000.0000 mL | ORAL | Status: DC
Start: 2015-07-09 — End: 2015-07-18
  Administered 2015-07-09 – 2015-07-11 (×4): 1000 mL
  Filled 2015-07-09 (×3): qty 1000

## 2015-07-09 MED ORDER — VITAL HIGH PROTEIN PO LIQD: 1000.0000 mL | ORAL | Status: DC

## 2015-07-09 MED ORDER — DEXAMETHASONE SODIUM PHOSPHATE 4 MG/ML IJ SOLN
4.0000 mg | Freq: Four times a day (QID) | INTRAMUSCULAR | Status: DC
  Administered 2015-07-09 – 2015-07-11 (×7): 4 mg via INTRAVENOUS
  Filled 2015-07-09 (×7): qty 1

## 2015-07-09 MED ORDER — SODIUM CHLORIDE 0.9 % IV BOLUS (SEPSIS)
500.0000 mL | Freq: Once | INTRAVENOUS | Status: AC
  Administered 2015-07-09: 500 mL via INTRAVENOUS

## 2015-07-09 MED ORDER — POTASSIUM CHLORIDE 20 MEQ/15ML (10%) PO SOLN
30.0000 meq | ORAL | Status: AC
  Administered 2015-07-09 (×2): 30 meq
  Filled 2015-07-09 (×2): qty 30

## 2015-07-09 MED ORDER — POTASSIUM CHLORIDE 20 MEQ/15ML (10%) PO SOLN
40.0000 meq | Freq: Once | ORAL | Status: AC
  Administered 2015-07-09: 40 meq via ORAL

## 2015-07-09 MED ORDER — PNEUMOCOCCAL VAC POLYVALENT 25 MCG/0.5ML IJ INJ
0.5000 mL | INJECTION | INTRAMUSCULAR | Status: AC
  Administered 2015-07-10: 0.5 mL via INTRAMUSCULAR
  Filled 2015-07-09: qty 0.5

## 2015-07-09 NOTE — Progress Notes (Signed)
07/09/15 0001  Unable to complete CIWA d/t Pt intubated and on sedation.

## 2015-07-09 NOTE — Progress Notes (Signed)
Pt is on  FS tolerating it well no distress or complications noted

## 2015-07-09 NOTE — Progress Notes (Signed)
Wasted 30 mL of Versed in the sink with Cherylann Ratel RN

## 2015-07-09 NOTE — Progress Notes (Addendum)
Nutrition Follow-up  DOCUMENTATION CODES:   Not applicable  INTERVENTION:   Initiate Vital AF 1.2 @ 20 ml/hr via OGT and increase by 10 ml every 4 hours to goal rate of 80 ml/hr.   Tube feeding regimen provides 2304 kcal (100% of needs), 144 grams of protein, and 1557 ml of H2O.   -Recommend monitoring magnesium, potassium, and phosphorus daily for at least 3 days, MD to replete as needed, as patient is at risk for refeeding syndrome given poor po intake over the past week and low electrolyte lab values.  NUTRITION DIAGNOSIS:   Inadequate oral intake related to inability to eat as evidenced by NPO status.  Ongoing  GOAL:   Patient will meet greater than or equal to 90% of their needs  Unmet  MONITOR:   Vent status, Labs, Weight trends, TF tolerance, Skin, I & O's  REASON FOR ASSESSMENT:   Consult Enteral/tube feeding initiation and management  ASSESSMENT:   70 y/o male with PMH alcoholic liver cirrhosis s/p TIPS procedure 01/2015 for refractory ascites, Esophogeal Ca s/p total esophagectomy (2002), HTN, and Gout.  Presents with worsening encephalopathy/AMS. Wife noted increased hallucinations, altered mental status, and unsteadiness while ambulating over the past few days.  Reports decreased appetite but says he has been drinking a lot of water over the last few days.  Patients last alcoholic drink was 0000000.   Pt was extubated on 07/08/15, but re-intubated, due to failed extubation.  Patient intubated on ventilator support. OGT in place.  MV: 14.6 L/min Temp (24hrs), Avg:98.2 F (36.8 C), Min:97.4 F (36.3 C), Max:98.9 F (37.2 C)  Propofol: n/a  GI consulted; plan for TIPS patency study. Pt remains on lactulose.  RD received consult for TF initiation and management.   Labs reviewed: K: 3.2 (being repleted), Phos: 2.3, Mg WDL.   Diet Order:  Diet NPO time specified  Skin:  Reviewed, no issues  Last BM:  07/08/15  Height:   Ht Readings from Last 1  Encounters:  07/08/15 6\' 2"  (1.88 m)    Weight:   Wt Readings from Last 1 Encounters:  07/09/15 224 lb 13.9 oz (102 kg)    Ideal Body Weight:  86.4 kg  BMI:  Body mass index is 28.86 kg/(m^2).  Estimated Nutritional Needs:   Kcal:  2224  Protein:  130-145 grams  Fluid:  >2.0 L  EDUCATION NEEDS:   No education needs identified at this time  Truc Winfree A. Jimmye Norman, RD, LDN, CDE Pager: [phone] After hours Pager: [phone]

## 2015-07-09 NOTE — Progress Notes (Signed)
PULMONARY / CRITICAL CARE MEDICINE   Name: [name] MRN: [id number] DOB: [birth year]    ADMISSION DATE:  07/04/2015 CONSULTATION DATE:  07/05/2015  REFERRING MD:  Family Medicine  CHIEF COMPLAINT:  AMS    SUBJECTIVE:   RN reports pt off sedation since early am, follows commands, net neg 7500 in last 24 hours, BP soft this am.  Pt was briefly extubated and failed pm 3/24  VITAL SIGNS: BP 77/56 mmHg  Pulse 58  Temp(Src) 98.6 F (37 C) (Oral)  Resp 14  Ht 6\' 2"  (1.88 m)  Wt 224 lb 13.9 oz (102 kg)  BMI 28.86 kg/m2  SpO2 100%  HEMODYNAMICS:    VENTILATOR SETTINGS: Vent Mode:  [-] PRVC FiO2 (%):  [30 %-40 %] 40 % Set Rate:  [16 bmp] 16 bmp Vt Set:  [620 mL] 620 mL PEEP:  [5 cmH20] 5 cmH20 Pressure Support:  [5 cmH20-8 cmH20] 5 cmH20 Plateau Pressure:  [13 cmH20-19 cmH20] 19 cmH20  INTAKE / OUTPUT: I/O last 3 completed shifts: In: 52 [I.V.:4236; IV Piggyback:500] Out: 7960 [Urine:7960]  PHYSICAL EXAMINATION: General:  Chronically ill appearing male on vent in NAD Neuro:  Opens eyes, follows simple commands HEENT:  PERLA, OETT  Cardiovascular:  s1s2 rrr, no m/r/g Lungs:  Even/non-labored, lungs bilaterally clear with soft wheeze on R Abdomen:  Obese/soft, bsx4 active  Musculoskeletal:  No acute deformities, 1+ pedal edema Skin:  Warm and dry. (-) rash.   LABS:  BMET  Recent Labs Lab 07/08/15 2051 07/09/15 0231 07/09/15 0637  NA 136 138 139  K 3.3* 3.0* 3.2*  CL 107 108 110  CO2 19* 20* 19*  BUN 11 10 10   CREATININE 1.44* 1.39* 1.44*  GLUCOSE 98 89 80   Electrolytes  Recent Labs Lab 07/05/15 2036  07/07/15 0455  07/08/15 2051 07/09/15 0231 07/09/15 0637  CALCIUM 8.3*  < > 7.5*  < > 8.0* 7.9* 7.9*  MG 1.3*  --  1.6*  --   --  1.8  --   PHOS 3.5  --  2.1*  --   --  2.3*  --   < > = values in this interval not displayed. CBC  Recent Labs Lab 07/07/15 0455 07/08/15 0330 07/09/15 0231  WBC 5.0 4.6 3.6*  HGB 8.8* 9.0* 10.0*  HCT 25.6*  26.2* 27.7*  PLT 101* 90* 81*   Coag's  Recent Labs Lab 07/05/15 2036  APTT 40*  INR 1.10   Sepsis Markers  Recent Labs Lab 07/05/15 2036 07/05/15 2308  LATICACIDVEN 1.9 2.4*  PROCALCITON <0.10  --    ABG  Recent Labs Lab 07/05/15 2137 07/07/15 0410 07/09/15 0445  PHART 7.321* 7.454* 7.400  PCO2ART 47.8* 28.4* 30.3*  PO2ART 223.0* 113* 174*   Liver Enzymes  Recent Labs Lab 07/04/15 1134 07/06/15 0250 07/08/15 0330  AST 256* 180* 109*  ALT 66* 52 39  ALKPHOS 121 92 84  BILITOT 1.5* 1.5* 1.1  ALBUMIN 3.7 3.0* 2.7*   Cardiac Enzymes No results for input(s): TROPONINI, PROBNP in the last 168 hours.  Glucose  Recent Labs Lab 07/04/15 1150 07/05/15 1856  GLUCAP 86 103*   Imaging [place] Art/ven Flow Abd Pelv Doppler  07/09/2015  CLINICAL DATA:  70 year old male with cirrhosis status post prior TIPS creation for medically refractory ascites. Patient currently hospitalized with acute altered mental status. Evaluate for TIPS occlusion. The TIPS was placed 02/09/2015 EXAM: DUPLEX ULTRASOUND OF LIVER AND TIPS SHUNT TECHNIQUE: Color and duplex Doppler ultrasound was performed  to evaluate the hepatic in-flow and out-flow vessels. COMPARISON:  Prior TIPS ultrasound 03/29/2015 FINDINGS: TIPS Shunt Velocities Proximal:  134 cm/sec Mid:  88 Distal:  119 cm/sec Portal Vein Velocities Main:  41 cm/sec with normal hepatopetal directional flow Right:  68 cm/sec with normal hepatopetal directional flow Left:  Not well seen Hepatic Vein Velocities Right:  Not well seen Middle:  32 centimeters/second Left:  Not well seen Hepatic Artery Velocity:  71 centimeter/second Splenic Vein Velocity:  42 cm/second Varices:  None.  Mildly prominent splenic hilar vasculature. Ascites: Small volume ascites and right-sided pleural effusion. Other findings: Cirrhotic morphology of the liver. No discrete lesions. IMPRESSION: 1. Patent middle hepatic to right portal venous TIPS. No evidence of stenosis or  occlusion. 2. The right and left hepatic veins are not well seen on this study. 3. Trace ascites and small right pleural effusion. Signed, Criselda Peaches, MD [facility] Electronically Signed   By: Jacqulynn Cadet M.D.   On: 07/09/2015 09:21   Dg Chest Port 1 View  07/09/2015  CLINICAL DATA:  Respiratory distress. EXAM: PORTABLE CHEST 1 VIEW COMPARISON:  07/08/2015. FINDINGS: Tubes and lines are stable. ET tube remains slightly low, Less than 2 cm above carina. Cardiomegaly. Retrocardiac density suggesting LEFT lower lobe atelectasis. No effusion or consolidation. IMPRESSION: Worsening aeration. ET tube slightly low. This should be withdrawn approximately 3 cm. Electronically Signed   By: Staci Righter M.D.   On: 07/09/2015 10:03   Dg Chest Port 1 View  07/08/2015  CLINICAL DATA:  Intubated EXAM: PORTABLE CHEST 1 VIEW COMPARISON:  07/08/2015 FINDINGS: Cardiomediastinal silhouette is stable. Endotracheal tube in place with tip 1.7 cm above the carina. There is no pneumothorax. There is bilateral basilar atelectasis. Endotracheal tube with tip within stomach. There is mild curving of the NG tube within moderate size hiatal hernia. No segmental infiltrate or pulmonary edema. IMPRESSION: Endotracheal tube in place. NG tube with tip within distal stomach. Mild tortuous NG tube within hiatal hernia. No pneumothorax. Mild basilar atelectasis. No segmental infiltrate. Electronically Signed   By: Lahoma Crocker M.D.   On: 07/08/2015 17:34   Dg Loyce Dys Tube Plc W/fl W/rad  07/08/2015  CLINICAL DATA:  70 year old with current history of hepatic cirrhosis and hepatic encephalopathy, ventilator dependent respiratory failure, in need of enteral feeding tube placement. Patient has had a prior esophagectomy and gastric pull through procedure, so feeding tube placement under fluoroscopy has been requested. EXAM: RADIOLOGIST PLACEMENT OF NASOENTERIC FEEDING TUBE  USING FLUOROSCOPY: CONTRAST:  110mL OMNIPAQUE IOHEXOL 300 MG/ML VIA THE FEEDING TUBE FLUOROSCOPY TIME:  Radiation Exposure Index (as provided by the fluoroscopic device): 5374.70 microGy meter2 COMPARISON:  Abdominal x-ray 07/04/2015. FINDINGS: The weighted tip of a Kangaroo feeding tube was removed and, with the aid of an angled glide wire and some effort, I was ultimately able to advance the feeding tube through the stomach into the 4th portion of the duodenum near the ligament of Treitz. This was confirmed with a hand injection of Omnipaque 300 contrast. The feeding tube was flushed and secured to the patient's nose. IMPRESSION: Fluoroscopically guided placement of a nasoenteric feeding tube with its tip in the 4th portion of the duodenum. The feeding tube is ready for immediate use. Electronically Signed   By: Evangeline Dakin M.D.   On: 07/08/2015 10:55   STUDIES:  CT Head 3/20 >>  Atrophy. No acute changes. MRI Head (3/20 >> No acute bleed. Intense signal and the  globus pallidus maybe related to hepatic encephalopathy.  ECHO 3/22 >> mild hypertrophy, EF 55-60%, normal wall motion, mild MR [place] ABD 3/24 >> patent middle hepatic to R portal venous TIPS, trace ascites, small R pleural effusion   CULTURES: Blood culture 3/21 >>  Trach culture 3/21 >> MRSA 3/21 >> neg  ANTIBIOTICS: Rocephin (3/21) >>   SIGNIFICANT EVENTS: 3/20 pt admitted for AMS after abrupt cessation of ETOH. Treated as hepatic encephalopathy. 3/21 pt became somnolent/obtunded. PCCM consulted for hypercapneic resp failure.   LINES/TUBES: ETT 3/21 >> 3/24, 3/24 >>  DISCUSSION: 34 male, known to have liver cirrhosis related to alcohol abuse, history of TIPS for refractory ascites, comes in with 5 day history of altered mental status. Initially treated as hepatic encephalopathy. Sensorium worsened, becoming somnolent developed hypercapnic respiratory failure. Transferred to ICU. Intubated.  ASSESSMENT /  PLAN:  PULMONARY A: Acute hypoxemic hypercapnic respiratory failure secondary to unable to protect airway, likely with severe sleep apnea untreated, possible aspiration pneumonitis. Patient also has been biting on his down all day with bleeding per mouth.  DIFFICULT AIRWAY Failed extubation 3/24 PM P:   PS trials but no extubation until mental status improves Daily SBT / WUA Titrate O2 for sat of 88-92% Pulmicort twice a day and DuoNeb 4 times a day Intermittent CXR Will need cuff leak assessment prior to extubation  CARDIOVASCULAR A:  Hypotension - off neo 3/23 0300, Echo with EF 55-60% Hx hypertension  P:  Tele monitoring LCB - no CPR  RENAL A:   Acute kidney injury/chronic kidney disease. 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  History of Esophageal cancer for which he had surgery. P:  Observe for further oral bleeding  Monitor CBC, coags SCD's for DVT prophylaxis   INFECTIOUS A:   Concern for aspiration pneumonitis, possible SBP. P:   Cultures as above Ceftriaxone will cover for both. Antibiotics are limited because of liver and renal disease Trend lactic acid, PCT  ENDOCRINE A:   No active issues. P:   Check fingerstick every 4-6 hours  NEUROLOGIC A:   Altered mental status likely related to hepatic encephalopathy. There was concern about CVA but MRI was unremarkable for any acute changes. Encephalopathy related to alcohol withdrawal, hypercapnea.  P:   RASS  goal: 0 Daily WUA / SBT PRN fentanyl D/C versed gtt, PRN for sedation     FAMILY  - Updates:  No family at bedside am 3/25.    - Inter-disciplinary family meet or Palliative Care meeting due by:  3/28   Noe Gens, NP-C  Pulmonary & Critical Care Pgr: [phone] or if no answer [phone] 07/09/2015, 10:25 AM

## 2015-07-09 NOTE — Progress Notes (Signed)
[facility] ADULT ICU REPLACEMENT PROTOCOL FOR AM LAB REPLACEMENT ONLY  The patient does apply for the [facility] Adult ICU Electrolyte Replacment Protocol based on the criteria listed below:   1. Is GFR >/= 40 ml/min? Yes.    Patient's GFR today is 49 2. Is urine output >/= 0.5 ml/kg/hr for the last 6 hours? Yes.   Patient's UOP is 2.45 ml/kg/hr 3. Is BUN < 60 mg/dL? Yes.    Patient's BUN today is 10 4. Abnormal electrolyte(s):  K - 3.0 5. Ordered repletion with: PER PROTOCOL 6. If a panic level lab has been reported, has the CCM MD in charge been notified? Yes.  .   Physician:  Dr. Arcelia Jew 07/09/2015 3:39 AM

## 2015-07-10 ENCOUNTER — Inpatient Hospital Stay (HOSPITAL_COMMUNITY): Payer: Medicare HMO

## 2015-07-10 DIAGNOSIS — K7040 Alcoholic hepatic failure without coma: Secondary | ICD-10-CM | POA: Diagnosis not present

## 2015-07-10 LAB — GLUCOSE, CAPILLARY
GLUCOSE-CAPILLARY: 122 mg/dL — AB (ref 65–99)
GLUCOSE-CAPILLARY: 118 mg/dL — AB (ref 65–99)
GLUCOSE-CAPILLARY: 137 mg/dL — AB (ref 65–99)
Glucose-Capillary: 139 mg/dL — ABNORMAL HIGH (ref 65–99)
Glucose-Capillary: 142 mg/dL — ABNORMAL HIGH (ref 65–99)
Glucose-Capillary: 129 mg/dL — ABNORMAL HIGH (ref 65–99)

## 2015-07-10 LAB — BASIC METABOLIC PANEL
ANION GAP: 12 (ref 5–15)
BUN: 11 mg/dL (ref 6–20)
CALCIUM: 8.2 mg/dL — AB (ref 8.9–10.3)
CO2: 16 mmol/L — AB (ref 22–32)
Chloride: 111 mmol/L (ref 101–111)
Creatinine, Ser: 1.68 mg/dL — ABNORMAL HIGH (ref 0.61–1.24)
GFR calc Af Amer: 45 mL/min — ABNORMAL LOW (ref >60)
GFR, EST NON AFRICAN AMERICAN: 39 mL/min — AB (ref >60)
GLUCOSE: 164 mg/dL — AB (ref 65–99)
POTASSIUM: 3.4 mmol/L — AB (ref 3.5–5.1)
Sodium: 139 mmol/L (ref 135–145)

## 2015-07-10 LAB — CBC
HCT: 29 % — ABNORMAL LOW (ref 39.0–52.0)
Hemoglobin: 9.8 g/dL — ABNORMAL LOW (ref 13.0–17.0)
MCH: 33.6 pg (ref 26.0–34.0)
MCHC: 33.8 g/dL (ref 30.0–36.0)
MCV: 99.3 fL (ref 78.0–100.0)
PLATELETS: 105 10*3/uL — AB (ref 150–400)
RBC: 2.92 MIL/uL — AB (ref 4.22–5.81)
RDW: 15.4 % (ref 11.5–15.5)
WBC: 5.4 10*3/uL (ref 4.0–10.5)

## 2015-07-10 LAB — CULTURE, BLOOD (ROUTINE X 2): Culture: NO GROWTH

## 2015-07-10 MED ORDER — POTASSIUM CHLORIDE 20 MEQ/15ML (10%) PO SOLN
40.0000 meq | Freq: Once | ORAL | Status: AC
  Administered 2015-07-10: 40 meq
  Filled 2015-07-10: qty 30

## 2015-07-10 NOTE — Progress Notes (Signed)
Reading Progress Note Patient Name: [name] DOB: 11/12/1945 MRN: [id number]   Date of Service  07/10/2015  HPI/Events of Note  K+ = 3.4 and Creatinine = 1.68.  eICU Interventions  Will replete K+.      Intervention Category Intermediate Interventions: Electrolyte abnormality - evaluation and management  [name],[name] 07/10/2015, 4:26 AM

## 2015-07-10 NOTE — Progress Notes (Signed)
Failed wean. Due to agitation, tried to encourage patient to take slow deep breaths but was unsuccessful. Pt RR began to steadily rise. No other complications noted. RN Rose aware.

## 2015-07-10 NOTE — Progress Notes (Signed)
[place] read by Dr. Laurence Ferrari.  TIPS  Patent.  Agree with current care.  WENDY S BLAIR PA-C 07/10/2015 12:03 PM

## 2015-07-10 NOTE — Progress Notes (Signed)
PULMONARY / CRITICAL CARE MEDICINE   Name: [name] MRN: [id number] DOB: [birth year]    ADMISSION DATE:  07/04/2015 CONSULTATION DATE:  07/05/2015  REFERRING MD:  Family Medicine  CHIEF COMPLAINT:  AMS    SUBJECTIVE:   RT reports pt weaning on PSV, no cuff leak on exam.  Intermittent periods of agitation.  Tolerating wean better than earlier in am.  Afebrile / VSS.   VITAL SIGNS: BP 116/77 mmHg  Pulse 99  Temp(Src) 97.5 F (36.4 C) (Oral)  Resp 14  Ht 6\' 2"  (1.88 m)  Wt 225 lb 15.5 oz (102.5 kg)  BMI 29.00 kg/m2  SpO2 100%  HEMODYNAMICS:    VENTILATOR SETTINGS: Vent Mode:  [-] CPAP;PSV FiO2 (%):  [40 %] 40 % Set Rate:  [16 bmp] 16 bmp Vt Set:  [620 mL] 620 mL PEEP:  [5 cmH20] 5 cmH20 Pressure Support:  [5 cmH20-10 cmH20] 5 cmH20 Plateau Pressure:  [17 cmH20-19 cmH20] 17 cmH20  INTAKE / OUTPUT: I/O last 3 completed shifts: In: 2311 [I.V.:1783; NG/GT:428; IV Piggyback:100] Out: 6960 [Urine:6960]  PHYSICAL EXAMINATION: General:  Chronically ill appearing male on vent in NAD Neuro:  Opens eyes, follows commands, more alert  HEENT:  PERLA, OETT  Cardiovascular:  s1s2 rrr, no m/r/g Lungs:  Even/non-labored, lungs bilaterally clear  Abdomen:  Obese/soft, bsx4 active  Musculoskeletal:  No acute deformities, trace pedal edema Skin:  Warm and dry. (-) rash.   LABS:  BMET  Recent Labs Lab 07/09/15 0231 07/09/15 0637 07/10/15 0237  NA 138 139 139  K 3.0* 3.2* 3.4*  CL 108 110 111  CO2 20* 19* 16*  BUN 10 10 11   CREATININE 1.39* 1.44* 1.68*  GLUCOSE 89 80 164*   Electrolytes  Recent Labs Lab 07/05/15 2036  07/07/15 0455  07/09/15 0231 07/09/15 0637 07/10/15 0237  CALCIUM 8.3*  < > 7.5*  < > 7.9* 7.9* 8.2*  MG 1.3*  --  1.6*  --  1.8  --   --   PHOS 3.5  --  2.1*  --  2.3*  --   --   < > = values in this interval not displayed. CBC  Recent Labs Lab 07/08/15 0330 07/09/15 0231 07/10/15 0237  WBC 4.6 3.6* 5.4  HGB 9.0* 10.0* 9.8*  HCT  26.2* 27.7* 29.0*  PLT 90* 81* 105*   Coag's  Recent Labs Lab 07/05/15 2036  APTT 40*  INR 1.10   Sepsis Markers  Recent Labs Lab 07/05/15 2036 07/05/15 2308  LATICACIDVEN 1.9 2.4*  PROCALCITON <0.10  --    ABG  Recent Labs Lab 07/05/15 2137 07/07/15 0410 07/09/15 0445  PHART 7.321* 7.454* 7.400  PCO2ART 47.8* 28.4* 30.3*  PO2ART 223.0* 113* 174*   Liver Enzymes  Recent Labs Lab 07/04/15 1134 07/06/15 0250 07/08/15 0330  AST 256* 180* 109*  ALT 66* 52 39  ALKPHOS 121 92 84  BILITOT 1.5* 1.5* 1.1  ALBUMIN 3.7 3.0* 2.7*   Cardiac Enzymes No results for input(s): TROPONINI, PROBNP in the last 168 hours.  Glucose  Recent Labs Lab 07/09/15 1122 07/09/15 1610 07/09/15 1920 07/09/15 2324 07/10/15 0347 07/10/15 0823  GLUCAP 75 83 92 127* 139* 122*   Imaging No results found.    STUDIES:  CT Head 3/20 >>  Atrophy. No acute changes. MRI Head (3/20 >> No acute bleed. Intense signal and the globus pallidus maybe related to hepatic encephalopathy.  ECHO 3/22 >> mild hypertrophy, EF 55-60%, normal wall motion, mild MR [place]  ABD 3/24 >> patent middle hepatic to R portal venous TIPS, trace ascites, small R pleural effusion   CULTURES: Blood culture 3/21 >>  Trach culture 3/21 >> MRSA 3/21 >> neg  ANTIBIOTICS: Rocephin (3/21) >>   SIGNIFICANT EVENTS: 3/20  Admitted for AMS after abrupt cessation of ETOH. Treated as hepatic encephalopathy. 3/21  Became somnolent/obtunded. PCCM consulted for hypercapneic resp failure.  3/24  Attempted extubation, promptly failed / reintubated  3/26  Weaning on PSV   LINES/TUBES: ETT 3/21 >> 3/24, 3/24 >>  DISCUSSION: 63 male, known to have liver cirrhosis related to alcohol abuse, history of TIPS for refractory ascites, comes in with 5 day history of altered mental status. Initially treated as hepatic encephalopathy. Sensorium worsened, becoming somnolent developed hypercapnic respiratory failure. Transferred to ICU.  Intubated.  ASSESSMENT / PLAN:  PULMONARY A: Acute hypoxemic hypercapnic respiratory failure -  secondary to inability to protect airway, likely with severe sleep apnea untreated, possible aspiration pneumonitis. Patient also had bit tongue with bleeding / trauma.  DIFFICULT AIRWAY Failed extubation 3/24 PM P:   PSV trials, cuff leak prior to extubation  Daily SBT / WUA Titrate O2 for sat of 88-92% Pulmicort BID and DuoNeb QID Intermittent CXR Decadron for airway swelling   CARDIOVASCULAR A:  Hypotension - off neo 3/23 0300, Echo with EF 55-60%, resolved.  Hx hypertension  P:  Tele monitoring LCB - no CPR  RENAL A:   Acute kidney injury/chronic kidney disease. 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  History of Esophageal cancer for which he had surgery. P:  Observe for further oral bleeding  Monitor CBC, coags SCD's for DVT prophylaxis   INFECTIOUS A:   Concern for aspiration pneumonitis, possible SBP.  PCT negative.   P:   Cultures as above Ceftriaxone will cover for both. Antibiotics are limited because of liver and renal disease D5/x abx   ENDOCRINE A:   No active issues. P:   Check fingerstick every 4-6 hours  NEUROLOGIC A:   Altered mental status likely related to hepatic encephalopathy. There was concern about CVA but MRI was unremarkable for any acute changes. Encephalopathy related to alcohol withdrawal, hypercapnea.  P:   RASS  goal: 0 Daily WUA / SBT PRN fentanyl for pain Versed PRN for sedation     FAMILY  - Updates:  No family at bedside am 3/26.    - Inter-disciplinary family meet or Palliative Care meeting due by:  3/28   Noe Gens, NP-C [facility]monary [facility] Critical [facility] Pgr: [phone] or if no answer [phone] 07/10/2015, 9:03 AM

## 2015-07-11 ENCOUNTER — Inpatient Hospital Stay (HOSPITAL_COMMUNITY): Payer: Medicare HMO

## 2015-07-11 LAB — BASIC METABOLIC PANEL
ANION GAP: 11 (ref 5–15)
BUN: 20 mg/dL (ref 6–20)
CHLORIDE: 111 mmol/L (ref 101–111)
CO2: 19 mmol/L — AB (ref 22–32)
Calcium: 8.8 mg/dL — ABNORMAL LOW (ref 8.9–10.3)
Creatinine, Ser: 1.74 mg/dL — ABNORMAL HIGH (ref 0.61–1.24)
GFR calc Af Amer: 43 mL/min — ABNORMAL LOW (ref >60)
GFR, EST NON AFRICAN AMERICAN: 37 mL/min — AB (ref >60)
GLUCOSE: 121 mg/dL — AB (ref 65–99)
POTASSIUM: 4.3 mmol/L (ref 3.5–5.1)
Sodium: 141 mmol/L (ref 135–145)

## 2015-07-11 LAB — CBC
HEMATOCRIT: 25.6 % — AB (ref 39.0–52.0)
HEMOGLOBIN: 9.3 g/dL — AB (ref 13.0–17.0)
MCH: 36 pg — ABNORMAL HIGH (ref 26.0–34.0)
MCHC: 36.3 g/dL — AB (ref 30.0–36.0)
MCV: 99.2 fL (ref 78.0–100.0)
Platelets: 111 10*3/uL — ABNORMAL LOW (ref 150–400)
RBC: 2.58 MIL/uL — ABNORMAL LOW (ref 4.22–5.81)
RDW: 15.8 % — ABNORMAL HIGH (ref 11.5–15.5)
WBC: 10.5 10*3/uL (ref 4.0–10.5)

## 2015-07-11 LAB — GLUCOSE, CAPILLARY
GLUCOSE-CAPILLARY: 111 mg/dL — AB (ref 65–99)
Glucose-Capillary: 113 mg/dL — ABNORMAL HIGH (ref 65–99)
Glucose-Capillary: 117 mg/dL — ABNORMAL HIGH (ref 65–99)
Glucose-Capillary: 125 mg/dL — ABNORMAL HIGH (ref 65–99)

## 2015-07-11 LAB — CULTURE, BLOOD (ROUTINE X 2): Culture: NO GROWTH

## 2015-07-11 LAB — AMMONIA: AMMONIA: 69 umol/L — AB (ref 9–35)

## 2015-07-11 MED ORDER — DEXMEDETOMIDINE HCL IN NACL 200 MCG/50ML IV SOLN: 0.4000 ug/kg/h | INTRAVENOUS | Status: DC

## 2015-07-11 MED ORDER — DEXMEDETOMIDINE HCL IN NACL 400 MCG/100ML IV SOLN
0.4000 ug/kg/h | INTRAVENOUS | Status: DC
  Filled 2015-07-11: qty 100

## 2015-07-11 MED ORDER — DEXAMETHASONE SODIUM PHOSPHATE 4 MG/ML IJ SOLN
2.0000 mg | Freq: Four times a day (QID) | INTRAMUSCULAR | Status: DC
  Administered 2015-07-11 – 2015-07-12 (×4): 2 mg via INTRAVENOUS
  Filled 2015-07-11 (×4): qty 1

## 2015-07-11 NOTE — Progress Notes (Addendum)
PULMONARY / CRITICAL CARE MEDICINE   Name: [name] MRN: [id number] DOB: [birth year]    ADMISSION DATE:  07/04/2015 CONSULTATION DATE:  07/05/2015  REFERRING MD:  Family Medicine  CHIEF COMPLAINT:  AMS    SUBJECTIVE:   No wean over the weekend. Has a cuff leak. Pt became agitated and not following commands this am. Very tachypneic during PST. Placed on precedex drip.  VITAL SIGNS: BP 143/92 mmHg  Pulse 98  Temp(Src) 98.4 F (36.9 C) (Oral)  Resp 13  Ht 6\' 2"  (1.88 m)  Wt 227 lb 1.2 oz (103 kg)  BMI 29.14 kg/m2  SpO2 99%  HEMODYNAMICS:    VENTILATOR SETTINGS: Vent Mode:  [-] PRVC FiO2 (%):  [40 %] 40 % Set Rate:  [16 bmp] 16 bmp Vt Set:  [620 mL] 620 mL PEEP:  [5 cmH20] 5 cmH20 Pressure Support:  [5 cmH20] 5 cmH20 Plateau Pressure:  [15 cmH20-23 cmH20] 17 cmH20  INTAKE / OUTPUT: I/O last 3 completed shifts: In: 2540 [I.V.:20; NG/GT:2420; IV Piggyback:100] Out: [id number] [Urine:1230]  PHYSICAL EXAMINATION: General:  Chronically ill appearing male on vent in NAD. Agitated and not following commands.  Neuro:  (-) lateralizing signs. Does not follow commands. Agitated.  HEENT:  PERLA, OETT  Cardiovascular:  s1s2 rrr, no m/r/g Lungs:  Labored breathing on PST, lungs bilaterally clear  Abdomen:  Obese/soft, bsx4 active  Musculoskeletal:  No acute deformities, trace pedal edema Skin:  Warm and dry. (-) rash.   LABS:  BMET  Recent Labs Lab 07/09/15 0637 07/10/15 0237 07/11/15 0240  NA 139 139 141  K 3.2* 3.4* 4.3  CL 110 111 111  CO2 19* 16* 19*  BUN 10 11 20   CREATININE 1.44* 1.68* 1.74*  GLUCOSE 80 164* 121*   Electrolytes  Recent Labs Lab 07/05/15 2036  07/07/15 0455  07/09/15 0231 07/09/15 0637 07/10/15 0237 07/11/15 0240  CALCIUM 8.3*  < > 7.5*  < > 7.9* 7.9* 8.2* 8.8*  MG 1.3*  --  1.6*  --  1.8  --   --   --   PHOS 3.5  --  2.1*  --  2.3*  --   --   --   < > = values in this interval not displayed. CBC  Recent Labs Lab 07/09/15 0231  07/10/15 0237 07/11/15 0240  WBC 3.6* 5.4 10.5  HGB 10.0* 9.8* 9.3*  HCT 27.7* 29.0* 25.6*  PLT 81* 105* 111*   Coag's  Recent Labs Lab 07/05/15 2036  APTT 40*  INR 1.10   Sepsis Markers  Recent Labs Lab 07/05/15 2036 07/05/15 2308  LATICACIDVEN 1.9 2.4*  PROCALCITON <0.10  --    ABG  Recent Labs Lab 07/05/15 2137 07/07/15 0410 07/09/15 0445  PHART 7.321* 7.454* 7.400  PCO2ART 47.8* 28.4* 30.3*  PO2ART 223.0* 113* 174*   Liver Enzymes  Recent Labs Lab 07/04/15 1134 07/06/15 0250 07/08/15 0330  AST 256* 180* 109*  ALT 66* 52 39  ALKPHOS 121 92 84  BILITOT 1.5* 1.5* 1.1  ALBUMIN 3.7 3.0* 2.7*   Cardiac Enzymes No results for input(s): TROPONINI, PROBNP in the last 168 hours.  Glucose  Recent Labs Lab 07/10/15 1136 07/10/15 1635 07/10/15 1936 07/10/15 2329 07/11/15 0320 07/11/15 0900  GLUCAP 142* 118* 129* 137* 113* 117*   Imaging Dg Chest Port 1 View  07/11/2015  CLINICAL DATA:  Acute respiratory failure EXAM: PORTABLE CHEST 1 VIEW COMPARISON:  Portable chest x-ray of July 10, 2015 FINDINGS: The lungs are better  inflated today. The retrocardiac region on the left remains dense. A small amount of pleural fluid blunts the left lateral costophrenic angle. The cardiac silhouette remains enlarged. The pulmonary vascularity is normal. The endotracheal tube tip lies 3.4 cm above the carina. The Doppler pop type feeding tube tip projects below the inferior margin of the image. The courses through what appears to be a large hiatal hernia. IMPRESSION: Slight interval improvement in the left lower lobe atelectasis or pneumonia. Improved aeration overall. Stable cardiomegaly without significant pulmonary vascular congestion. Electronically Signed   By: David  Martinique M.D.   On: 07/11/2015 07:13      STUDIES:  CT Head 3/20 >>  Atrophy. No acute changes. MRI Head (3/20 >> No acute bleed. Intense signal and the globus pallidus maybe related to hepatic  encephalopathy.  ECHO 3/22 >> mild hypertrophy, EF 55-60%, normal wall motion, mild MR [place] ABD 3/24 >> patent middle hepatic to R portal venous TIPS, trace ascites, small R pleural effusion   CULTURES: Blood culture 3/21 >> (-) Trach culture 3/21 >> no results seen.  MRSA 3/21 >> neg  ANTIBIOTICS: Rocephin (3/21) >>   SIGNIFICANT EVENTS: 3/20  Admitted for AMS after abrupt cessation of ETOH. Treated as hepatic encephalopathy. 3/21  Became somnolent/obtunded. PCCM consulted for hypercapneic resp failure.  3/24  Attempted extubation, promptly failed / reintubated  3/26  Weaning on PSV  3/27 agitated this am on PST  LINES/TUBES: ETT 3/21 >> 3/24, 3/24 >>  DISCUSSION: 59 male, known to have liver cirrhosis related to alcohol abuse, history of TIPS for refractory ascites, comes in with 5 day history of altered mental status. Initially treated as hepatic encephalopathy. Sensorium worsened, becoming somnolent developed hypercapnic respiratory failure. Transferred to ICU. Intubated.  ASSESSMENT / PLAN:  PULMONARY A: Acute hypoxemic hypercapnic respiratory failure -  secondary to inability to protect airway, likely with severe sleep apnea untreated, possible aspiration pneumonitis. Patient also had bit tongue with bleeding / trauma.  DIFFICULT AIRWAY Failed extubation 3/24 PM >>no cuff leak P:   PSV trials, cuff leak prior to extubation  Daily SBT / WUA Titrate O2 for sat of 88-92% Pulmicort BID and DuoNeb QID Intermittent CXR Decadron for airway swelling>> will dec dose With cuff leak this am 3/27 but pt was agitated and not following commands >> precedex drip.   CARDIOVASCULAR A:  Hypotension - off neo 3/23 0300, Echo with EF 55-60%, resolved.  Hx hypertension  P:  Tele monitoring LCB - no CPR  RENAL A:   Acute kidney injury/chronic kidney disease. 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  History of Esophageal cancer for which he had surgery. P:  Observe for further oral bleeding  Monitor CBC, coags SCD's for DVT prophylaxis   INFECTIOUS A:   Concern for aspiration pneumonitis, possible SBP.  PCT negative.   P:   Cultures as above (D6) >> plan for 7 days Ceftriaxone will cover for both. Antibiotics are limited because of liver and renal  disease D5/x abx   ENDOCRINE A:   No active issues. P:   Check fingerstick every 4-6 hours  NEUROLOGIC A:   Altered mental status likely related to hepatic encephalopathy. There was concern about CVA but MRI was unremarkable for any acute changes. Encephalopathy related to alcohol withdrawal, hypercapnea.  Pt was agitated on 3/27 -- tachpneic and not following commands.  P:   RASS goal: 0 Daily WUA / SBT PRN fentanyl for pain Versed PRN for sedation  Will try precedex and observe mental status  I spent 34 minutes of critical Care time with this patient today.   Addendum 3/27 1130am : pt's sensorium significantly improved. Doing well on PST. Good TV. Follows commands. Will extubate to bipap 12/5 x 2 hrs then wean off bipap. Needs bipap at HS for OSA. Wife updated.   FAMILY  - Updates:  No family at bedside am 3/27.   - Inter-disciplinary family meet or Palliative Care meeting due by:  3/28  J. Shirl Harris, MD 07/11/2015, 9:29 AM [facility] Pager [phone] After 3 pm or if no answer, call [phone]

## 2015-07-11 NOTE — Procedures (Signed)
Extubation Procedure Note  Patient Details:   Name: [name] DOB: Nov 11, 1945 MRN: [id number]   Airway Documentation:     Evaluation  O2 sats: stable throughout Complications: No apparent complications Patient did tolerate procedure well. Bilateral Breath Sounds: Rhonchi, Diminished   Yes   Pt extubated to NIV PC/Bipap 10/5 R 8 40% per MD Order. Pt is to wear x 1-2 hrs, then QHS with sleep. Pt able to speak and has a weak but productive cough post extubation. Pt stable throughout with no complications. Pt with clear/dim BS bilaterally. RT will continue to monitor.   Jesse Sans 07/11/2015, 12:20 PM

## 2015-07-11 NOTE — Progress Notes (Signed)
Pt extubated to Bipap at this time, 40%

## 2015-07-11 NOTE — Progress Notes (Signed)
Pt on 5/5 wean this am and Rt was called to Pt's room by RN. Pt with very low exhaled VT and MV, waveforms very erratic and pt looked in distress. It appeared pt was not able to exhale on ventilator. Pt suctioned, Taken off ventilator with normal inhalation/exhalation noted through ett, placed back on FS with no improvement. MD at bedside. RT switched ventilator out for a new one with immediate changes back to baseline. Ventilator given to bio-med for inspection. Pt remains on FS with no complications. RT will continue to monitor.

## 2015-07-12 DIAGNOSIS — J9602 Acute respiratory failure with hypercapnia: Secondary | ICD-10-CM

## 2015-07-12 DIAGNOSIS — F10939 Alcohol use, unspecified with withdrawal, unspecified: Secondary | ICD-10-CM | POA: Diagnosis present

## 2015-07-12 DIAGNOSIS — J9601 Acute respiratory failure with hypoxia: Secondary | ICD-10-CM

## 2015-07-12 DIAGNOSIS — K7031 Alcoholic cirrhosis of liver with ascites: Secondary | ICD-10-CM

## 2015-07-12 DIAGNOSIS — F10239 Alcohol dependence with withdrawal, unspecified: Secondary | ICD-10-CM | POA: Diagnosis present

## 2015-07-12 MED ORDER — DEXAMETHASONE SODIUM PHOSPHATE 4 MG/ML IJ SOLN
2.0000 mg | Freq: Two times a day (BID) | INTRAMUSCULAR | Status: AC
Start: 2015-07-12 — End: 2015-07-13
  Administered 2015-07-12 – 2015-07-13 (×2): 2 mg via INTRAVENOUS
  Filled 2015-07-12 (×2): qty 1

## 2015-07-12 MED ORDER — DEXTROSE 5 % IV SOLN
2.0000 g | Freq: Once | INTRAVENOUS | Status: AC
  Administered 2015-07-12: 2 g via INTRAVENOUS
  Filled 2015-07-12: qty 2

## 2015-07-12 MED ORDER — PANTOPRAZOLE SODIUM 40 MG PO PACK
40.0000 mg | PACK | Freq: Every day | ORAL | Status: DC
  Administered 2015-07-13 – 2015-07-15 (×3): 40 mg
  Filled 2015-07-12 (×3): qty 20

## 2015-07-12 MED ORDER — FOLIC ACID 1 MG PO TABS
1.0000 mg | ORAL_TABLET | Freq: Every day | ORAL | Status: DC
  Administered 2015-07-13 – 2015-07-22 (×10): 1 mg via ORAL
  Filled 2015-07-12 (×10): qty 1

## 2015-07-12 MED ORDER — CEFTRIAXONE SODIUM 2 G IJ SOLR
2.0000 g | INTRAMUSCULAR | Status: DC
  Filled 2015-07-12: qty 2

## 2015-07-12 NOTE — Care Management Important Message (Signed)
Important Message  Patient Details  Name: [name] MRN: [id number] Date of Birth: 12-12-1945   Medicare Important Message Given:  Yes    Barb Merino [place] 07/12/2015, 12:47 PM

## 2015-07-12 NOTE — Progress Notes (Signed)
RT placed patient on BIPAP HS. Patient is tolerating well. 3L O2 bleed in. RT will continue to monitor as needed.

## 2015-07-12 NOTE — Progress Notes (Signed)
RT placed patient on BIPAP HS. Patient is tolerating well at this time. RT will continue to monitor as needed.

## 2015-07-12 NOTE — Progress Notes (Signed)
PULMONARY / CRITICAL CARE MEDICINE   Name: [name] MRN: [id number] DOB: 1945/05/26    ADMISSION DATE:  07/04/2015 CONSULTATION DATE:  07/05/2015  REFERRING MD:  Family Medicine  CHIEF COMPLAINT:  AMS    SUBJECTIVE:   Extubated and doing well on [place]. On and off confusion.  Advancing diet.   VITAL SIGNS: BP 130/81 mmHg  Pulse 74  Temp(Src) 97.6 F (36.4 C) (Oral)  Resp 13  Ht 6\' 2"  (1.88 m)  Wt 223 lb 15.8 oz (101.6 kg)  BMI 28.75 kg/m2  SpO2 98%  HEMODYNAMICS:    VENTILATOR SETTINGS: Vent Mode:  [-] BIPAP FiO2 (%):  [40 %] 40 % Set Rate:  [8 bmp] 8 bmp PEEP:  [5 cmH20] 5 cmH20  INTAKE / OUTPUT: I/O last 3 completed shifts: In: [id number] [P.O.:350; I.V.:90; NG/GT:1095; IV Piggyback:100] Out: [id number] [Urine:1675]  PHYSICAL EXAMINATION: General:  Chronically ill appearing male on vent in NAD. Agitated and not following commands.  Neuro:  (-) lateralizing signs. Does not follow commands. Agitated.  HEENT:  PERLA, OETT  Cardiovascular:  s1s2 rrr, no m/r/g Lungs:  Labored breathing on PST, lungs bilaterally clear  Abdomen:  Obese/soft, bsx4 active  Musculoskeletal:  No acute deformities, trace pedal edema Skin:  Warm and dry. (-) rash.   LABS:  BMET  Recent Labs Lab 07/09/15 0637 07/10/15 0237 07/11/15 0240  NA 139 139 141  K 3.2* 3.4* 4.3  CL 110 111 111  CO2 19* 16* 19*  BUN 10 11 20   CREATININE 1.44* 1.68* 1.74*  GLUCOSE 80 164* 121*   Electrolytes  Recent Labs Lab 07/05/15 2036  07/07/15 0455  07/09/15 0231 07/09/15 0637 07/10/15 0237 07/11/15 0240  CALCIUM 8.3*  < > 7.5*  < > 7.9* 7.9* 8.2* 8.8*  MG 1.3*  --  1.6*  --  1.8  --   --   --   PHOS 3.5  --  2.1*  --  2.3*  --   --   --   < > = values in this interval not displayed. CBC  Recent Labs Lab 07/09/15 0231 07/10/15 0237 07/11/15 0240  WBC 3.6* 5.4 10.5  HGB 10.0* 9.8* 9.3*  HCT 27.7* 29.0* 25.6*  PLT 81* 105* 111*   Coag's  Recent Labs Lab 07/05/15 2036  APTT 40*  INR  1.10   Sepsis Markers  Recent Labs Lab 07/05/15 2036 07/05/15 2308  LATICACIDVEN 1.9 2.4*  PROCALCITON <0.10  --    ABG  Recent Labs Lab 07/05/15 2137 07/07/15 0410 07/09/15 0445  PHART 7.321* 7.454* 7.400  PCO2ART 47.8* 28.4* 30.3*  PO2ART 223.0* 113* 174*   Liver Enzymes  Recent Labs Lab 07/06/15 0250 07/08/15 0330  AST 180* 109*  ALT 52 39  ALKPHOS 92 84  BILITOT 1.5* 1.1  ALBUMIN 3.0* 2.7*   Cardiac Enzymes No results for input(s): TROPONINI, PROBNP in the last 168 hours.  Glucose  Recent Labs Lab 07/10/15 1936 07/10/15 2329 07/11/15 0320 07/11/15 0900 07/11/15 1201 07/11/15 1705  GLUCAP 129* 137* 113* 117* 125* 111*   Imaging No results found.    STUDIES:  CT Head 3/20 >>  Atrophy. No acute changes. MRI Head (3/20 >> No acute bleed. Intense signal and the globus pallidus maybe related to hepatic encephalopathy.  ECHO 3/22 >> mild hypertrophy, EF 55-60%, normal wall motion, mild MR [place] ABD 3/24 >> patent middle hepatic to R portal venous TIPS, trace ascites, small R pleural effusion   CULTURES: Blood culture 3/21 >> (-)  Trach culture 3/21 >> no results seen.  MRSA 3/21 >> neg  ANTIBIOTICS: Rocephin (3/21) >>   SIGNIFICANT EVENTS: 3/20  Admitted for AMS after abrupt cessation of ETOH. Treated as hepatic encephalopathy. 3/21  Became somnolent/obtunded. PCCM consulted for hypercapneic resp failure.  3/24  Attempted extubation, promptly failed / reintubated  3/26  Weaning on PSV  3/27 extubated and doing well.   LINES/TUBES: ETT 3/21 >> 3/24, 3/24 >>  DISCUSSION: 24 male, known to have liver cirrhosis related to alcohol abuse, history of TIPS for refractory ascites, comes in with 5 day history of altered mental status. Initially treated as hepatic encephalopathy. Sensorium worsened, becoming somnolent developed hypercapnic respiratory failure. Transferred to ICU. Intubated.  ASSESSMENT / PLAN:  PULMONARY A: Acute hypoxemic hypercapnic  respiratory failure -  secondary to inability to protect airway, likely with severe sleep apnea untreated, possible aspiration pneumonitis. Patient also had bit tongue with bleeding / trauma.  DIFFICULT AIRWAY Failed extubation 3/24 PM >>no cuff leak Extubated again on 3/27 >> doing well. P:   BiPaP at HS for OSA and prn.  When he goes home, he needs to f/u with Pulmonary re: getting a new cpap.bipap for untreated OSA.  Pulmicort BID and DuoNeb QID Wean off decadron.  Will d/c rocephin after today.   CARDIOVASCULAR A:  Hypotension - off neo 3/23 0300, Echo with EF 55-60%, resolved.  Hx hypertension  P:  Tele monitoring LCB - no CPR Observe BP  RENAL A:   Acute kidney injury/chronic kidney disease. Hypokalemia Hypomagnesemia  Hyponatremia  P:   Trend BMP, Mg, Phos Replace electrolytes as indicated  GASTROINTESTINAL A:   Hepatic encephalopathy >> better.  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  Will d/c rocephin after 7 days  HEMATOLOGIC A:   Oral Bleeding - likely related to patient biting his lip/tongue Thrombocytopenia, elevated INR, related to liver cirrhosis, CKD.  History of Esophageal cancer for which he had surgery. P:  Observe for further oral bleeding  Monitor CBC, coags SCD's for DVT prophylaxis   INFECTIOUS A:   Concern for aspiration pneumonitis, possible SBP.  PCT negative.   P:   Cultures as above (D6) >> plan for 7 days Ceftriaxone will cover for both. Antibiotics are limited because of liver and renal disease D7 rocephin today >> will d/c after today.   ENDOCRINE A:   No active issues. P:   Check fingerstick every 4-6 hours  NEUROLOGIC A:   Altered mental status likely related to hepatic  encephalopathy. There was concern about CVA but MRI was unremarkable for any acute changes. Encephalopathy related to alcohol withdrawal, hypercapnea.  Pt was agitated on 3/27 -- tachpneic and not following commands.  Over all sensorium is better. On and off confusion.  P:   PRN fentanyl for pain   Spoke to family medicine re: pts case. Pt to go to SDU. Family med to see pt in am.  PCCM will sign off in am. Bipap for osa.  He will need to follow with pulm as outpt to have cpap/bipap ordered again. Will need a new sleep study.  Call back if with questions.    FAMILY  - Updates:  Family updated at bedside  J. Shirl Harris, MD 07/12/2015, 11:11 AM  [facility] Pager [phone] After 3 pm or if no answer, call [phone]

## 2015-07-13 DIAGNOSIS — G9340 Encephalopathy, unspecified: Secondary | ICD-10-CM | POA: Diagnosis present

## 2015-07-13 LAB — CBC
HEMATOCRIT: 24.9 % — AB (ref 39.0–52.0)
Hemoglobin: 8.8 g/dL — ABNORMAL LOW (ref 13.0–17.0)
MCH: 35.2 pg — ABNORMAL HIGH (ref 26.0–34.0)
MCHC: 35.3 g/dL (ref 30.0–36.0)
MCV: 99.6 fL (ref 78.0–100.0)
PLATELETS: 106 10*3/uL — AB (ref 150–400)
RBC: 2.5 MIL/uL — ABNORMAL LOW (ref 4.22–5.81)
RDW: 16 % — AB (ref 11.5–15.5)
WBC: 7.1 10*3/uL (ref 4.0–10.5)

## 2015-07-13 LAB — COMPREHENSIVE METABOLIC PANEL
ALBUMIN: 3 g/dL — AB (ref 3.5–5.0)
ALT: 32 U/L (ref 17–63)
ANION GAP: 10 (ref 5–15)
AST: 48 U/L — AB (ref 15–41)
Alkaline Phosphatase: 98 U/L (ref 38–126)
BILIRUBIN TOTAL: 0.9 mg/dL (ref 0.3–1.2)
BUN: 27 mg/dL — ABNORMAL HIGH (ref 6–20)
CALCIUM: 8.9 mg/dL (ref 8.9–10.3)
CO2: 21 mmol/L — ABNORMAL LOW (ref 22–32)
Chloride: 107 mmol/L (ref 101–111)
Creatinine, Ser: 1.87 mg/dL — ABNORMAL HIGH (ref 0.61–1.24)
GFR calc non Af Amer: 34 mL/min — ABNORMAL LOW (ref >60)
GFR, EST AFRICAN AMERICAN: 40 mL/min — AB (ref >60)
GLUCOSE: 108 mg/dL — AB (ref 65–99)
Potassium: 4.1 mmol/L (ref 3.5–5.1)
SODIUM: 138 mmol/L (ref 135–145)
TOTAL PROTEIN: 6.2 g/dL — AB (ref 6.5–8.1)

## 2015-07-13 MED ORDER — ATENOLOL 25 MG PO TABS
25.0000 mg | ORAL_TABLET | Freq: Every day | ORAL | Status: DC
  Administered 2015-07-14 – 2015-07-16 (×3): 25 mg via ORAL
  Filled 2015-07-13 (×4): qty 1

## 2015-07-13 MED ORDER — HEPARIN SODIUM (PORCINE) 1000 UNIT/ML IJ SOLN
5000.0000 [IU] | Freq: Three times a day (TID) | INTRAMUSCULAR | Status: DC
  Filled 2015-07-13: qty 5

## 2015-07-13 MED ORDER — CHLORHEXIDINE GLUCONATE 0.12 % MT SOLN
OROMUCOSAL | Status: AC
  Filled 2015-07-13: qty 15

## 2015-07-13 MED ORDER — HEPARIN SODIUM (PORCINE) 5000 UNIT/ML IJ SOLN
5000.0000 [IU] | Freq: Three times a day (TID) | INTRAMUSCULAR | Status: DC
  Administered 2015-07-13 – 2015-07-18 (×15): 5000 [IU] via SUBCUTANEOUS
  Filled 2015-07-13 (×15): qty 1

## 2015-07-13 NOTE — Progress Notes (Signed)
Family Medicine Teaching Service Daily Progress Note Intern Pager: [phone]  Patient name: [name] Medical record number: [id number] Date of birth: [birth year] Age: 70 y.o. Gender: male  Primary Care Provider: Chesley Noon, MD Consultants: GI, CCM Code Status: Full  Pt Overview and Major Events to Date:  3/20 Admitted for AMS after abrupt cessation of ETOH. Treated as hepatic encephalopathy. 3/21 Became somnolent/obtunded. PCCM consulted for hypercapneic resp failure. Started on rocephin.  3/24 Attempted extubation, promptly failed / reintubated; GI signed off  3/26 Weaning on PSV  3/27  Extubated and doing well.  3/28  Rocephin stopped  Assessment and Plan: [name] is a 70 y.o. male presenting with worsening encephalopathy. 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 He required intubation from 3/21-3/27 thought to be due to severe sleep apnea.   Encephalopathy: History of alcoholic cirrhosis on lactulose with decreased frequency of bowel movements on initial presentation. He has also been being monitored on CIWA protocol for continued alcohol abuse at home and hallucinations reported by wife the night prior to admission. Wife initially had concern for stroke with onset of declining status 4 weeks ago. CT head negative for acute processes. Afebrile and no leukocytosis. Has daytime snoring while awake and required intubation for hypercapneic respiratory failure, making severe sleep apnea a likely cause of his confusion. - Admitted to telemetry with continuous cardiopulmonary monitoring - Receiving nightly BiPAP - MRI/MRA head without evidence of stroke but with significant narrowing of cerebral and cerebellar arteries, hyperintensity of globus pallidus c/w hepatic encephalopathy - RPR nonreactive.  - CIWA protocol ordered -->  scores 0; Consider discontinuing - [place] ABD 3/24 showed patent middle hepatic to R portal venous TIPS, trace ascites, small R pleural effusion  - SLP repeat revaluation ordered - PT consult ordered  EtOH cirrhosis: EtOH < 5, AST 256, ALT 66, Ammonia 96  - GI consulted, appreciate recommendations. Lactulose increased from 15 mg TID to 20 mg TID.  - Restart home atenolol but at decreased dose of 25 mg given HR in 60s   AKI: Baseline ~ 1.2-1.3, at 1.87 today, 07/13/15 - Took good PO yesterday (1440) - Consider restarting IVFs if not improving  Snoring at rest: Improved. Did receive decadron yesterday, 3/28. Likely severe sleep apnea - Will need sleep study.  - Receiving BiPAP nightly - SLP consulted for repeat swallow evaluation  Penile ulcer: Does not look infected. Scab nearly gone. No leukocytosis.  - RPR nonreactive - Gc/chlamydia negative  S/p esophagectomy: - Protonix ordered as substitute for home prilosec.   FEN/GI: Heart healthy diet; d/c NG tube Prophylaxis: heparin, protonix  Disposition: Pending PT recs and ability to schedule sleep study   Subjective:  Patient feeling well this morning. He was able to hold on a conversation but is limited by his hearing impairment. His wife thinks he is doing better but has wandering thoughts.   Objective: Temp:  [97.6 F (36.4 C)-98.6 F (37 C)] 98.3 F (36.8 C) (03/29 0000) Pulse Rate:  [66-88] 73 (03/29 0600) Resp:  [9-18] 17 (03/29 0600) BP: (113-132)/(68-88) 132/88 mmHg (03/29 0600) SpO2:  [91 %-99 %] 98 % (03/29 0600) Physical Exam: General: Chronically ill appearing man, alert, sitting up in bed  Eyes: PERRL, EOMI, no ptosis ENTM: Mallampati Class 4, MMM; NG tube in place Cardiovascular: RRR, S1, S2, no m/r/g Chest: Moving good air throughout all lung fields. Speaking in complete sentences.  Abdomen: +BS, S, NT, distended and hyper-resonnant  Neuro: AOx2 (person, month and year; thought we were in [place] area); CNII-XII  grossly intact. GU: Wound on glans well healed and scab nearly gone.   Laboratory:  Recent Labs Lab 07/10/15 0237 07/11/15 0240 07/13/15 0420  WBC 5.4 10.5 7.1  HGB 9.8* 9.3* 8.8*  HCT 29.0* 25.6* 24.9*  PLT 105* 111* 106*    Recent Labs Lab 07/08/15 0330  07/10/15 0237 07/11/15 0240 07/13/15 0420  NA 137  < > 139 141 138  K 2.9*  < > 3.4* 4.3 4.1  CL 108  < > 111 111 107  CO2 20*  < > 16* 19* 21*  BUN 13  < > 11 20 27*  CREATININE 1.60*  < > 1.68* 1.74* 1.87*  CALCIUM 7.7*  < > 8.2* 8.8* 8.9  PROT 5.4*  --   --   --  6.2*  BILITOT 1.1  --   --   --  0.9  ALKPHOS 84  --   --   --  98  ALT 39  --   --   --  32  AST 109*  --   --   --  48*  GLUCOSE 80  < > 164* 121* 108*  < > = values in this interval not displayed.  Imaging/Diagnostic Tests: Dg Chest 2 View  07/04/2015  CLINICAL DATA:  Altered mental status EXAM: CHEST  2 VIEW COMPARISON:  11/10/2009 FINDINGS: Cardiac enlargement. Pulmonary vascular congestion without edema. Hypoventilation with bibasilar atelectasis. IMPRESSION: Cardiac enlargement with pulmonary vascular congestion. Negative for edema Bibasilar atelectasis Electronically Signed   By: Franchot Gallo M.D.   On: 07/04/2015 12:51   Ct Head Wo Contrast  07/04/2015  IMPRESSION: Atrophy and chronic white matter changes.  No acute abnormality. Electronically Signed   By: Franchot Gallo M.D.   On: 07/04/2015 13:00   Mr Jodene Nam Head Wo Contrast  07/04/2015  IMPRESSION: MRI HEAD Exam is motion degraded. No acute infarct or intracranial hemorrhage. T1 increased hyperintensity globus pallidus. This has been described in patients with hepatic encephalopathy. Mild chronic small vessel disease changes. Global atrophy without hydrocephalus. No intracranial mass lesion noted on this unenhanced exam. Spinal stenosis C3-4 suspected although incompletely assessed secondary to motion. Degenerative changes C1-2. MRA HEAD Exam is motion degraded. Right vertebral artery is occluded.  Mild to moderate narrowing proximal basilar artery. Nonvisualized posterior inferior cerebellar arteries and left anterior inferior cerebellar artery with only small portion of the right anterior inferior cerebellar artery visualized. Small superior cerebellar arteries bilaterally. Narrowing distal posterior cerebral artery branches bilaterally. Artifact pre cavernous segment internal carotid artery greater on left limits evaluation for detection of stenosis in this region. Mild irregularity cavernous segment internal carotid artery bilaterally. Poor delineation of a majority of the middle cerebral artery branches bilaterally which may be narrowed. Electronically Signed   By: Genia Del M.D.   On: 07/04/2015 18:10   Mr Brain Wo Contrast  07/04/2015  IMPRESSION: MRI HEAD Exam is motion degraded. No acute infarct or intracranial hemorrhage. T1 increased hyperintensity globus pallidus. This has been described in patients with hepatic encephalopathy. Mild chronic small vessel disease changes. Global atrophy without hydrocephalus. No intracranial mass lesion noted on this unenhanced exam. Spinal stenosis C3-4 suspected although incompletely assessed secondary to motion. Degenerative changes C1-2. MRA HEAD Exam is motion degraded. Right vertebral artery is occluded. Mild to moderate narrowing proximal basilar artery. Nonvisualized posterior inferior cerebellar arteries and left anterior inferior  cerebellar artery with only small portion of the right anterior inferior cerebellar artery visualized. Small superior cerebellar arteries bilaterally. Narrowing distal posterior cerebral artery branches bilaterally. Artifact pre cavernous segment internal carotid artery greater on left limits evaluation for detection of stenosis in this region. Mild irregularity cavernous segment internal carotid artery bilaterally. Poor delineation of a majority of the middle cerebral artery branches bilaterally which may be narrowed.  Electronically Signed   By: Genia Del M.D.   On: 07/04/2015 18:10   [place] Art/ven Flow Abd Pelv Doppler  07/09/2015  IMPRESSION: 1. Patent middle hepatic to right portal venous TIPS. No evidence of stenosis or occlusion. 2. The right and left hepatic veins are not well seen on this study. 3. Trace ascites and small right pleural effusion. Signed, Criselda Peaches, MD [facility] Rad[facility] Electronically Signed   By: Jacqulynn Cadet M.D.   On: 07/09/2015 09:21   Dg Chest Port 1 View  07/11/2015  IMPRESSION: Slight interval improvement in the left lower lobe atelectasis or pneumonia. Improved aeration overall. Stable cardiomegaly without significant pulmonary vascular congestion. Electronically Signed   By: David  Martinique M.D.   On: 07/11/2015 07:13   Dg Chest Port 1 View  07/10/2015  IMPRESSION: Stable support apparatus. Mild elevation of the right hemidiaphragm. Persistent left basilar atelectasis. No pulmonary edema. Electronically Signed   By: Lahoma Crocker M.D.   On: 07/10/2015 09:54   Dg Chest Port 1 View  07/09/2015  IMPRESSION: Worsening aeration. ET tube slightly low. This should be withdrawn approximately 3 cm. Electronically Signed   By: Staci Righter M.D.   On: 07/09/2015 10:03   Dg Chest Port 1 View  07/08/2015  IMPRESSION: Endotracheal tube in place. NG tube with tip within distal stomach. Mild tortuous NG tube within hiatal hernia. No pneumothorax. Mild basilar atelectasis. No segmental infiltrate. Electronically Signed   By: Lahoma Crocker M.D.   On: 07/08/2015 17:34   Dg Chest Port 1 View  07/08/2015  IMPRESSION: 1. Endotracheal tube tip projects approximately 2 cm above the carina. 2. Stable dense left lower lobe atelectasis and/or pneumonia. 3. Stable cardiomegaly without pulmonary edema. 4. No new abnormalities. Electronically Signed   By: Evangeline Dakin M.D.   On: 07/08/2015 07:26   Dg Chest Port 1 View  07/07/2015  IMPRESSION:  1. Endotracheal tube in stable position. 2.  Stable cardiomegaly. 3. Low lung volumes with bibasilar atelectasis and/or infiltrates. Electronically Signed   By: Marcello Moores  Register   On: 07/07/2015 07:27   Portable Chest Xray  07/06/2015  CLINICAL DATA:  IMPRESSION: Slight interval increase in bibasilar atelectasis or pneumonia. The study is limited due to patient positioning. Electronically Signed   By: David  Martinique M.D.   On: 07/06/2015 07:16   Dg Chest Port 1 View  07/05/2015  IMPRESSION: Endotracheal tube tip measuring 2.5 cm above the carina. Cardiac enlargement with mild vascular congestion. No edema or consolidation. Electronically Signed   By: Lucienne Capers M.D.   On: 07/05/2015 20:16   Dg Abd 2 Views  07/04/2015  IMPRESSION: No acute abnormality is noted.  Scattered fecal material is seen. Electronically Signed   By: Inez Catalina M.D.   On: 07/04/2015 18:31   Dg Loyce Dys Tube Plc W/fl W/rad  07/08/2015  IMPRESSION: Fluoroscopically guided placement of a nasoenteric feeding tube with its tip in the 4th portion of the duodenum. The feeding tube is ready for immediate use. Electronically Signed   By: Marcello Moores  Lawrence M.D.   On: 07/08/2015 10:55   Bonita Brindisi Corinda Gubler, MD 07/13/2015, 7:26 AM PGY-1, [place] Intern pager: [phone], text pages welcome

## 2015-07-13 NOTE — Progress Notes (Signed)
Nutrition Follow-up  INTERVENTION:   Magic cup TID with meals, each supplement provides 290 kcal and 9 grams of protein  NUTRITION DIAGNOSIS:   Increased nutrient needs related to chronic illness as evidenced by estimated needs. Ongoing.   GOAL:   Patient will meet greater than or equal to 90% of their needs Progressing.   MONITOR:   PO intake, Supplement acceptance, Labs, Weight trends  ASSESSMENT:   70 y/o male with PMH alcoholic liver cirrhosis s/p TIPS procedure 01/2015 for refractory ascites, Esophogeal Ca s/p total esophagectomy (2002), HTN, and Gout.  Presents with worsening encephalopathy/AMS. Wife noted increased hallucinations, altered mental status, and unsteadiness while ambulating over the past few days.  Reports decreased appetite but says he has been drinking a lot of water over the last few days.  Patients last alcoholic drink was 0000000.   Spoke with pt and wife at bedside. Pt feels he is eating well, never really eats breakfast at home. Does not like ensure but willing to try magic cup.  3/27 extubated Meal Completion: 50-100% 3/29 Cortrak tube removed  Medications reviewed and include: thiamine, folic acid, MVI CBG's: 111-125  Diet Order:  Diet Heart Room service appropriate?: Yes; Fluid consistency:: Thin  Skin:  Wound (see comment) (MASD groin)  Last BM:  3/28  Height:   Ht Readings from Last 1 Encounters:  07/08/15 6\' 2"  (1.88 m)    Weight:   Wt Readings from Last 1 Encounters:  07/12/15 223 lb 15.8 oz (101.6 kg)    Ideal Body Weight:  86.4 kg  BMI:  Body mass index is 28.75 kg/(m^2).  Estimated Nutritional Needs:   Kcal:  2200-2400  Protein:  120-135 grams  Fluid:  > 1.5 L/day  EDUCATION NEEDS:   No education needs identified at this time  [place], [place], [place] Pager [phone] After Hours Pager

## 2015-07-13 NOTE — Progress Notes (Signed)
Spoke w pt and wife. Pt w intermit confusion. Very deconditioned. Awaits phy there eval. Wife feels will need some sort on inpt rehab cir vs snf dep on phy there rec prior to her taking pt home. Will cont to follow.

## 2015-07-13 NOTE — Progress Notes (Signed)
Discussed with pt the need to urinate since urinary foley removed at 1600 per day shift nurse report. Pt verbalized understanding. Bladder scan performed only 200 ml of urine present per scan. Placed urinal between patient's legs and encouraged him to void.

## 2015-07-13 NOTE — Progress Notes (Signed)
Pt called this nurse to the room, reports that he feels wet. No urine observed in urinal. Groin and perineal area cleaned. Dampness present on bed pad. Bed linens changed.

## 2015-07-13 NOTE — Evaluation (Signed)
Clinical/Bedside Swallow Evaluation Patient Details  Name: Patty Tham MRN: [id number] Date of Birth: 1946/01/22  Today's Date: 07/13/2015 Time: SLP Start Time (ACUTE ONLY): 4 SLP Stop Time (ACUTE ONLY): 1600 SLP Time Calculation (min) (ACUTE ONLY): 20 min  Past Medical History:  Past Medical History  Diagnosis Date  . Hypertension     under control  . History of cancer chemotherapy   . Hx of radiation therapy 2002  . Gout     under control  . Umbilical hernia   . Fatty liver     "under control, being monitored"  . Esophageal cancer ([place]) 2002    esophagus removed  . Shortness of breath dyspnea   . GERD (gastroesophageal reflux disease)   . Alcoholic cirrhosis ([place])    Past Surgical History:  Past Surgical History  Procedure Laterality Date  . Esophagus surgery  2002  . Incisional hernia repair  2004  . Tonsillectomy  as child  . Umbilical hernia repair N/A 06/14/2014    Procedure:  OPEN UMBILICAL HERNIA REPAIR;  Surgeon: Pedro Earls, MD;  Location: [facility];  Service: General;  Laterality: N/A;  . Tonsillectomy    . Radiology with anesthesia N/A 02/09/2015    Procedure: TIPS;  Surgeon: Jacqulynn Cadet, MD;  Location: [facility];  Service: Radiology;  Laterality: N/A;   HPI:  70 y/o male with PMH alcoholic liver cirrhosis s/p TIPS, history of Esophogeal SCC s/p total esophagectomy, HTN, and Gout presents with worsening encephalopathy/AMS. His wife has noted increased hallucinations, altered mental status, and unsteadiness while ambulating over the past few days. CXR shows bibasilar atelectasis. MRI shows no acute infarct or intracranial hemorrhage.   Assessment / Plan / Recommendation Clinical Impression   Patient was able to elicit a strong cough. Pt had no s/s of overt aspiration with test consistencies. He shared that he has learned he has OSA and will be continuing usage of a CPAP at home. Recommend that patient continue a regular consistency diet with thin liquids.  Speech therapy will follow up to review aspiration precautions and diet tolerance.     Aspiration Risk  Mild aspiration risk    Diet Recommendation Regular;Thin liquid   Liquid Administration via: Cup;Straw Medication Administration: Whole meds with liquid Supervision: Patient able to self feed;Intermittent supervision to cue for compensatory strategies Compensations: Minimize environmental distractions;Slow rate;Small sips/bites;Follow solids with liquid Postural Changes: Seated upright at 90 degrees    Other  Recommendations Oral Care Recommendations: Oral care BID   Follow up Recommendations  None    Frequency and Duration min 2x/week  1 week       Prognosis Prognosis for Safe Diet Advancement: Good      Swallow Study   General Date of Onset: 07/04/15 HPI: 70 y/o male with PMH alcoholic liver cirrhosis s/p TIPS, history of Esophogeal SCC s/p total esophagectomy, HTN, and Gout presents with worsening encephalopathy/AMS. His wife has noted increased hallucinations, altered mental status, and unsteadiness while ambulating over the past few days. CXR shows bibasilar atelectasis. MRI shows no acute infarct or intracranial hemorrhage. Type of Study: Bedside Swallow Evaluation Previous Swallow Assessment: none in chart Diet Prior to this Study: Regular;Thin liquids Temperature Spikes Noted: No Respiratory Status: Nasal cannula History of Recent Intubation: Yes Length of Intubations (days): 3 days Date extubated: 07/11/15 Behavior/Cognition: Alert;Cooperative;Pleasant mood Oral Cavity Assessment: Within Functional Limits Oral Care Completed by SLP: No Oral Cavity - Dentition: Adequate natural dentition Vision: Functional for self-feeding Self-Feeding Abilities: Able to feed self Patient  Positioning: Upright in chair Baseline Vocal Quality: Normal Volitional Cough: Strong Volitional Swallow: Able to elicit    Oral/Motor/Sensory Function Overall Oral Motor/Sensory Function:  Within functional limits   Ice Chips Ice chips: Within functional limits Presentation: Cup;Spoon   Thin Liquid Thin Liquid: Within functional limits Presentation: Cup;Straw    Nectar Thick Nectar Thick Liquid: Not tested   Honey Thick Honey Thick Liquid: Not tested   Puree Puree: Within functional limits Presentation: Self Fed;Spoon   Solid   GO   Solid: Within functional limits Presentation: Self Fed    Functional Assessment Tool Used: skilled clinical judgment Functional Limitations: Swallowing Swallow Current Status [id number]): At least 1 percent but less than 20 percent impaired, limited or restricted Swallow Goal Status [id number]): 0 percent impaired, limited or restricted    [place], MA, CCC-SLP 07/13/2015 4:04 PM

## 2015-07-14 LAB — CBC
HCT: 29.3 % — ABNORMAL LOW (ref 39.0–52.0)
Hemoglobin: 9.9 g/dL — ABNORMAL LOW (ref 13.0–17.0)
MCH: 33.2 pg (ref 26.0–34.0)
MCHC: 33.8 g/dL (ref 30.0–36.0)
MCV: 98.3 fL (ref 78.0–100.0)
PLATELETS: 110 10*3/uL — AB (ref 150–400)
RBC: 2.98 MIL/uL — ABNORMAL LOW (ref 4.22–5.81)
RDW: 15.1 % (ref 11.5–15.5)
WBC: 7.3 10*3/uL (ref 4.0–10.5)

## 2015-07-14 LAB — BASIC METABOLIC PANEL
ANION GAP: 10 (ref 5–15)
BUN: 24 mg/dL — AB (ref 6–20)
CALCIUM: 9.4 mg/dL (ref 8.9–10.3)
CO2: 25 mmol/L (ref 22–32)
CREATININE: 1.6 mg/dL — AB (ref 0.61–1.24)
Chloride: 101 mmol/L (ref 101–111)
GFR calc Af Amer: 48 mL/min — ABNORMAL LOW (ref >60)
GFR, EST NON AFRICAN AMERICAN: 41 mL/min — AB (ref >60)
GLUCOSE: 78 mg/dL (ref 65–99)
Potassium: 4.3 mmol/L (ref 3.5–5.1)
Sodium: 136 mmol/L (ref 135–145)

## 2015-07-14 LAB — POCT I-STAT 3, ART BLOOD GAS (G3+)
ACID-BASE EXCESS: 2 mmol/L (ref 0.0–2.0)
Bicarbonate: 24.9 mEq/L — ABNORMAL HIGH (ref 20.0–24.0)
O2 SAT: 96 %
TCO2: 26 mmol/L (ref 0–100)
pCO2 arterial: 31.4 mmHg — ABNORMAL LOW (ref 35.0–45.0)
pH, Arterial: 7.506 — ABNORMAL HIGH (ref 7.350–7.450)
pO2, Arterial: 75 mmHg — ABNORMAL LOW (ref 80.0–100.0)

## 2015-07-14 NOTE — Progress Notes (Signed)
Pt refuse NIV for the night 

## 2015-07-14 NOTE — Progress Notes (Signed)
Pt is on NIV at this time tolerating it well.  

## 2015-07-14 NOTE — Evaluation (Addendum)
Physical Therapy Evaluation Patient Details Name: [name] MRN: [id number] DOB: 1945/11/05 Today's Date: 07/14/2015   History of Present Illness  [name] is a 70 y.o. male presenting with worsening encephalopathy. 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 He required intubation from 3/21-3/27 thought to be due to severe sleep apnea.   Clinical Impression  Patient presents with decreased independence with mobility due to deficits listed in PT problem list.  He will benefit from skilled PT in the acute setting to allow return home with family support following SNF level rehab stay.  Will monitor for improvement in cognition and safety in which case may be able to return home with wife assist.  Right now needs 2 A for safety due to ataxia and weakness.    Follow Up Recommendations SNF    Equipment Recommendations  Other (comment) (TBA)    Recommendations for Other Services       Precautions / Restrictions Precautions Precautions: Fall      Mobility  Bed Mobility               General bed mobility comments: up in recliner  Transfers Overall transfer level: Needs assistance Equipment used: Rolling walker (2 wheeled) Transfers: Sit to/from Stand Sit to Stand: Mod assist         General transfer comment: increased time, cues for hand placement, posterior bias and ataxia noted  Ambulation/Gait Ambulation/Gait assistance: Mod assist;+2 safety/equipment Ambulation Distance (Feet): 90 Feet Assistive device: Rolling walker (2 wheeled) Gait Pattern/deviations: Step-through pattern;Ataxic;Scissoring;Decreased stride length;Wide base of support;Trunk flexed     General Gait Details: cue for trunk flexion to prevent posterior LOB, cues for wide BOS due to ataxia and LOB if scissoring, cue for forward gaze for posture, RN  following with chair; LOB lateral x 2 heavy mod to recover  Stairs            Wheelchair Mobility    Modified Rankin (Stroke Patients Only)       Balance Overall balance assessment: Needs assistance     Sitting balance - Comments: seated in recliner, balance NT without UE support Postural control: Posterior lean Standing balance support: Bilateral upper extremity supported Standing balance-Leahy Scale: Poor                               Pertinent Vitals/Pain Pain Assessment: No/denies pain    Home Living Family/patient expects to be discharged to:: Private residence Living Arrangements: Spouse/significant other Available Help at Discharge: Family Type of Home: House Home Access: Stairs to enter Entrance Stairs-Rails: Psychiatric nurse of Steps: 4 Home Layout: Two level;Able to live on main level with bedroom/bathroom Home Equipment: Walker - 2 wheels      Prior Function Level of Independence: Independent with assistive device(s)         Comments: reports started using walker recently; pt not reliable historian     Hand Dominance        Extremity/Trunk Assessment   Upper Extremity Assessment: Generalized weakness           Lower Extremity Assessment: RLE deficits/detail;LLE deficits/detail RLE Deficits / Details: AROM WFL, strength grossly 4/5 LLE Deficits / Details: AROM WFL, strength grossly 4/5  Cervical / Trunk Assessment: Kyphotic  Communication   Communication: HOH  Cognition Arousal/Alertness: Awake/alert Behavior During Therapy: WFL for tasks assessed/performed Overall  Cognitive Status: Impaired/Different from baseline Area of Impairment: Safety/judgement;Problem solving;Attention;Orientation Orientation Level: Situation;Time Current Attention Level: Selective Memory: Decreased short-term memory   Safety/Judgement: Decreased awareness of safety;Decreased awareness of deficits   Problem Solving: Slow  processing;Requires verbal cues;Requires tactile cues      General Comments General comments (skin integrity, edema, etc.): pt thought his wife was sitting beside him in a chair, but no one there.  Was trying to work his way out of chair when I entered    Exercises        Assessment/Plan    PT Assessment Patient needs continued PT services  PT Diagnosis Abnormality of gait;Generalized weakness;Altered mental status   PT Problem List Decreased strength;Decreased activity tolerance;Decreased balance;Decreased mobility;Decreased safety awareness;Decreased knowledge of precautions;Decreased knowledge of use of DME;Decreased cognition  PT Treatment Interventions DME instruction;Gait training;Stair training;Functional mobility training;Therapeutic activities;Therapeutic exercise;Balance training;Cognitive remediation;Patient/family education   PT Goals (Current goals can be found in the Care Plan section) Acute Rehab PT Goals Patient Stated Goal: To go home PT Goal Formulation: With patient Time For Goal Achievement: 07/28/15 Potential to Achieve Goals: Fair    Frequency Min 3X/week   Barriers to discharge        Co-evaluation               End of Session Equipment Utilized During Treatment: Gait belt Activity Tolerance: Patient tolerated treatment well Patient left: in chair;with call bell/phone within reach;with chair alarm set           Time: [id number] PT Time Calculation (min) (ACUTE ONLY): 28 min   Charges:   PT Evaluation $PT Eval High Complexity: 1 Procedure PT Treatments $Gait Training: 8-22 mins   PT G CodesReginia Naas 07/15/2015, 4:27 PM Magda Kiel, [place] 07/15/2015

## 2015-07-14 NOTE — Progress Notes (Signed)
Family Medicine Teaching Service Daily Progress Note Intern Pager: [phone]  Patient name: [name] Medical record number: [id number] Date of birth: 09-Dec-1945 Age: 70 y.o. Gender: male  Primary Care Provider: Chesley Noon, MD Consultants: GI, CCM Code Status: Full  Pt Overview and Major Events to Date:  3/20 Admitted for AMS after abrupt cessation of ETOH. Treated as hepatic encephalopathy. 3/21 Became somnolent/obtunded. PCCM consulted for hypercapneic resp failure. Started on rocephin.  3/24 Attempted extubation, promptly failed / reintubated; GI signed off  3/26 Weaning on PSV  3/27  Extubated and doing well.  3/28  Rocephin stopped 3/39  Trialled off of BiPap last night; maintained oxygen saturations  Assessment and Plan: [name] is a 70 y.o. male presenting with worsening encephalopathy. 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 He required intubation from 3/21-3/27 thought to be due to severe sleep apnea.   Encephalopathy: Confused this morning with wandering thoughts. History of alcoholic cirrhosis on lactulose with decreased frequency of bowel movements on initial presentation. He has also been being monitored on CIWA protocol for continued alcohol abuse at home and hallucinations reported by wife the night prior to admission. Wife initially had concern for stroke with onset of declining status 4 weeks ago. CT head negative for acute processes. Afebrile and no leukocytosis. Has daytime snoring while awake and required intubation for hypercapneic respiratory failure, making severe sleep apnea a likely cause of his confusion. Wernicke's encephalopathy is also in the ddx especially with patient's confused speech.  - Monitor respiratory status with continuous pulse oximetry - Obtain ABG this a.m. for increased confusion -  MRI/MRA head without evidence of stroke but with significant narrowing of cerebral and cerebellar arteries, hyperintensity of globus pallidus c/w hepatic encephalopathy - RPR nonreactive.  - Discontinued CIWA protocol, given extended period of time since last drink - [place] ABD 3/24 showed patent middle hepatic to R portal venous TIPS, trace ascites, small R pleural effusion  - SLP cleared for regular diet with thin liquids 07/13/15 - Continue thiamine 100 mg daily, consider increasing dose - PT consult ordered  EtOH cirrhosis: EtOH < 5, AST 256, ALT 66, Ammonia 96  - GI consulted, appreciate recommendations. Lactulose increased from 15 mg TID to 20 mg TID.  - Restart home atenolol but at decreased dose of 25 mg given HR in 60s  - Repeat INR today  AKI: Baseline ~ 1.2-1.3, at 1.60 today, 07/14/15, improved from yesterday's 1.87 - Consider restarting IVFs if not improving  Snoring at rest: Improved. Did receive decadron 3/28. Likely severe sleep apnea - Will need sleep study outpatient as soon as possible. To page pulmonologist Dr. Corrie Dandy to help arrange sleep study once patient cleared for d/c.  - Held BiPap last night and maintaing O2 sats, though more confused this morning  Penile ulcer: Does not look infected. Scab nearly gone. No leukocytosis.  - RPR nonreactive - Gc/chlamydia negative  S/p esophagectomy: - Protonix ordered as substitute for home prilosec.   FEN/GI: Heart healthy diet; d/c NG tube Prophylaxis: heparin, protonix  Disposition: Pending PT recs and ability to schedule sleep study   Subjective:  Patient was placing television remote in his mouth when this examiner entered his room. He mentioned that police had just come to talk to him because he'd fallen over. He mentioned a doctor had been in to arrange a sleep study at [facility]. Conferred with  patient's nurse who confirmed that none of these things had happened this a.m. He denies pain and said he is feeling well  but would not want to go home today because "the device came in two packets," referring to the television remote control.   Objective: Temp:  [98.1 F (36.7 C)-98.4 F (36.9 C)] 98.1 F (36.7 C) (03/30 0800) Pulse Rate:  [65-90] 69 (03/30 1000) Resp:  [12-28] 13 (03/30 1000) BP: (127-171)/(78-99) 135/78 mmHg (03/30 1000) SpO2:  [95 %-99 %] 98 % (03/30 1000) Weight:  [219 lb 9.3 oz (99.6 kg)] 219 lb 9.3 oz (99.6 kg) (03/30 0500) Physical Exam: General: Chronically ill appearing man, alert, sitting up in bed  Eyes: PERRL, EOMI, no ptosis ENTM: Mallampati Class 4, MMM; Clot on left side of back of throat Cardiovascular: RRR, S1, S2, no m/r/g Chest: Moving good air throughout all lung fields. Mild stridor heard with ausculation of neck only.   Abdomen: +BS, S, NT, distended and hyper-resonnant  Neuro: AOx3 (person, month and year; [place] but thought he was at [place]) and does often answer appropriately to direct questions; however, thoughts are very confused. There is some sentence structure to his speech but largely does not make sense. No asterixis. CNII-XII grossly intact.  GU: Wound on glans penis well healed and scab nearly gone.   Laboratory:  Recent Labs Lab 07/11/15 0240 07/13/15 0420 07/14/15 0805  WBC 10.5 7.1 7.3  HGB 9.3* 8.8* 9.9*  HCT 25.6* 24.9* 29.3*  PLT 111* 106* 110*    Recent Labs Lab 07/08/15 0330  07/11/15 0240 07/13/15 0420 07/14/15 0805  NA 137  < > 141 138 136  K 2.9*  < > 4.3 4.1 4.3  CL 108  < > 111 107 101  CO2 20*  < > 19* 21* 25  BUN 13  < > 20 27* 24*  CREATININE 1.60*  < > 1.74* 1.87* 1.60*  CALCIUM 7.7*  < > 8.8* 8.9 9.4  PROT 5.4*  --   --  6.2*  --   BILITOT 1.1  --   --  0.9  --   ALKPHOS 84  --   --  98  --   ALT 39  --   --  32  --   AST 109*  --   --  48*  --   GLUCOSE 80  < > 121* 108* 78  < > = values in this interval not displayed.  Imaging/Diagnostic Tests: Dg Chest 2 View  07/04/2015  CLINICAL DATA:  Altered  mental status EXAM: CHEST  2 VIEW COMPARISON:  11/10/2009 FINDINGS: Cardiac enlargement. Pulmonary vascular congestion without edema. Hypoventilation with bibasilar atelectasis. IMPRESSION: Cardiac enlargement with pulmonary vascular congestion. Negative for edema Bibasilar atelectasis Electronically Signed   By: Franchot Gallo M.D.   On: 07/04/2015 12:51   Ct Head Wo Contrast  07/04/2015  IMPRESSION: Atrophy and chronic white matter changes.  No acute abnormality. Electronically Signed   By: Franchot Gallo M.D.   On: 07/04/2015 13:00   Mr Jodene Nam Head Wo Contrast  07/04/2015  IMPRESSION: MRI HEAD Exam is motion degraded. No acute infarct or intracranial hemorrhage. T1 increased hyperintensity globus pallidus. This has been described in patients with hepatic encephalopathy. Mild chronic small vessel disease changes. Global atrophy without hydrocephalus. No intracranial mass lesion noted on this unenhanced exam. Spinal stenosis C3-4 suspected although incompletely assessed secondary to motion. Degenerative changes C1-2. MRA HEAD Exam is motion degraded. Right vertebral artery is occluded. Mild  to moderate narrowing proximal basilar artery. Nonvisualized posterior inferior cerebellar arteries and left anterior inferior cerebellar artery with only small portion of the right anterior inferior cerebellar artery visualized. Small superior cerebellar arteries bilaterally. Narrowing distal posterior cerebral artery branches bilaterally. Artifact pre cavernous segment internal carotid artery greater on left limits evaluation for detection of stenosis in this region. Mild irregularity cavernous segment internal carotid artery bilaterally. Poor delineation of a majority of the middle cerebral artery branches bilaterally which may be narrowed. Electronically Signed   By: Genia Del M.D.   On: 07/04/2015 18:10   Mr Brain Wo Contrast  07/04/2015  IMPRESSION: MRI HEAD Exam is motion degraded. No acute infarct or  intracranial hemorrhage. T1 increased hyperintensity globus pallidus. This has been described in patients with hepatic encephalopathy. Mild chronic small vessel disease changes. Global atrophy without hydrocephalus. No intracranial mass lesion noted on this unenhanced exam. Spinal stenosis C3-4 suspected although incompletely assessed secondary to motion. Degenerative changes C1-2. MRA HEAD Exam is motion degraded. Right vertebral artery is occluded. Mild to moderate narrowing proximal basilar artery. Nonvisualized posterior inferior cerebellar arteries and left anterior inferior cerebellar artery with only small portion of the right anterior inferior cerebellar artery visualized. Small superior cerebellar arteries bilaterally. Narrowing distal posterior cerebral artery branches bilaterally. Artifact pre cavernous segment internal carotid artery greater on left limits evaluation for detection of stenosis in this region. Mild irregularity cavernous segment internal carotid artery bilaterally. Poor delineation of a majority of the middle cerebral artery branches bilaterally which may be narrowed. Electronically Signed   By: Genia Del M.D.   On: 07/04/2015 18:10   [place] Art/ven Flow Abd Pelv Doppler  07/09/2015  IMPRESSION: 1. Patent middle hepatic to right portal venous TIPS. No evidence of stenosis or occlusion. 2. The right and left hepatic veins are not well seen on this study. 3. Trace ascites and small right pleural effusion. Signed, Criselda Peaches, MD [facility] Radiology Electronically Signed   By: Jacqulynn Cadet M.D.   On: 07/09/2015 09:21   Dg Chest Port 1 View  07/11/2015  IMPRESSION: Slight interval improvement in the left lower lobe atelectasis or pneumonia. Improved aeration overall. Stable cardiomegaly without significant pulmonary vascular congestion. Electronically Signed   By: David  Martinique M.D.   On: 07/11/2015 07:13   Dg Chest Port 1  View  07/10/2015  IMPRESSION: Stable support apparatus. Mild elevation of the right hemidiaphragm. Persistent left basilar atelectasis. No pulmonary edema. Electronically Signed   By: Lahoma Crocker M.D.   On: 07/10/2015 09:54   Dg Chest Port 1 View  07/09/2015  IMPRESSION: Worsening aeration. ET tube slightly low. This should be withdrawn approximately 3 cm. Electronically Signed   By: Staci Righter M.D.   On: 07/09/2015 10:03   Dg Chest Port 1 View  07/08/2015  IMPRESSION: Endotracheal tube in place. NG tube with tip within distal stomach. Mild tortuous NG tube within hiatal hernia. No pneumothorax. Mild basilar atelectasis. No segmental infiltrate. Electronically Signed   By: Lahoma Crocker M.D.   On: 07/08/2015 17:34   Dg Chest Port 1 View  07/08/2015  IMPRESSION: 1. Endotracheal tube tip projects approximately 2 cm above the carina. 2. Stable dense left lower lobe atelectasis and/or pneumonia. 3. Stable cardiomegaly without pulmonary edema. 4. No new abnormalities. Electronically Signed   By: Evangeline Dakin M.D.   On: 07/08/2015 07:26   Dg Chest Port 1 View  07/07/2015  IMPRESSION: 1. Endotracheal tube in stable  position. 2.  Stable cardiomegaly. 3. Low lung volumes with bibasilar atelectasis and/or infiltrates. Electronically Signed   By: Marcello Moores  Register   On: 07/07/2015 07:27   Portable Chest Xray  07/06/2015  CLINICAL DATA:  IMPRESSION: Slight interval increase in bibasilar atelectasis or pneumonia. The study is limited due to patient positioning. Electronically Signed   By: David  Martinique M.D.   On: 07/06/2015 07:16   Dg Chest Port 1 View  07/05/2015  IMPRESSION: Endotracheal tube tip measuring 2.5 cm above the carina. Cardiac enlargement with mild vascular congestion. No edema or consolidation. Electronically Signed   By: Lucienne Capers M.D.   On: 07/05/2015 20:16   Dg Abd 2 Views  07/04/2015  IMPRESSION: No acute abnormality is noted.  Scattered fecal material is seen. Electronically  Signed   By: Inez Catalina M.D.   On: 07/04/2015 18:31   Dg Loyce Dys Tube Plc W/fl W/rad  07/08/2015  IMPRESSION: Fluoroscopically guided placement of a nasoenteric feeding tube with its tip in the 4th portion of the duodenum. The feeding tube is ready for immediate use. Electronically Signed   By: Evangeline Dakin M.D.   On: 07/08/2015 10:55   Hillary Corinda Gubler, MD 07/14/2015, 11:02 AM PGY-1, [place] Intern pager: [phone], text pages welcome

## 2015-07-15 LAB — BLOOD GAS, ARTERIAL
ACID-BASE EXCESS: 1.7 mmol/L (ref 0.0–2.0)
BICARBONATE: 25 meq/L — AB (ref 20.0–24.0)
DRAWN BY: 331761
FIO2: 0.21
O2 Saturation: 94.1 %
PCO2 ART: 33.8 mmHg — AB (ref 35.0–45.0)
PH ART: 7.48 — AB (ref 7.350–7.450)
Patient temperature: 97.6
TCO2: 26.1 mmol/L (ref 0–100)
pO2, Arterial: 70.5 mmHg — ABNORMAL LOW (ref 80.0–100.0)

## 2015-07-15 LAB — BASIC METABOLIC PANEL
ANION GAP: 10 (ref 5–15)
BUN: 21 mg/dL — ABNORMAL HIGH (ref 6–20)
CALCIUM: 9.6 mg/dL (ref 8.9–10.3)
CO2: 26 mmol/L (ref 22–32)
CREATININE: 1.65 mg/dL — AB (ref 0.61–1.24)
Chloride: 99 mmol/L — ABNORMAL LOW (ref 101–111)
GFR, EST AFRICAN AMERICAN: 46 mL/min — AB (ref >60)
GFR, EST NON AFRICAN AMERICAN: 40 mL/min — AB (ref >60)
GLUCOSE: 87 mg/dL (ref 65–99)
Potassium: 4.2 mmol/L (ref 3.5–5.1)
Sodium: 135 mmol/L (ref 135–145)

## 2015-07-15 LAB — PROTIME-INR
INR: 1.13 (ref 0.00–1.49)
Prothrombin Time: 14.7 seconds (ref 11.6–15.2)

## 2015-07-15 LAB — CBC
HCT: 32.1 % — ABNORMAL LOW (ref 39.0–52.0)
Hemoglobin: 10.8 g/dL — ABNORMAL LOW (ref 13.0–17.0)
MCH: 33.4 pg (ref 26.0–34.0)
MCHC: 33.6 g/dL (ref 30.0–36.0)
MCV: 99.4 fL (ref 78.0–100.0)
PLATELETS: 132 10*3/uL — AB (ref 150–400)
RBC: 3.23 MIL/uL — ABNORMAL LOW (ref 4.22–5.81)
RDW: 15.2 % (ref 11.5–15.5)
WBC: 6.1 10*3/uL (ref 4.0–10.5)

## 2015-07-15 MED ORDER — METHYLPREDNISOLONE SODIUM SUCC 125 MG IJ SOLR
125.0000 mg | Freq: Once | INTRAMUSCULAR | Status: AC
  Administered 2015-07-15: 125 mg via INTRAVENOUS
  Filled 2015-07-15: qty 2

## 2015-07-15 MED ORDER — LACTULOSE ENEMA
300.0000 mL | Freq: Once | ORAL | Status: AC
  Administered 2015-07-15: 300 mL via RECTAL
  Filled 2015-07-15: qty 300

## 2015-07-15 MED ORDER — LACTULOSE 10 GM/15ML PO SOLN
10.0000 g | Freq: Once | ORAL | Status: AC
  Administered 2015-07-15: 10 g via ORAL
  Filled 2015-07-15: qty 15

## 2015-07-15 MED ORDER — PANTOPRAZOLE SODIUM 40 MG PO TBEC
40.0000 mg | DELAYED_RELEASE_TABLET | Freq: Every day | ORAL | Status: DC
  Administered 2015-07-16 – 2015-07-22 (×7): 40 mg via ORAL
  Filled 2015-07-15 (×7): qty 1

## 2015-07-15 MED ORDER — LACTULOSE 10 GM/15ML PO SOLN
30.0000 g | Freq: Three times a day (TID) | ORAL | Status: DC
  Administered 2015-07-15 – 2015-07-16 (×5): 30 g via ORAL
  Filled 2015-07-15 (×6): qty 45

## 2015-07-15 MED ORDER — IPRATROPIUM-ALBUTEROL 0.5-2.5 (3) MG/3ML IN SOLN
3.0000 mL | Freq: Two times a day (BID) | RESPIRATORY_TRACT | Status: DC
  Administered 2015-07-15 – 2015-07-22 (×14): 3 mL via RESPIRATORY_TRACT
  Filled 2015-07-15 (×14): qty 3

## 2015-07-15 NOTE — Progress Notes (Signed)
Family Medicine Teaching Service Daily Progress Note Intern Pager: [phone]  Patient name: [name] Medical record number: [id number] Date of birth: [birth year] Age: 70 y.o. Gender: male  Primary Care Provider: Chesley Noon, MD Consultants: GI, CCM Code Status: Full  Pt Overview and Major Events to Date:  3/20 Admitted for AMS after abrupt cessation of ETOH. Treated as hepatic encephalopathy. 3/21 Became somnolent/obtunded. PCCM consulted for hypercapneic resp failure. Started on rocephin.  3/24 Attempted extubation, promptly failed / reintubated; GI signed off  3/26 Weaning on PSV  3/27  Extubated and doing well.  3/28  Rocephin stopped 3/29  Trialled off of BiPap last night; maintained oxygen saturations 3/30  Continue nightly BiPap  Assessment and Plan: [name] is a 70 y.o. male presenting with worsening encephalopathy. 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 He required intubation from 3/21-3/27 thought to be due to severe sleep apnea.   Encephalopathy: Still confused this morning with wandering thoughts and hallucinations. History of alcoholic cirrhosis on lactulose with decreased frequency of bowel movements on initial presentation. He has also been being monitored on CIWA protocol for continued alcohol abuse at home and hallucinations reported by wife the night prior to admission. Wife initially had concern for stroke with onset of declining status 4 weeks ago. CT head negative for acute processes. Afebrile and no leukocytosis. Has daytime snoring while awake and required intubation for hypercapneic respiratory failure, making severe sleep apnea a likely cause of his confusion. Wernicke's encephalopathy is also in the ddx. Of note, he continued BiPAP last night but still is confused. Had BM last night per nurse but not  regularly with lactulose. - Monitor respiratory status with continuous pulse oximetry - MRI/MRA head without evidence of stroke but with significant narrowing of cerebral and cerebellar arteries, hyperintensity of globus pallidus c/w hepatic encephalopathy - Continue nightly BiPAP and plan to continue at SNF - RPR nonreactive.  - Discontinued CIWA protocol, given extended period of time since last drink - [place] ABD 3/24 showed patent middle hepatic to R portal venous TIPS, trace ascites, small R pleural effusion  - SLP cleared for regular diet with thin liquids 07/13/15 - Continue thiamine 100 mg daily - PT consult ordered --> recommending SNF; SW consult placed  EtOH cirrhosis: EtOH < 5, AST 256, ALT 66, Ammonia 96. Nurse reports he had a BM last night but not yet today with lactulose.   - GI consulted, appreciate recommendations has signed off. - Increase lactulose to 30 mg TID, from 20 mg TID.  - Continue home atenolol but at decreased dose of 25 mg given HR in 60s  - INR 1.13  AKI: Baseline ~ 1.2-1.3, at 1.65 today, 07/15/15 - Consider restarting IVFs if not improving  Snoring at rest: Improved. Did receive decadron 3/28. Likely severe sleep apnea - Will need sleep study outpatient as soon as possible. To page pulmonologist Dr. Corrie Dandy to help arrange sleep study once patient cleared for d/c.   Penile ulcer: Does not look infected. Scab nearly gone. No leukocytosis.  - RPR nonreactive - Gc/chlamydia negative  S/p esophagectomy: - Protonix ordered as substitute for home prilosec.   FEN/GI: Heart healthy diet; d/c NG tube Prophylaxis: heparin, protonix  Disposition: Pending SNF placement and ability to schedule sleep study. Transfer to floor bed with telemetry.    Subjective:  Patient denies pain. He complained of a leak in the  roof when it was raining last night, which did not happen. Wife requesting travel note, as they had had plans to fly to [place] this summer, and she sees  that he will be unlikely to go.   Objective: Temp:  [97.4 F (36.3 C)-97.7 F (36.5 C)] 97.7 F (36.5 C) (03/31 1200) Pulse Rate:  [58-76] 64 (03/31 1200) Resp:  [10-21] 13 (03/31 1200) BP: (103-146)/(70-94) 103/70 mmHg (03/31 1200) SpO2:  [95 %-100 %] 98 % (03/31 1200) Weight:  [216 lb 3.2 oz (98.068 kg)] 216 lb 3.2 oz (98.068 kg) (03/31 0426) Physical Exam: General: Chronically ill appearing man, alert, sitting up in recliner  Eyes: PERRL, EOMI, no ptosis ENTM: Mallampati Class 4, MMM Cardiovascular: RRR, S1, S2, no m/r/g Chest: Moving good air throughout all lung fields. Mild stridor heard from upper airway.   Abdomen: +BS, S, NT, distended and hyper-resonnant  Neuro: AOx2 (person, place but not date thought was September) and does often answer appropriately to direct questions; however, thoughts are very confused. There is some sentence structure to his speech but largely does not make sense. No asterixis. CNII-XII grossly intact.  GU: Wound on glans penis well healed and scab nearly gone.   Laboratory:  Recent Labs Lab 07/13/15 0420 07/14/15 0805 07/15/15 0430  WBC 7.1 7.3 6.1  HGB 8.8* 9.9* 10.8*  HCT 24.9* 29.3* 32.1*  PLT 106* 110* 132*    Recent Labs Lab 07/13/15 0420 07/14/15 0805 07/15/15 0430  NA 138 136 135  K 4.1 4.3 4.2  CL 107 101 99*  CO2 21* 25 26  BUN 27* 24* 21*  CREATININE 1.87* 1.60* 1.65*  CALCIUM 8.9 9.4 9.6  PROT 6.2*  --   --   BILITOT 0.9  --   --   ALKPHOS 98  --   --   ALT 32  --   --   AST 48*  --   --   GLUCOSE 108* 78 87    Imaging/Diagnostic Tests: Dg Chest 2 View  07/04/2015  CLINICAL DATA:  Altered mental status EXAM: CHEST  2 VIEW COMPARISON:  11/10/2009 FINDINGS: Cardiac enlargement. Pulmonary vascular congestion without edema. Hypoventilation with bibasilar atelectasis. IMPRESSION: Cardiac enlargement with pulmonary vascular congestion. Negative for edema Bibasilar atelectasis Electronically Signed   By: Franchot Gallo  M.D.   On: 07/04/2015 12:51   Ct Head Wo Contrast  07/04/2015  IMPRESSION: Atrophy and chronic white matter changes.  No acute abnormality. Electronically Signed   By: Franchot Gallo M.D.   On: 07/04/2015 13:00   Mr Jodene Nam Head Wo Contrast  07/04/2015  IMPRESSION: MRI HEAD Exam is motion degraded. No acute infarct or intracranial hemorrhage. T1 increased hyperintensity globus pallidus. This has been described in patients with hepatic encephalopathy. Mild chronic small vessel disease changes. Global atrophy without hydrocephalus. No intracranial mass lesion noted on this unenhanced exam. Spinal stenosis C3-4 suspected although incompletely assessed secondary to motion. Degenerative changes C1-2. MRA HEAD Exam is motion degraded. Right vertebral artery is occluded. Mild to moderate narrowing proximal basilar artery. Nonvisualized posterior inferior cerebellar arteries and left anterior inferior cerebellar artery with only small portion of the right anterior inferior cerebellar artery visualized. Small superior cerebellar arteries bilaterally. Narrowing distal posterior cerebral artery branches bilaterally. Artifact pre cavernous segment internal carotid artery greater on left limits evaluation for detection of stenosis in this region. Mild irregularity cavernous segment internal carotid artery bilaterally. Poor delineation of a majority of the middle cerebral artery branches bilaterally which may be narrowed.  Electronically Signed   By: Genia Del M.D.   On: 07/04/2015 18:10   Mr Brain Wo Contrast  07/04/2015  IMPRESSION: MRI HEAD Exam is motion degraded. No acute infarct or intracranial hemorrhage. T1 increased hyperintensity globus pallidus. This has been described in patients with hepatic encephalopathy. Mild chronic small vessel disease changes. Global atrophy without hydrocephalus. No intracranial mass lesion noted on this unenhanced exam. Spinal stenosis C3-4 suspected although incompletely assessed  secondary to motion. Degenerative changes C1-2. MRA HEAD Exam is motion degraded. Right vertebral artery is occluded. Mild to moderate narrowing proximal basilar artery. Nonvisualized posterior inferior cerebellar arteries and left anterior inferior cerebellar artery with only small portion of the right anterior inferior cerebellar artery visualized. Small superior cerebellar arteries bilaterally. Narrowing distal posterior cerebral artery branches bilaterally. Artifact pre cavernous segment internal carotid artery greater on left limits evaluation for detection of stenosis in this region. Mild irregularity cavernous segment internal carotid artery bilaterally. Poor delineation of a majority of the middle cerebral artery branches bilaterally which may be narrowed. Electronically Signed   By: Genia Del M.D.   On: 07/04/2015 18:10   [place] Art/ven Flow Abd Pelv Doppler  07/09/2015  IMPRESSION: 1. Patent middle hepatic to right portal venous TIPS. No evidence of stenosis or occlusion. 2. The right and left hepatic veins are not well seen on this study. 3. Trace ascites and small right pleural effusion. Signed, Criselda Peaches, MD [facility] Radiology Electronically Signed   By: Jacqulynn Cadet M.D.   On: 07/09/2015 09:21   Dg Chest Port 1 View  07/11/2015  IMPRESSION: Slight interval improvement in the left lower lobe atelectasis or pneumonia. Improved aeration overall. Stable cardiomegaly without significant pulmonary vascular congestion. Electronically Signed   By: David  Martinique M.D.   On: 07/11/2015 07:13   Dg Chest Port 1 View  07/10/2015  IMPRESSION: Stable support apparatus. Mild elevation of the right hemidiaphragm. Persistent left basilar atelectasis. No pulmonary edema. Electronically Signed   By: Lahoma Crocker M.D.   On: 07/10/2015 09:54   Dg Chest Port 1 View  07/09/2015  IMPRESSION: Worsening aeration. ET tube slightly low. This should be  withdrawn approximately 3 cm. Electronically Signed   By: Staci Righter M.D.   On: 07/09/2015 10:03   Dg Chest Port 1 View  07/08/2015  IMPRESSION: Endotracheal tube in place. NG tube with tip within distal stomach. Mild tortuous NG tube within hiatal hernia. No pneumothorax. Mild basilar atelectasis. No segmental infiltrate. Electronically Signed   By: Lahoma Crocker M.D.   On: 07/08/2015 17:34   Dg Chest Port 1 View  07/08/2015  IMPRESSION: 1. Endotracheal tube tip projects approximately 2 cm above the carina. 2. Stable dense left lower lobe atelectasis and/or pneumonia. 3. Stable cardiomegaly without pulmonary edema. 4. No new abnormalities. Electronically Signed   By: Evangeline Dakin M.D.   On: 07/08/2015 07:26   Dg Chest Port 1 View  07/07/2015  IMPRESSION: 1. Endotracheal tube in stable position. 2.  Stable cardiomegaly. 3. Low lung volumes with bibasilar atelectasis and/or infiltrates. Electronically Signed   By: Marcello Moores  Register   On: 07/07/2015 07:27   Portable Chest Xray  07/06/2015  CLINICAL DATA:  IMPRESSION: Slight interval increase in bibasilar atelectasis or pneumonia. The study is limited due to patient positioning. Electronically Signed   By: David  Martinique M.D.   On: 07/06/2015 07:16   Dg Chest Port 1 View  07/05/2015  IMPRESSION: Endotracheal tube tip measuring  2.5 cm above the carina. Cardiac enlargement with mild vascular congestion. No edema or consolidation. Electronically Signed   By: Lucienne Capers M.D.   On: 07/05/2015 20:16   Dg Abd 2 Views  07/04/2015  IMPRESSION: No acute abnormality is noted.  Scattered fecal material is seen. Electronically Signed   By: Inez Catalina M.D.   On: 07/04/2015 18:31   Dg Loyce Dys Tube Plc W/fl W/rad  07/08/2015  IMPRESSION: Fluoroscopically guided placement of a nasoenteric feeding tube with its tip in the 4th portion of the duodenum. The feeding tube is ready for immediate use. Electronically Signed   By: Evangeline Dakin M.D.   On:  07/08/2015 10:55   Soyla Bainter Corinda Gubler, MD 07/15/2015, 12:52 PM PGY-1, [place] Intern pager: [phone], text pages welcome

## 2015-07-15 NOTE — Care Management Important Message (Signed)
Important Message  Patient Details  Name: [name] MRN: [id number] Date of Birth: 02-Feb-1946   Medicare Important Message Given:  Yes    Barb Merino Craven 07/15/2015, 12:16 PM

## 2015-07-15 NOTE — Discharge Summary (Signed)
[name] Discharge Summary  Patient name: [name] Medical record number: [id number] Date of birth: [birth year] Age: 70 y.o. Gender: male Date of Admission: 07/04/2015  Date of Discharge: 07/22/2015 Admitting Physician: Tony Dawn, MD  Primary Care Provider: Chesley Noon, MD Consultants: CCM, GI  Indication for Hospitalization: Acute encephalopathy   Discharge Diagnoses/Problem List:  Active Problems:   Encephalopathy   Hypercarbic respiratory failure   Abdominal distention   Alcohol abuse   EtOH Cirrhosis    Penile ulcer  Disposition: SNF  Discharge Condition: Stable  Discharge Exam: General: lying in bed, awake, responding to questions appropriately Eyes: PERRL, EOMI, no ptosis Cardiovascular: RRR, S1, S2 Chest: Moving good air throughout all lung fields, no crackles today Abdomen: +BS, soft, NT, resonant to percussion GU: lesion over the dorsum of his penis. No sign of infection or cellulitis. Condom catheter in place Neuro: AOx4, no gross deficit  Brief Benjamin Course:  [name] is a 70 y.o. male who presented with worsening encephalopathy. 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   Encephalopathy/EtOH Cirrhosis: improved Altered mental status likely related to hepatic encephalopathy and hypercapnea secondary to severe obstructive sleep apnea with daytime snoring while awake. There was concern about CVA but MRI was unremarkable for any acute infarct or intracranial hemorrhage but showed hyperintensity of globus pallidus. Patient had not had a bowel movement in days prior to admission. On admission, EtOH < 5, AST 256, ALT 66, and ammonia was 96 (136 four days prior to admission). [place] ABD 3/24 showed patent middle hepatic to R portal venous TIPS, trace ascites, small R pleural effusion.  Abdominal xray showed gas and scattered fecal material, but no significant stool burden.  He was started on lactulose and rifaximin. Lactulose was titrated up with subsequent improvement in his mental status. His AST and ALT trended down to normal range.   Hypercapneic respiratory failure: resolved Thought to be secondary to inability to protect airway, most likely due to severe sleep apnea vs. possible aspiration pneumonitis. He required intubation from 3/21-3/27. Received 8-day course of rocephin, as CXR 07/06/15 could not rule out pneumonia vs. atelectasis (and to cover for possible SBP). Trach and blood cultures were negative.   On 4/2, patient became septic with low BP and hypothermia needing bolus fluids with no significant improvement in his BP. Atenolol was held. Blood and urine cultures collected. He was started on vanc, zosyn and midodrine . Critical care consulted and discussed goal of care with patient and family. Patient code status changed to DNR. We continued his antibiotics until cultures were negative and resolutions of sepsis.  On 4/4, repeat CXR was unchanged from previous. However, patient showed significant improvement clinically. So, his antibitoics was narrowed to Levaquin to complete the course for 4 more days. He is continued on midodrine at discharge.   Snoring at rest/OSA: Improved with decadron 3/28 and solumedrol 120 mcg 3/31. Mallampati Class 4. Had remote tonsillectomy. Used BiPAP briefly which helped initaily but patient didn't tolerate later. He also declined CPAP. However, his oxygen saturation has been very stable without CPAP. His bicarb on BMP was also normal. We contacted Dr. Daryel November (pulmunologist) to discuss about CPAP setting if he need to use at [facility]. Dr. Daryel November says it is challenging to order one now as he didn't use in the last two days. However, he will follow up on him  to resolve this issue and arrange sleep study.   AKI with electrolyte abnormalities and  urinary retention: resolved Benjamin course was complicated by AKI, hypokalemia, hypomagnesia, and hyponatremia. Electrolytes and SCr were monitored and stabilized by time of discharge. Also urinary retentions on 4/2. Foley placed. Passed voiding trial on 4/5 and foley discontinued. He has been voiding well since then.   Penile Ulcer: Scab on glans had almost completely healed by time of discharge. RPR nonreactive, gc/chlamydia negative.   Issues for Follow Up:  1. OSA: patient with significant during the day. Patient will need sleep study in order to continue NIV once discharged home. Dr. Sharma Covert dios (pulmunologist) to follow up on patient and arrange sleep study as outpatient.  2. BP: discharged on midodrine 5 mg three times a day. Atenolol discontinued due to low blood pressures. 2.    Hepatic encephalopathy: discharged on lactulose and rifaximin. Titrate per BM. Assess compliance and response  Significant Procedures: ECHO, intubation  Significant Labs and Imaging:   Recent Labs Lab 07/19/15 0956 07/20/15 0915 07/21/15 0446  WBC 5.8 5.2 4.1  HGB 9.7* 10.4* 9.4*  HCT 28.6* 30.8* 27.3*  PLT 104* 107* 111*    Recent Labs Lab 07/16/15 0308  07/17/15 2235 07/18/15 0301 07/19/15 0300 07/20/15 0915 07/21/15 0446  NA 131*  < > 132* 137 135 135 135  K 4.4  < > 4.7 3.1* 3.6 4.0 4.0  CL 98*  < > 98* 106 105 101 100*  CO2 21*  < > 22 22 20* 24 25  GLUCOSE 143*  < > 120* 78 88 102* 82  BUN 19  < > 22* 19 16 12 11   CREATININE 1.44*  < > 1.33* 1.32* 1.46* 1.68* 1.42*  CALCIUM 9.4  < > 9.1 8.1* 8.2* 8.5* 8.5*  ALKPHOS 147*  --   --   --   --   --   --   AST 80*  --   --   --   --   --   --   ALT 50  --   --   --   --   --   --   ALBUMIN 3.5  --   --   --   --   --   --   < > = values in this interval not displayed.   Results/Tests Pending at Time of Discharge: none Discharge Medications:    Medication List    STOP taking these medications        atenolol 50 MG tablet  Commonly  known as:  TENORMIN     ibuprofen 200 MG tablet  Commonly known as:  ADVIL,MOTRIN      TAKE these medications        allopurinol 300 MG tablet  Commonly known as:  ZYLOPRIM  Take 300 mg by mouth daily.     budesonide 0.5 MG/2ML nebulizer solution  Commonly known as:  PULMICORT  Take 2 mLs (0.5 mg total) by nebulization 2 (two) times daily.     folic acid 1 MG tablet  Commonly known as:  FOLVITE  Take 1 tablet (1 mg total) by mouth daily.     ipratropium-albuterol 0.5-2.5 (3) MG/3ML Soln  Commonly known as:  DUONEB  Take 3 mLs by nebulization 2 (two) times daily.     lactulose 10 GM/15ML solution  Commonly known as:  CHRONULAC  Take 45 mLs (30 g total) by mouth 4 (four) times daily. Titrate for 2 bowel movements  a day.     midodrine 5 MG tablet  Commonly known as:  PROAMATINE  Take 1 tablet (5 mg total) by mouth 3 (three) times daily with meals.     multivitamin with minerals Tabs tablet  Take 1 tablet by mouth daily.     omeprazole 20 MG capsule  Commonly known as:  PRILOSEC  Take 20 mg by mouth daily.     rifaximin 200 MG tablet  Commonly known as:  XIFAXAN  Take 2 tablets (400 mg total) by mouth 3 (three) times daily.     thiamine 100 MG tablet  Take 1 tablet (100 mg total) by mouth daily.        Discharge Instructions: Please refer to Patient Instructions section of EMR for full details.  Patient was counseled important signs and symptoms that should prompt return to medical care, changes in medications, dietary instructions, activity restrictions, and follow up appointments.   Follow-Up Appointments:   Mercy Riding, MD 07/22/2015, 2:04 PM PGY-1, [place]

## 2015-07-15 NOTE — Progress Notes (Signed)
Pt is markedly confused and disoriented. Pt will not wear NIV for the night. Pt pulls the mask off just like with the neb he just gotten. Pt did get most of the neb.

## 2015-07-15 NOTE — Progress Notes (Addendum)
Physical Therapy Treatment Patient Details Name: [name] MRN: [id number] DOB: 1945/04/22 Today's Date: 07/15/2015    History of Present Illness [name] is a 70 y.o. male presenting with worsening encephalopathy. 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 He required intubation from 3/21-3/27 thought to be due to severe sleep apnea.     PT Comments    Patient extremely confused throughout session. Tolerated activity well but continues to have difficulty with problem solving, initiation, awareness and insight. At this time, patient continues to required 2 person assist for safety and to prevent falls despite use of assistive device. Will continue to see and progress as tolerated.  Follow Up Recommendations  SNF     Equipment Recommendations  Other (comment) (TBA)    Recommendations for Other Services       Precautions / Restrictions Precautions Precautions: Fall Restrictions Weight Bearing Restrictions: No    Mobility  Bed Mobility Overal bed mobility: Needs Assistance;+2 for physical assistance Bed Mobility: Rolling;Sidelying to Sit Rolling: Mod assist Sidelying to sit: Mod assist       General bed mobility comments: Moderate assist mainly for initiation and elevation to upright  Transfers Overall transfer level: Needs assistance Equipment used: Rolling walker (2 wheeled) Transfers: Sit to/from Stand Sit to Stand: Mod assist         General transfer comment: VCs for hand placement, manual assist for elevation to upright. Multi-modal cues for initiation.  Ambulation/Gait Ambulation/Gait assistance: Mod assist;+2 physical assistance Ambulation Distance (Feet): 100 Feet (x2) Assistive device: Rolling walker (2 wheeled) Gait Pattern/deviations: Step-through pattern;Decreased stride length;Ataxic;Drifts  right/left     General Gait Details: Patient with decreased initiation of step length, manual assist for pacing with RW guide. ASsist for stability, multiple LOB noted. Patient easily distracted with increased cues to direct to task.   Stairs            Wheelchair Mobility    Modified Rankin (Stroke Patients Only)       Balance Overall balance assessment: Needs assistance     Sitting balance - Comments: seated EOB with min guard assist   Standing balance support: Bilateral upper extremity supported Standing balance-Leahy Scale: Poor Standing balance comment: heavy reliance on RW                     Cognition Arousal/Alertness: Awake/alert   Overall Cognitive Status: Impaired/Different from baseline Area of Impairment: Orientation;Attention;Memory;Following commands;Safety/judgement;Awareness;Problem solving Orientation Level: Disoriented to;Place;Time;Situation Current Attention Level: Focused Memory: Decreased short-term memory Following Commands: Follows one step commands inconsistently;Follows one step commands with increased time Safety/Judgement: Decreased awareness of safety;Decreased awareness of deficits Awareness:  (pre-intellectual) Problem Solving: Slow processing;Decreased initiation;Difficulty sequencing;Requires verbal cues;Requires tactile cues      Exercises  Performed LE coordination activities in chair including ankle pumps with resistance and LAQs     General Comments        Pertinent Vitals/Pain  No Pain    Home Living                      Prior Function            PT Goals (current goals can now be found in the care plan section) Acute Rehab PT Goals Patient Stated Goal: no stated PT Goal Formulation: With patient Time For Goal Achievement: 07/28/15 Potential to Achieve Goals: Fair Progress towards PT goals: Progressing  toward goals    Frequency  Min 3X/week    PT Plan Current plan remains appropriate     Co-evaluation             End of Session Equipment Utilized During Treatment: Gait belt Activity Tolerance: Patient tolerated treatment well Patient left: in chair;with call bell/phone within reach;with chair alarm set     Time: [id number] PT Time Calculation (min) (ACUTE ONLY): 17 min  Charges:  $Gait Training: 8-22 mins                    G CodesDuncan Dull 2015-07-23, 9:06 AM Alben Deeds, PT DPT  [phone]

## 2015-07-15 NOTE — Progress Notes (Signed)
Speech Language Pathology Treatment: Dysphagia  Patient Details Name: [name] MRN: [id number] DOB: 25-Jul-1945 Today's Date: 07/15/2015 Time: [id number] SLP Time Calculation (min) (ACUTE ONLY): 22 min  Assessment / Plan / Recommendation Clinical Impression  Pt consumed regular mixed consistency bolus/thin liquids with one incidence of throat clearing (probable d/t inattention) with mixed consistency only, but no s/s of aspiration with thin via straw; educated pt/wife re: aspiration precautions and importance of small bites/sips (requiring minimal verbal cueing during snack) and eliminating environmental distractions during meals; continue current diet and ST f/u x1 for diet tolerance to ensure safety during meals and diet tolerance with current diet    HPI HPI: [name] with PMH alcoholic liver cirrhosis s/p TIPS, history of Esophogeal SCC s/p total esophagectomy, HTN, and Gout presents with worsening encephalopathy/AMS. His wife has noted increased hallucinations, altered mental status, and unsteadiness while ambulating over the past few days. CXR shows bibasilar atelectasis. MRI shows no acute infarct or intracranial hemorrhage.      SLP Plan  Continue with current plan of care     Recommendations  Diet recommendations: Regular;Thin liquid Liquids provided via: Cup;Straw Medication Administration: Whole meds with liquid Supervision: Intermittent supervision to cue for compensatory strategies Compensations: Minimize environmental distractions;Slow rate;Small sips/bites Postural Changes and/or Swallow Maneuvers: Seated upright 90 degrees             Oral Care Recommendations: Oral care BID Follow up Recommendations: None Plan: Continue with current plan of care                     ADAMS,PAT, M.S., CCC-SLP 07/15/2015, 11:17 AM

## 2015-07-16 LAB — CBC
HEMATOCRIT: 35.6 % — AB (ref 39.0–52.0)
Hemoglobin: 12.3 g/dL — ABNORMAL LOW (ref 13.0–17.0)
MCH: 33.8 pg (ref 26.0–34.0)
MCHC: 34.6 g/dL (ref 30.0–36.0)
MCV: 97.8 fL (ref 78.0–100.0)
Platelets: 144 10*3/uL — ABNORMAL LOW (ref 150–400)
RBC: 3.64 MIL/uL — ABNORMAL LOW (ref 4.22–5.81)
RDW: 15.1 % (ref 11.5–15.5)
WBC: 5.1 10*3/uL (ref 4.0–10.5)

## 2015-07-16 LAB — COMPREHENSIVE METABOLIC PANEL
ALK PHOS: 147 U/L — AB (ref 38–126)
ALT: 50 U/L (ref 17–63)
ANION GAP: 12 (ref 5–15)
AST: 80 U/L — AB (ref 15–41)
Albumin: 3.5 g/dL (ref 3.5–5.0)
BILIRUBIN TOTAL: 1.7 mg/dL — AB (ref 0.3–1.2)
BUN: 19 mg/dL (ref 6–20)
CALCIUM: 9.4 mg/dL (ref 8.9–10.3)
CO2: 21 mmol/L — ABNORMAL LOW (ref 22–32)
Chloride: 98 mmol/L — ABNORMAL LOW (ref 101–111)
Creatinine, Ser: 1.44 mg/dL — ABNORMAL HIGH (ref 0.61–1.24)
GFR calc Af Amer: 55 mL/min — ABNORMAL LOW (ref >60)
GFR, EST NON AFRICAN AMERICAN: 47 mL/min — AB (ref >60)
Glucose, Bld: 143 mg/dL — ABNORMAL HIGH (ref 65–99)
POTASSIUM: 4.4 mmol/L (ref 3.5–5.1)
Sodium: 131 mmol/L — ABNORMAL LOW (ref 135–145)
TOTAL PROTEIN: 7 g/dL (ref 6.5–8.1)

## 2015-07-16 MED ORDER — LACTULOSE ENEMA
300.0000 mL | Freq: Once | ORAL | Status: AC
  Administered 2015-07-16: 300 mL via RECTAL
  Filled 2015-07-16: qty 300

## 2015-07-16 MED ORDER — RIFAXIMIN 200 MG PO TABS
400.0000 mg | ORAL_TABLET | Freq: Three times a day (TID) | ORAL | Status: DC
  Administered 2015-07-16 – 2015-07-22 (×16): 400 mg via ORAL
  Filled 2015-07-16 (×21): qty 2

## 2015-07-16 NOTE — Progress Notes (Signed)
Family Medicine Teaching Service Daily Progress Note Intern Pager: [phone]  Patient name: [name] Medical record number: [id number] Date of birth: February 26, 1946 Age: 70 y.o. Gender: male  Primary Care Provider: Chesley Noon, MD Consultants: GI (signed off), CCM Code Status: Full  Pt Overview and Major Events to Date:  3/20 Admitted for AMS after abrupt cessation of ETOH. Treated as hepatic encephalopathy. 3/21 Became somnolent/obtunded. PCCM consulted for hypercapneic resp failure. Started on rocephin.  3/24 Attempted extubation, promptly failed / reintubated; GI signed off  3/26 Weaning on PSV  3/27  Extubated and doing well.  3/28  Rocephin stopped 3/29  Trialled off of BiPap last night; maintained oxygen saturations 3/30  Continue nightly BiPap  Assessment and Plan: [name] is a 70 y.o. male presenting with worsening encephalopathy. 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 He required intubation from 3/21-3/27 thought to be due to severe sleep apnea.   Encephalopathy: Still confused this morning with wandering thoughts and hallucinations. Likely related to hepatic encephalopathy. - Monitor respiratory status with continuous pulse oximetry - Continue nightly BiPAP and plan to continue at SNF  - Continue thiamine 100 mg daily - PT consult ordered --> recommending SNF; SW consult placed - Continue lactulose 30 mg TID - Start rifaximin 400mg  TID  EtOH cirrhosis: EtOH < 5, AST 256, ALT 66, Ammonia 96 on admission. s/p lactulose enema last night.  S/p TIPS.   - Continue lactulose 30 mg TID - Continue atenolol 25 mg daily  - rifaximin as above  AKI: Baseline ~ 1.2-1.3, at 1.44 today - Continue to monitor  Snoring at rest: Improved and then starting to worsen again overnight. s/p decadron 3/28 and solumedrol 143mcg  3/31. Likely severe sleep apnea - Will need sleep study outpatient as soon as possible. To page pulmonologist Dr. Corrie Dandy to help arrange sleep study once patient cleared for d/c.   Penile ulcer: Improving. Does not look infected.  No leukocytosis.  - RPR nonreactive, Gc/chlamydia negative  S/p esophagectomy: - Protonix   FEN/GI: Heart healthy diet Prophylaxis: heparin, protonix  Disposition: Pending SNF placement and ability to schedule sleep study. Pending improvement in mental status  Subjective:  Family reports that patient is incoherent this morning.  He will intermittently talk about things from his childhood and goes on tangents.  RN reports very small BM after enema last night   Objective: Temp:  [97.6 F (36.4 C)-98.3 F (36.8 C)] 98 F (36.7 C) (04/01 1200) Pulse Rate:  [68-87] 87 (04/01 1200) Resp:  [12-24] 18 (04/01 1200) BP: (122-152)/(83-99) 145/89 mmHg (04/01 1200) SpO2:  [96 %-100 %] 100 % (04/01 1200) Weight:  [217 lb 8 oz (98.657 kg)] 217 lb 8 oz (98.657 kg) (04/01 0328) Physical Exam: General: Chronically ill appearing man, alert, sitting up in bed Eyes: PERRL, EOMI, no ptosis ENTM: Mallampati Class 4, MMM Cardiovascular: RRR, S1, S2, no m/r/g Chest: Moving good air throughout all lung fields. Stridor heard from upper airway.   Abdomen: +BS, S, NT, distended and hyper-resonnant  Neuro: AOx2 (person, president but not place) and does often answer appropriately to direct questions; however, thoughts are very confused. There is some sentence structure to his speech but largely does not make sense. No asterixis. CNII-XII grossly intact.    Laboratory:  Recent Labs Lab 07/14/15 0805 07/15/15 0430 07/16/15 0308  WBC 7.3 6.1 5.1  HGB 9.9* 10.8* 12.3*  HCT 29.3* 32.1* 35.6*  PLT 110* 132* 144*    Recent Labs Lab 07/13/15 0420 07/14/15 0805 07/15/15 0430 07/16/15 0308  NA 138 136 135 131*  K 4.1 4.3 4.2 4.4  CL 107 101 99* 98*  CO2 21* 25 26 21*   BUN 27* 24* 21* 19  CREATININE 1.87* 1.60* 1.65* 1.44*  CALCIUM 8.9 9.4 9.6 9.4  PROT 6.2*  --   --  7.0  BILITOT 0.9  --   --  1.7*  ALKPHOS 98  --   --  147*  ALT 32  --   --  50  AST 48*  --   --  80*  GLUCOSE 108* 78 87 143*    Imaging/Diagnostic Tests: No results found.   Virginia Crews, MD 07/16/2015, 12:13 PM PGY-2, [place] Intern pager: [phone], text pages welcome

## 2015-07-16 NOTE — Progress Notes (Signed)
Placed patient on BIPAP, after 5 minutes on patient was pulling off mask and would not keep it on. Patient also did the same with his breathing treatment several times. Patient eventually finished tx. RN aware.

## 2015-07-16 NOTE — Progress Notes (Signed)
Notified Dr. Brita Romp about moderate amount of bloody secretions being suctioned from patient's oropharynx and snoring respirations.  Patient also required NT suctioning of bloody secretions. MD stated that patient has bitten his tongue which is quite swollen and continues to re-injure it. No new orders at this time except to cancel transfer to floor for now.  Will continue to monitor patient and suction airway as needed.

## 2015-07-17 ENCOUNTER — Inpatient Hospital Stay (HOSPITAL_COMMUNITY): Payer: Medicare HMO

## 2015-07-17 LAB — CBC
HEMATOCRIT: 33.3 % — AB (ref 39.0–52.0)
HEMOGLOBIN: 11.5 g/dL — AB (ref 13.0–17.0)
MCH: 33.5 pg (ref 26.0–34.0)
MCHC: 34.5 g/dL (ref 30.0–36.0)
MCV: 97.1 fL (ref 78.0–100.0)
Platelets: 132 10*3/uL — ABNORMAL LOW (ref 150–400)
RBC: 3.43 MIL/uL — ABNORMAL LOW (ref 4.22–5.81)
RDW: 15.2 % (ref 11.5–15.5)
WBC: 7.3 10*3/uL (ref 4.0–10.5)

## 2015-07-17 LAB — POCT I-STAT 3, ART BLOOD GAS (G3+)
ACID-BASE EXCESS: 1 mmol/L (ref 0.0–2.0)
BICARBONATE: 26.2 meq/L — AB (ref 20.0–24.0)
O2 Saturation: 94 %
TCO2: 28 mmol/L (ref 0–100)
pCO2 arterial: 43.5 mmHg (ref 35.0–45.0)
pH, Arterial: 7.388 (ref 7.350–7.450)
pO2, Arterial: 70 mmHg — ABNORMAL LOW (ref 80.0–100.0)

## 2015-07-17 LAB — BASIC METABOLIC PANEL
Anion gap: 10 (ref 5–15)
Anion gap: 12 (ref 5–15)
BUN: 19 mg/dL (ref 6–20)
BUN: 22 mg/dL — AB (ref 6–20)
CALCIUM: 9.7 mg/dL (ref 8.9–10.3)
CHLORIDE: 100 mmol/L — AB (ref 101–111)
CHLORIDE: 98 mmol/L — AB (ref 101–111)
CO2: 24 mmol/L (ref 22–32)
CO2: 22 mmol/L (ref 22–32)
CREATININE: 1.33 mg/dL — AB (ref 0.61–1.24)
Calcium: 9.1 mg/dL (ref 8.9–10.3)
Creatinine, Ser: 1.38 mg/dL — ABNORMAL HIGH (ref 0.61–1.24)
GFR calc Af Amer: 57 mL/min — ABNORMAL LOW (ref >60)
GFR calc Af Amer: >60 mL/min (ref >60)
GFR calc non Af Amer: 50 mL/min — ABNORMAL LOW (ref >60)
GFR calc non Af Amer: 52 mL/min — ABNORMAL LOW (ref >60)
GLUCOSE: 86 mg/dL (ref 65–99)
Glucose, Bld: 120 mg/dL — ABNORMAL HIGH (ref 65–99)
POTASSIUM: 4.7 mmol/L (ref 3.5–5.1)
Potassium: 4.2 mmol/L (ref 3.5–5.1)
SODIUM: 134 mmol/L — AB (ref 135–145)
Sodium: 132 mmol/L — ABNORMAL LOW (ref 135–145)

## 2015-07-17 LAB — LACTIC ACID, PLASMA: Lactic Acid, Venous: 2.9 mmol/L (ref 0.5–2.0)

## 2015-07-17 MED ORDER — HALOPERIDOL LACTATE 5 MG/ML IJ SOLN
INTRAMUSCULAR | Status: AC
  Filled 2015-07-17: qty 1

## 2015-07-17 MED ORDER — VANCOMYCIN HCL 10 G IV SOLR
2000.0000 mg | Freq: Once | INTRAVENOUS | Status: AC
  Administered 2015-07-18: 2000 mg via INTRAVENOUS
  Filled 2015-07-17: qty 2000

## 2015-07-17 MED ORDER — ATENOLOL 12.5 MG HALF TABLET: 12.5000 mg | ORAL_TABLET | Freq: Every day | ORAL | Status: DC

## 2015-07-17 MED ORDER — SODIUM CHLORIDE 0.9 % IV BOLUS (SEPSIS)
1000.0000 mL | Freq: Once | INTRAVENOUS | Status: AC
  Administered 2015-07-18: 1000 mL via INTRAVENOUS

## 2015-07-17 MED ORDER — LACTULOSE 10 GM/15ML PO SOLN
30.0000 g | Freq: Three times a day (TID) | ORAL | Status: DC
  Administered 2015-07-18 – 2015-07-22 (×13): 30 g via ORAL
  Filled 2015-07-17 (×14): qty 45

## 2015-07-17 MED ORDER — VANCOMYCIN HCL IN DEXTROSE 1-5 GM/200ML-% IV SOLN
1000.0000 mg | Freq: Two times a day (BID) | INTRAVENOUS | Status: DC
  Administered 2015-07-18 – 2015-07-20 (×4): 1000 mg via INTRAVENOUS
  Filled 2015-07-17 (×5): qty 200

## 2015-07-17 MED ORDER — DEXTROSE 50 % IV SOLN
INTRAVENOUS | Status: AC
  Administered 2015-07-17: 50 mL
  Filled 2015-07-17: qty 50

## 2015-07-17 MED ORDER — PIPERACILLIN-TAZOBACTAM 3.375 G IVPB
3.3750 g | Freq: Three times a day (TID) | INTRAVENOUS | Status: DC
  Administered 2015-07-18 – 2015-07-20 (×7): 3.375 g via INTRAVENOUS
  Filled 2015-07-17 (×10): qty 50

## 2015-07-17 MED ORDER — HALOPERIDOL LACTATE 5 MG/ML IJ SOLN
1.0000 mg | Freq: Four times a day (QID) | INTRAMUSCULAR | Status: DC | PRN
  Administered 2015-07-17: 1 mg via INTRAVENOUS
  Filled 2015-07-17: qty 1

## 2015-07-17 MED ORDER — LACTULOSE ENEMA
300.0000 mL | Freq: Three times a day (TID) | ORAL | Status: DC
  Administered 2015-07-17 (×2): 300 mL via RECTAL
  Filled 2015-07-17 (×15): qty 300

## 2015-07-17 MED ORDER — DEXTROSE-NACL 5-0.45 % IV SOLN
INTRAVENOUS | Status: DC
  Administered 2015-07-17: 75 mL via INTRAVENOUS

## 2015-07-17 MED ORDER — HALOPERIDOL LACTATE 5 MG/ML IJ SOLN
2.0000 mg | Freq: Four times a day (QID) | INTRAMUSCULAR | Status: DC | PRN
  Administered 2015-07-17: 2 mg via INTRAVENOUS

## 2015-07-17 NOTE — Progress Notes (Signed)
Family Medicine Teaching Service Daily Progress Note Intern Pager: [phone]  Patient name: [name] Medical record number: [id number] Date of birth: April 23, 1945 Age: 70 y.o. Gender: male  Primary Care Provider: Chesley Noon, MD Consultants: GI (signed off), CCM Code Status: Full  Pt Overview and Major Events to Date:  3/20 Admitted for AMS after abrupt cessation of ETOH. Treated as hepatic encephalopathy. 3/21 Became somnolent/obtunded. PCCM consulted for hypercapneic resp failure. Started on rocephin.  3/24 Attempted extubation, promptly failed / reintubated; GI signed off  3/26 Weaning on PSV  3/27  Extubated and doing well.  3/28  Rocephin stopped 3/29  Trialled off of BiPap last night; maintained oxygen saturations 3/30  Continue nightly BiPap  Assessment and Plan: [name] is a 70 y.o. male presenting with worsening encephalopathy. 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 He required intubation from 3/21-3/27 thought to be due to severe sleep apnea.   Encephalopathy: Continues to be confused with wandering thoughts and occasional hallucinations. Likely related to hepatic encephalopathy. Also consider Wernicke's encephalopathy.  - Monitor respiratory status with continuous pulse oximetry - Continue nightly BiPAP and plan to continue at SNF  - Continue thiamine 100 mg daily - PT consult ordered --> recommending SNF; SW consult placed - Continue lactulose 30 mg TID - Continue rifaximin 400mg  TID  EtOH cirrhosis: EtOH < 5, AST 256, ALT 66, Ammonia 96 on admission. S/p TIPS.   - Continue lactulose 30 mg TID - Continue atenolol 25 mg daily  - rifaximin as above  AKI: Baseline ~ 1.2-1.3. Improving.  - Continue to monitor - Avoid nephrotoxic medications  Snoring at rest: Improved and then starting to worsen again  overnight. s/p decadron 3/28 and solumedrol 113mcg 3/31. Likely severe sleep apnea - Will need sleep study outpatient as soon as possible. To page pulmonologist Dr. Corrie Dandy to help arrange sleep study once patient cleared for d/c.   Penile ulcer: Improving. Does not look infected.  No leukocytosis.  - RPR nonreactive, Gc/chlamydia negative  S/p esophagectomy: - Protonix   FEN/GI: Heart healthy diet Prophylaxis: heparin, protonix  Disposition: Pending SNF placement and ability to schedule sleep study. Pending improvement in mental status  Subjective:  No family at bedside this morning. Oriented to place and person. No complaints.   Objective: Temp:  [97.5 F (36.4 C)-98 F (36.7 C)] 97.5 F (36.4 C) (04/02 0342) Pulse Rate:  [64-87] 64 (04/02 0754) Resp:  [14-20] 14 (04/02 0754) BP: (145-170)/(88-97) 170/88 mmHg (04/02 0400) SpO2:  [95 %-100 %] 97 % (04/02 0754) Weight:  [211 lb (95.709 kg)] 211 lb (95.709 kg) (04/02 0342) Physical Exam: General: Chronically ill appearing man, sleeping in bed, easily aroused. Eyes: PERRL, EOMI, no ptosis ENTM: Mallampati Class 4, MMM Cardiovascular: RRR, S1, S2, no m/r/g Chest: Moving good air throughout all lung fields.  Abdomen: +BS, S, NT, distended and hyper-resonnant  Neuro: AOx2 (person and place) and does often answer appropriately to direct questions; however, thoughts are very confused. There is some sentence structure to his speech but largely does not make sense. No asterixis. CNII-XII grossly intact.    Laboratory:  Recent Labs Lab 07/15/15 0430 07/16/15 0308 07/17/15 0234  WBC 6.1 5.1 7.3  HGB 10.8* 12.3* 11.5*  HCT 32.1* 35.6* 33.3*  PLT 132* 144* 132*    Recent Labs Lab 07/13/15 0420  07/15/15 0430 07/16/15 0308 07/17/15 0234  NA 138  < >  135 131* 134*  K 4.1  < > 4.2 4.4 4.2  CL 107  < > 99* 98* 100*  CO2 21*  < > 26 21* 24  BUN 27*  < > 21* 19 19  CREATININE 1.87*  < > 1.65* 1.44* 1.38*  CALCIUM 8.9  < >  9.6 9.4 9.7  PROT 6.2*  --   --  7.0  --   BILITOT 0.9  --   --  1.7*  --   ALKPHOS 98  --   --  147*  --   ALT 32  --   --  50  --   AST 48*  --   --  80*  --   GLUCOSE 108*  < > 87 143* 86  < > = values in this interval not displayed.  Imaging/Diagnostic Tests: No results found.   Vivi Barrack, MD 07/17/2015, 8:04 AM PGY-2, [place] Intern pager: [phone], text pages welcome

## 2015-07-17 NOTE — Progress Notes (Addendum)
[id number] AM-Patient repeatedly trying to get out of bed.  Throwing legs in the air and over siderails repeatedly.  Found with feet on the floor at foot of bed attempting to "get out of here".  3 staff members needed to get him back to bed as he struggled.  He is unable to understand conversation and we have made no headway in reasoning with him. Attempts at diversion unsuccessful.  Food, drink and toileting offered.   Bed alarms on, request made to physician for medication and a safety sitter for patient and staff safety.  Order received for Haldol and sitter.  0515 AM: good results from Haldol.  He is sleeping and maintaining his oxygen saturations.  Stirs to touch and sound.

## 2015-07-17 NOTE — Progress Notes (Addendum)
Notifed md about pt's temp still low 35.3.  Pt's room is warmer and lots of blankets on... Temp foley placed earlier.  Will order and apply warming blanket and obtain lactic acid and bmet.  Will  continue to monitor. Saunders Revel T

## 2015-07-17 NOTE — Progress Notes (Signed)
Md notified about pts rectal temp of 95.7.  Room cold, pt uncovered.  Turned heat up in room applied warm blankets.  Checked CBG = 67 gave amp D5 and started D51/2 ns at 75.  Will check cbgs q4.  Will place foley catheter for temp monitoring. Monitor pt closely.  Pt does follow command.  Saunders Revel T

## 2015-07-17 NOTE — Progress Notes (Signed)
Pharmacy Antibiotic Note  [name] is a 70 y.o. male with hypothermia/elevated lactic acid, and possible sepsis  .  Pharmacy has been consulted for Vancomycin and Zosyn  dosing.  Plan: Vancomycin 2000 mg IV now, then 1 g IV q12h Zosyn 3.375 g IV q8h     Height: 6\' 2"  (188 cm) Weight: 211 lb (95.709 kg) IBW/kg (Calculated) : 82.2  Temp (24hrs), Avg:95.9 F (35.5 C), Min:93.7 F (34.3 C), Max:97.5 F (36.4 C)   Recent Labs Lab 07/13/15 0420 07/14/15 0805 07/15/15 0430 07/16/15 0308 07/17/15 0234 07/17/15 2235  WBC 7.1 7.3 6.1 5.1 7.3  --   CREATININE 1.87* 1.60* 1.65* 1.44* 1.38* 1.33*  LATICACIDVEN  --   --   --   --   --  2.9*    Estimated Creatinine Clearance: 58.4 mL/min (by C-G formula based on Cr of 1.33).    Allergies  Allergen Reactions  . Amoxicillin Diarrhea    Antimicrobials this admission: Rocephin 3/21 >> 3/28 Vancomycin 4/3 >>  Zosyn 4/3 >>   Caryl Pina 07/17/2015 11:48 PM

## 2015-07-17 NOTE — Progress Notes (Signed)
Lactic acid now 2.9 Md notified.  New orders placed.  Will continue to monitor Saunders Revel T

## 2015-07-17 NOTE — Progress Notes (Signed)
52fr foley with temp placed using sterile tech.  1350 amber urine noted after foley place.   Pt temps 95,  Will continue to monitor. Saunders Revel T

## 2015-07-18 ENCOUNTER — Inpatient Hospital Stay (HOSPITAL_COMMUNITY): Payer: Medicare HMO

## 2015-07-18 DIAGNOSIS — G9340 Encephalopathy, unspecified: Secondary | ICD-10-CM | POA: Diagnosis present

## 2015-07-18 DIAGNOSIS — J189 Pneumonia, unspecified organism: Secondary | ICD-10-CM

## 2015-07-18 DIAGNOSIS — A419 Sepsis, unspecified organism: Secondary | ICD-10-CM

## 2015-07-18 LAB — CBC
HEMATOCRIT: 26.4 % — AB (ref 39.0–52.0)
HEMOGLOBIN: 8.9 g/dL — AB (ref 13.0–17.0)
MCH: 33.2 pg (ref 26.0–34.0)
MCHC: 33.7 g/dL (ref 30.0–36.0)
MCV: 98.5 fL (ref 78.0–100.0)
Platelets: 108 10*3/uL — ABNORMAL LOW (ref 150–400)
RBC: 2.68 MIL/uL — ABNORMAL LOW (ref 4.22–5.81)
RDW: 15.3 % (ref 11.5–15.5)
WBC: 6.3 10*3/uL (ref 4.0–10.5)

## 2015-07-18 LAB — BASIC METABOLIC PANEL
Anion gap: 9 (ref 5–15)
BUN: 19 mg/dL (ref 6–20)
CALCIUM: 8.1 mg/dL — AB (ref 8.9–10.3)
CHLORIDE: 106 mmol/L (ref 101–111)
CO2: 22 mmol/L (ref 22–32)
CREATININE: 1.32 mg/dL — AB (ref 0.61–1.24)
GFR calc Af Amer: >60 mL/min (ref >60)
GFR calc non Af Amer: 52 mL/min — ABNORMAL LOW (ref >60)
Glucose, Bld: 78 mg/dL (ref 65–99)
Potassium: 3.1 mmol/L — ABNORMAL LOW (ref 3.5–5.1)
Sodium: 137 mmol/L (ref 135–145)

## 2015-07-18 LAB — POCT I-STAT 3, ART BLOOD GAS (G3+)
ACID-BASE DEFICIT: 1 mmol/L (ref 0.0–2.0)
BICARBONATE: 23.4 meq/L (ref 20.0–24.0)
O2 SAT: 100 %
TCO2: 24 mmol/L (ref 0–100)
pCO2 arterial: 35.8 mmHg (ref 35.0–45.0)
pH, Arterial: 7.42 (ref 7.350–7.450)
pO2, Arterial: 417 mmHg — ABNORMAL HIGH (ref 80.0–100.0)

## 2015-07-18 LAB — URINALYSIS, ROUTINE W REFLEX MICROSCOPIC
BILIRUBIN URINE: NEGATIVE
Glucose, UA: NEGATIVE mg/dL
HGB URINE DIPSTICK: NEGATIVE
Ketones, ur: NEGATIVE mg/dL
Leukocytes, UA: NEGATIVE
NITRITE: NEGATIVE
PH: 6.5 (ref 5.0–8.0)
Protein, ur: NEGATIVE mg/dL
SPECIFIC GRAVITY, URINE: 1.016 (ref 1.005–1.030)

## 2015-07-18 LAB — GLUCOSE, CAPILLARY
GLUCOSE-CAPILLARY: 67 mg/dL (ref 65–99)
GLUCOSE-CAPILLARY: 83 mg/dL (ref 65–99)
Glucose-Capillary: 147 mg/dL — ABNORMAL HIGH (ref 65–99)
Glucose-Capillary: 82 mg/dL (ref 65–99)
Glucose-Capillary: 88 mg/dL (ref 65–99)
Glucose-Capillary: 94 mg/dL (ref 65–99)
Glucose-Capillary: 96 mg/dL (ref 65–99)

## 2015-07-18 LAB — LACTIC ACID, PLASMA
LACTIC ACID, VENOUS: 2.6 mmol/L — AB (ref 0.5–2.0)
Lactic Acid, Venous: 1.6 mmol/L (ref 0.5–2.0)

## 2015-07-18 LAB — CORTISOL: Cortisol, Plasma: 11.7 ug/dL

## 2015-07-18 LAB — OCCULT BLOOD X 1 CARD TO LAB, STOOL: Fecal Occult Bld: NEGATIVE

## 2015-07-18 MED ORDER — SODIUM CHLORIDE 0.9 % IV BOLUS (SEPSIS)
1000.0000 mL | Freq: Once | INTRAVENOUS | Status: AC
  Administered 2015-07-18: 1000 mL via INTRAVENOUS

## 2015-07-18 MED ORDER — DEXTROSE-NACL 5-0.9 % IV SOLN
INTRAVENOUS | Status: DC
  Administered 2015-07-18: 125 mL via INTRAVENOUS
  Administered 2015-07-20: via INTRAVENOUS

## 2015-07-18 MED ORDER — POTASSIUM CHLORIDE 10 MEQ/100ML IV SOLN
10.0000 meq | INTRAVENOUS | Status: AC
  Administered 2015-07-18 (×4): 10 meq via INTRAVENOUS
  Filled 2015-07-18 (×4): qty 100

## 2015-07-18 MED ORDER — CHLORHEXIDINE GLUCONATE 0.12 % MT SOLN
15.0000 mL | Freq: Two times a day (BID) | OROMUCOSAL | Status: DC
  Administered 2015-07-18 – 2015-07-22 (×10): 15 mL via OROMUCOSAL
  Filled 2015-07-18 (×8): qty 15

## 2015-07-18 MED ORDER — SODIUM CHLORIDE 0.9 % IV BOLUS (SEPSIS)
2000.0000 mL | Freq: Once | INTRAVENOUS | Status: AC
  Administered 2015-07-18: 2000 mL via INTRAVENOUS

## 2015-07-18 MED ORDER — CETYLPYRIDINIUM CHLORIDE 0.05 % MT LIQD
7.0000 mL | Freq: Two times a day (BID) | OROMUCOSAL | Status: DC
  Administered 2015-07-18 – 2015-07-22 (×9): 7 mL via OROMUCOSAL

## 2015-07-18 MED ORDER — MIDODRINE HCL 5 MG PO TABS
5.0000 mg | ORAL_TABLET | Freq: Three times a day (TID) | ORAL | Status: DC
  Administered 2015-07-18 – 2015-07-22 (×13): 5 mg via ORAL
  Filled 2015-07-18 (×12): qty 1

## 2015-07-18 NOTE — Consult Note (Signed)
PULMONARY / CRITICAL CARE MEDICINE CONSULT NOTE  Name: [name] MRN: [id number] DOB: 1945-11-04    ADMISSION DATE:  07/04/2015 CONSULTATION DATE:  07/18/15  REFERRING MD:  Dr. Andrena Mews  CHIEF COMPLAINT:  Hypotension  HISTORY OF PRESENT ILLNESS:   [name] is a 70 y.o. male with alcoholic cirrhosis s/p TIPS and esophageal cancer s/p esophagectomy admitted on 07/04/15 for altered mental status after abruptly stopping alcohol. He was intubated for hypercapneic respiratory failure on 3/21 and admitted to the Medical ICU for treatment of complicated EtOH withdrawal and hepatic encephalopathy. Initial sepsis workup was negative and he was treated with seven days of Ceftriaxone. He was extubated on 3/27 but continued to have problems with confusion and drowsiness.   Earlier this evening, he was noted to have a glucose of 60 and given dextrose. His nurse had a difficult time obtaining a temperature and was able to find a temp of 35C after placement of a urethral catheter. He had brisk drainage of 1374mL of urine after Foley placement. He then became hypotensive with BPs reaching 70/50s. The primary team started him on normal saline boluses and empiric vancomycin and zosyn and sent septic work up.   PAST MEDICAL HISTORY :  He  has a past medical history of Hypertension; History of cancer chemotherapy; radiation therapy (2002); Gout; Umbilical hernia; Fatty liver; Esophageal cancer ([place]) (2002); Shortness of breath dyspnea; GERD (gastroesophageal reflux disease); and Alcoholic cirrhosis ([place]).  PAST SURGICAL HISTORY: He  has past surgical history that includes Esophagus surgery (2002); Incisional hernia repair (2004); Tonsillectomy (as child); Umbilical hernia repair (N/A, 06/14/2014); Tonsillectomy; and Radiology with anesthesia (N/A, 02/09/2015).  Allergies  Allergen Reactions  . Amoxicillin Diarrhea    No current facility-administered medications on file prior to encounter.    Current Outpatient Prescriptions on File Prior to Encounter  Medication Sig  . allopurinol (ZYLOPRIM) 300 MG tablet Take 300 mg by mouth daily.   Marland Kitchen atenolol (TENORMIN) 50 MG tablet Take 50 mg by mouth every morning.  Marland Kitchen ibuprofen (ADVIL,MOTRIN) 200 MG tablet Take 400 mg by mouth every 6 (six) hours as needed for headache or moderate pain.  Marland Kitchen omeprazole (PRILOSEC) 20 MG capsule Take 20 mg by mouth daily.     FAMILY HISTORY:  His has no family status information on file.   SOCIAL HISTORY: He  reports that he quit smoking about 11 years ago. His smoking use included Cigarettes. He has a 20 pack-year smoking history. He has never used smokeless tobacco. He reports that he drinks about 4.2 oz of alcohol per week. He reports that he does not use illicit drugs.  REVIEW OF SYSTEMS:   Full review of systems unable to be obtained due to confusion. He reports no discomfort or pain anywhere currently.    VITAL SIGNS: BP 94/64 mmHg  Pulse 64  Temp(Src) 95.5 F (35.3 C) (Oral)  Resp 12  Ht 6\' 2"  (1.88 m)  Wt 95.709 kg (211 lb)  BMI 27.08 kg/m2  SpO2 97%  INTAKE / OUTPUT: I/O last 3 completed shifts: In: 509 [P.O.:509] Out: 300 [Urine:300]  PHYSICAL EXAMINATION: General:  Sleeping heavily with BiPAP but arousable with physical stimuli, oriented to person and place alone Neuro:  Moves all extremities with good strength HEENT:  MMM, Bipap in place Cardiovascular:  RRR, no murmurs rubs or gallops Lungs:  CTAB Abdomen:  Soft, distended, nontender to palpation Musculoskeletal:  No abnormalities Skin:  Scattered red macules over trunk (chronic)  LABS:  BMET  Recent  Labs Lab 07/16/15 0308 07/17/15 0234 07/17/15 2235  NA 131* 134* 132*  K 4.4 4.2 4.7  CL 98* 100* 98*  CO2 21* 24 22  BUN 19 19 22*  CREATININE 1.44* 1.38* 1.33*  GLUCOSE 143* 86 120*    Electrolytes  Recent Labs Lab 07/16/15 0308 07/17/15 0234 07/17/15 2235  CALCIUM 9.4 9.7 9.1    CBC  Recent  Labs Lab 07/15/15 0430 07/16/15 0308 07/17/15 0234  WBC 6.1 5.1 7.3  HGB 10.8* 12.3* 11.5*  HCT 32.1* 35.6* 33.3*  PLT 132* 144* 132*    Coag's  Recent Labs Lab 07/15/15 0430  INR 1.13    Sepsis Markers  Recent Labs Lab 07/17/15 2235 07/17/15 2355  LATICACIDVEN 2.9* 2.6*    ABG  Recent Labs Lab 07/14/15 1037 07/15/15 1749 07/17/15 1306  PHART 7.506* 7.480* 7.388  PCO2ART 31.4* 33.8* 43.5  PO2ART 75.0* 70.5* 70.0*    Liver Enzymes  Recent Labs Lab 07/13/15 0420 07/16/15 0308  AST 48* 80*  ALT 32 50  ALKPHOS 98 147*  BILITOT 0.9 1.7*  ALBUMIN 3.0* 3.5    Cardiac Enzymes No results for input(s): TROPONINI, PROBNP in the last 168 hours.  Glucose  Recent Labs Lab 07/11/15 0900 07/11/15 1201 07/11/15 1705 07/17/15 2028 07/18/15 0002 07/18/15 0258  GLUCAP 117* 125* 111* 67 96 83    Imaging Dg Chest Port 1 View  07/18/2015  CLINICAL DATA:  Hypothermia EXAM: PORTABLE CHEST 1 VIEW COMPARISON:  07/11/2015 FINDINGS: Cardiac shadow is enlarged but stable. The endotracheal tube is been removed. Lungs are clear bilaterally. No acute bony abnormality is noted. IMPRESSION: No acute abnormality seen. Electronically Signed   By: Inez Catalina M.D.   On: 07/18/2015 00:30    CULTURES: 3/21 blood cultures negative  ANTIBIOTICS: Vanc and Zosyn   LINES/TUBES: Foley 2 PIVs  DISCUSSION: [name] is a 70 y.o. male with alcoholic cirrhosis s/p TIPS and esophageal cancer s/p esophagectomy admitted on 07/04/15 for altered mental status after abruptly stopping alcohol who was intubated in the setting of hepatic encephalopathy and presumed complicated EtOH withdrawal. He is newly hypoglycemic, hypotensive and hypothermic concerning for developing sepsis. Urinanalysis negative for WBCs but he seems to have been having urinary retention with 1.3L in his bladder. CXR clear and no respiratory complaints. No indwelling lines or tubes aside from new foley. SBP  always a consideration in light of his cirrhosis, but abdomen is not tender. Cardiogenic shock unlikely as heart has been good on recent echos. Adrenal insufficiency is a consideration. No signs of hemorrhagic shock.  ASSESSMENT / PLAN:  PULMONARY A: OSA P:   Continue bipap with sleep  CARDIOVASCULAR A:  Hypotension - Likely sepsis and is responding to fluids. Adrenal insufficiency can be considered. Echo recently had normal EF.  P:  Continue fluid resuscitation (68mL/kg is reasonable as he is responding) Hold antihypertensive medications If hypotension persists, will transfer to MICU for vasopressor support  RENAL A:   Urinary retention - unknown cause, may be medicine side effect versus bladder outlet obstruction. Neurologic syndrome also consideration with suspected cervical spinal stenosis on recent MRI P:   Continue foley catheter for now   INFECTIOUS A:   Presumed sepsis - unlikely urinary or pulmonary. SBP possible. Blood and urine cultures pending.  P:   - Follow up cultures - Fluid resusitation - Vanc and Zosyn are reasonable  ENDOCRINE A:   Hypoglycemia - likely from sepsis. Consider relative adrenal insufficiency P:   Monitor labs  NEUROLOGIC A:   Encephalopathy - believed to be combination of nutritional deficiency and hepatic encephalopathy. Urinary retention may signal spinal cord disease if no other cause identified P:   Continue anti-delirium treatment   Pulmonary and [place] Pager: [phone]  07/18/2015, 3:31 AM

## 2015-07-18 NOTE — Progress Notes (Signed)
Pt's sbp 71's Md notified.  Md at bedside.  CCM contacted.  Seen pt.  3L ns bolus to give.  Will obtain bcx2, pcxr, Ua, start antbx.  Will continue to monitor.

## 2015-07-18 NOTE — Progress Notes (Signed)
Lengthy d/w wife re: current findings. Expected course of care and treatment recommendations should we decide to be aggressive. She states he would not want these aggressive interventions. Based on our discussion we will continue current therapy w/ Oxygen, antibiotics, IV fluids and CPAP at HS. However should he deteriorate he will be FULL DNR. We will NOT intubate or offer mechanical ventilation NOR will we offer central access or pressors. I have told her I think he would be an excellent hospice candidate which she is open to.   Plan/rec Full DNR, no intubation, no pressors Continue conservative rx w/ IVFs and abx CPAP at HS ok Aspiration precautions Would recommend palliative care consult We will s/o   Erick Colace ACNP-BC [place] Pager # [phone] OR # [phone] if no answer

## 2015-07-18 NOTE — Progress Notes (Signed)
Family Medicine Teaching Service Daily Progress Note Intern Pager: [phone]  Patient name: [name] Medical record number: [id number] Date of birth: 1945-06-09 Age: 70 y.o. Gender: male  Primary Care Provider: Chesley Noon, MD Consultants: GI (signed off), CCM Code Status: Full  Pt Overview and Major Events to Date:  3/20 Admitted for AMS after abrupt cessation of ETOH. Treated as hepatic encephalopathy. 3/21 Became somnolent/obtunded. PCCM consulted for hypercapneic resp failure. Started on rocephin.  3/24 Attempted extubation, promptly failed / reintubated; GI signed off  3/26 Weaning on PSV  3/27  Extubated and doing well.  3/28  Rocephin stopped 3/29  Trialled off of BiPap last night; maintained oxygen saturations 3/30  Continue nightly BiPap 4/2  Sepsis. DNR   Assessment and Plan: [name] is a 70 y.o. male presenting with worsening encephalopathy. 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 He required intubation from 3/21-3/27 thought to be due to severe sleep apnea.    Sepsis: 2/4 on SIRs (with hypothermia and hypotension). Lactic acid elevated to 2.9 but normalized on repeat. CXR concerning for aspirational pneumonia/atelectasis. Other possible source SBP and UTI. -Consulted CCM but patient DNR now -Continue Vanc and Zosyn pending blood and urine cultures -Bolus NS for now -Midodrine 5 mg twice a day  -D/cd atenolol for low BP's -Monitor respiratory status with continuous pulse oximetry -Continue nightly BiPAP and plan to continue at SNF  -PT consulted and recommended SNF. But patient DNR now, so candidate for hospice  Encephalopathy:improved. Patient AAO x4. Likely related to hepatic encephalopathy with TIPs. Also consider Wernicke's encephalopathy.  - Continue thiamine 100 mg daily - Continue lactulose 30 mg  TID - Continue rifaximin 400mg  TID  EtOH cirrhosis: MELD-Na score 14, 6% mortality within 3 months. EtOH < 5, AST 256, ALT 66, Ammonia 96 on admission. S/p TIPS.   - Continue lactulose and rifaximin as above - Hold atenolol for now  AKI: 1.32. Baseline ~ 1.2-1.3.  - Continue to monitor - Avoid nephrotoxic medications  Snoring at rest: likely severe sleep apnea - Consider sleep study outpatient. To page pulmonologist Dr. Corrie Dandy to help arrange sleep study once patient cleared for d/c.   Penile ulcer: Improving. Does not look infected.  No leukocytosis.  - RPR nonreactive, Gc/chlamydia negative  S/p esophagectomy: - Protonix   FEN/GI:  -NPO pending SLP eval -NS bolus, then maintenance  Prophylaxis: SCD given thrmbocytopenia  Disposition: step-down pending clinical improvement. Final dispo likely hospice.  Subjective:  Wife at bedside. He is sitting on the chair. Denies pain or other symptoms.   Objective: Temp:  [93.7 F (34.3 C)-97.9 F (36.6 C)] 96.6 F (35.9 C) (04/03 1130) Pulse Rate:  [49-81] 59 (04/03 1130) Resp:  [11-23] 13 (04/03 1130) BP: (70-132)/(43-81) 86/62 mmHg (04/03 1130) SpO2:  [90 %-100 %] 100 % (04/03 1130) Weight:  [214 lb 8 oz (97.297 kg)] 214 lb 8 oz (97.297 kg) (04/03 0330) Physical Exam: General: sitting on chair, responding to questions appropriately Eyes: PERRL, EOMI, no ptosis ENTM: Mallampati Class 4, MMM Cardiovascular: RRR, S1, S2 Chest: Moving good air throughout all lung fields.  Abdomen: +BS, S, NT, full but hard to fully assess on chair Neuro: AOx4   Laboratory:  Recent Labs Lab 07/16/15 0308 07/17/15 0234 07/18/15 0301  WBC 5.1 7.3 6.3  HGB 12.3* 11.5* 8.9*  HCT 35.6* 33.3* 26.4*  PLT 144* 132* 108*  Recent Labs Lab 07/13/15 0420  07/16/15 0308 07/17/15 0234 07/17/15 2235 07/18/15 0301  NA 138  < > 131* 134* 132* 137  K 4.1  < > 4.4 4.2 4.7 3.1*  CL 107  < > 98* 100* 98* 106  CO2 21*  < > 21* 24 22 22   BUN  27*  < > 19 19 22* 19  CREATININE 1.87*  < > 1.44* 1.38* 1.33* 1.32*  CALCIUM 8.9  < > 9.4 9.7 9.1 8.1*  PROT 6.2*  --  7.0  --   --   --   BILITOT 0.9  --  1.7*  --   --   --   ALKPHOS 98  --  147*  --   --   --   ALT 32  --  50  --   --   --   AST 48*  --  80*  --   --   --   GLUCOSE 108*  < > 143* 86 120* 78  < > = values in this interval not displayed.  Imaging/Diagnostic Tests: Dg Chest Port 1 View  07/18/2015  CLINICAL DATA:  Desaturation, history of congestive heart failure EXAM: PORTABLE CHEST 1 VIEW COMPARISON:  07/18/2015 at 12:01 a.m. FINDINGS: Cardiac enlargement stable. Mild hazy perihilar opacity bilaterally. Vascular pattern is normal. No definite pleural effusion. IMPRESSION: Mild perihilar haziness. In the absence of vascular congestion or interstitial edema it is felt this is more likely due to atelectasis rather than pulmonary edema but follow-up is recommended. Electronically Signed   By: Skipper Cliche M.D.   On: 07/18/2015 07:15   Dg Chest Port 1 View  07/18/2015  CLINICAL DATA:  Hypothermia EXAM: PORTABLE CHEST 1 VIEW COMPARISON:  07/11/2015 FINDINGS: Cardiac shadow is enlarged but stable. The endotracheal tube is been removed. Lungs are clear bilaterally. No acute bony abnormality is noted. IMPRESSION: No acute abnormality seen. Electronically Signed   By: Inez Catalina M.D.   On: 07/18/2015 00:30     Mercy Riding, MD 07/18/2015, 11:41 AM PGY-1, [place] Intern pager: [phone], text pages welcome

## 2015-07-18 NOTE — Progress Notes (Signed)
Pts wife called and informed pt moving to ICU 2h13.  Saunders Revel T

## 2015-07-18 NOTE — Progress Notes (Signed)
Md paged sbp 68's pt still c/o sob.  md to floor.  Ccm contacted.  New orders received. Will continue to monitor Saunders Revel T

## 2015-07-18 NOTE — Progress Notes (Signed)
FMTS Interim Progress Note  Patient's status has continued to decline over the past several hours and has now developed hypothermia, lactic acidosis to 2.9, and hypotension to 70s/50s.   Blood pressure 87/54, pulse 68, temperature 95.7 F (35.4 C), temperature source Oral, resp. rate 11, height 6\' 2"  (1.88 m), weight 211 lb (95.709 kg), SpO2 96 %. GEN: Sleeping in bed, BiPAP in place.  CV: RRR Chest: Good air movement  Plan: - 30cc/kg bolus NS now - Trend lactic acid - Blood culture, urine culture, CXR - Start vanc/zosyn - CCM consulted, appreciate assistance.   [name]. [name], [place] Resident PGY-2 07/18/2015 12:43 AM

## 2015-07-18 NOTE — Care Management Important Message (Signed)
Important Message  Patient Details  Name: [name] MRN: [id number] Date of Birth: [birth year]   Medicare Important Message Given:  Yes    Barb Merino [place] Deleo 07/18/2015, 12:42 PM

## 2015-07-18 NOTE — Progress Notes (Signed)
Md called about pt sob and low Urine output.  sats dropping to 88 -90 on cpap.  No new orders at this time.  Will continue to monitor. Saunders Revel T

## 2015-07-18 NOTE — Progress Notes (Signed)
Speech Language Pathology Dysphagia Treatment Patient Details Name: Byard Carranza MRN: [id number] DOB: 12/30/1945 Today's Date: 07/18/2015 Time: 4199-1444 SLP Time Calculation (min) (ACUTE ONLY): 21 min  Assessment / Plan / Recommendation Clinical Impression    Pt exhibited immediate coughing x1 with cracker, likely due to talking while attempting to swallow. No other s/s of penetration/aspiration observed. Pt is at increased aspiration risk due to s/p total esophagectomy. Pt educated re: esophageal precautions (sit upright during meals, stay upright at least 45 minutes after meals, alternate solids and liquids) to mitigate possible reflux. No further SLP intervention necessary.    Diet Recommendation    Regular diet, thin liquids, meds whole with liquid   SLP Plan All goals met      Swallowing Goals     General Behavior/Cognition: Alert;Cooperative;Confused;Pleasant mood;Requires cueing Patient Positioning: Upright in chair/Tumbleform Oral care provided: N/A HPI: 70 y/o male with PMH alcoholic liver cirrhosis s/p TIPS, history of Esophogeal SCC s/p total esophagectomy, HTN, and Gout presents with worsening encephalopathy/AMS. His wife has noted increased hallucinations, altered mental status, and unsteadiness while ambulating over the past few days. CXR shows bibasilar atelectasis. MRI shows no acute infarct or intracranial hemorrhage.  Oral Cavity - Oral Hygiene     Dysphagia Treatment Treatment Methods: Skilled observation;Compensation strategy training;Patient/caregiver education Patient observed directly with PO's: Yes Type of PO's observed: Regular;Thin liquids Feeding: Able to feed self;Needs set up Liquids provided via: Cup;Straw Oral Phase Signs & Symptoms:  (none) Pharyngeal Phase Signs & Symptoms: Immediate cough (x1) Type of cueing: Verbal Amount of cueing: Minimal   Titus Mould, Student-SLP      Shadman Tozzi 07/18/2015, 2:46 PM

## 2015-07-18 NOTE — Progress Notes (Signed)
PULMONARY / CRITICAL CARE MEDICINE CONSULT NOTE  Name: [name] MRN: [id number] DOB: 01/22/1946    ADMISSION DATE:  07/04/2015 CONSULTATION DATE:  07/18/15  REFERRING MD:  Dr. Andrena Mews  CHIEF COMPLAINT:  Hypotension  HISTORY OF PRESENT ILLNESS:   [name] is a 70 y.o. male with alcoholic cirrhosis s/p TIPS and esophageal cancer s/p esophagectomy admitted on 07/04/15 for altered mental status after abruptly stopping alcohol. He was intubated for hypercapneic respiratory failure on 3/21 and admitted to the Medical ICU for treatment of complicated EtOH withdrawal and hepatic encephalopathy. Initial sepsis workup was negative and he was treated with seven days of Ceftriaxone. He was extubated on 3/27 but continued to have problems with confusion and drowsiness.  Earlier the evening on 4/3, he was noted to have a glucose of 60 and given dextrose. His nurse had a difficult time obtaining a temperature and was able to find a temp of 35C after placement of a urethral catheter. He had brisk drainage of 1385mL of urine after Foley placement. He then became hypotensive with BPs reaching 70/50s. The primary team started him on normal saline boluses and empiric vancomycin and zosyn and sent septic work up. He was also noted to have increased hypoxia both for which PCCM was consulted.    VITAL SIGNS: BP 95/59 mmHg  Pulse 73  Temp(Src) 97.7 F (36.5 C) (Oral)  Resp 13  Ht 6\' 2"  (1.88 m)  Wt 214 lb 8 oz (97.297 kg)  BMI 27.53 kg/m2  SpO2 97%  INTAKE / OUTPUT: I/O last 3 completed shifts: In: 4505 [I.V.:655; IV Piggyback:3850] Out: 2000 [Urine:2000]  PHYSICAL EXAMINATION: General:  Fully awake, he follows commands. Is pleasant.  Neuro:  Moves all extremities with good strength; awake, confused. Oriented x2 HEENT:  His mucous membranes are dry, his tongue is thick & heavy. His speech is garbled  Cardiovascular:  RRR, no murmurs rubs or gallops Lungs:  Scattered rhonchi, but no  accessory use  Abdomen:  Soft, distended, nontender to palpation Musculoskeletal:  No abnormalities Skin:  Scattered red macules over trunk (chronic)  LABS:  BMET  Recent Labs Lab 07/17/15 0234 07/17/15 2235 07/18/15 0301  NA 134* 132* 137  K 4.2 4.7 3.1*  CL 100* 98* 106  CO2 24 22 22   BUN 19 22* 19  CREATININE 1.38* 1.33* 1.32*  GLUCOSE 86 120* 78    Electrolytes  Recent Labs Lab 07/17/15 0234 07/17/15 2235 07/18/15 0301  CALCIUM 9.7 9.1 8.1*    CBC  Recent Labs Lab 07/16/15 0308 07/17/15 0234 07/18/15 0301  WBC 5.1 7.3 6.3  HGB 12.3* 11.5* 8.9*  HCT 35.6* 33.3* 26.4*  PLT 144* 132* 108*    Coag's  Recent Labs Lab 07/15/15 0430  INR 1.13    Sepsis Markers  Recent Labs Lab 07/17/15 2235 07/17/15 2355 07/18/15 0301  LATICACIDVEN 2.9* 2.6* 1.6    ABG  Recent Labs Lab 07/15/15 1749 07/17/15 1306 07/18/15 0731  PHART 7.480* 7.388 7.420  PCO2ART 33.8* 43.5 35.8  PO2ART 70.5* 70.0* 417.0*    Liver Enzymes  Recent Labs Lab 07/13/15 0420 07/16/15 0308  AST 48* 80*  ALT 32 50  ALKPHOS 98 147*  BILITOT 0.9 1.7*  ALBUMIN 3.0* 3.5    Cardiac Enzymes No results for input(s): TROPONINI, PROBNP in the last 168 hours.  Glucose  Recent Labs Lab 07/11/15 0900 07/11/15 1201 07/11/15 1705 07/17/15 2028 07/18/15 0002 07/18/15 0258  GLUCAP 117* 125* 111* 67 96 83  Imaging Dg Chest Port 1 View  07/18/2015  CLINICAL DATA:  Desaturation, history of congestive heart failure EXAM: PORTABLE CHEST 1 VIEW COMPARISON:  07/18/2015 at 12:01 a.m. FINDINGS: Cardiac enlargement stable. Mild hazy perihilar opacity bilaterally. Vascular pattern is normal. No definite pleural effusion. IMPRESSION: Mild perihilar haziness. In the absence of vascular congestion or interstitial edema it is felt this is more likely due to atelectasis rather than pulmonary edema but follow-up is recommended. Electronically Signed   By: Skipper Cliche M.D.   On:  07/18/2015 07:15   Dg Chest Port 1 View  07/18/2015  CLINICAL DATA:  Hypothermia EXAM: PORTABLE CHEST 1 VIEW COMPARISON:  07/11/2015 FINDINGS: Cardiac shadow is enlarged but stable. The endotracheal tube is been removed. Lungs are clear bilaterally. No acute bony abnormality is noted. IMPRESSION: No acute abnormality seen. Electronically Signed   By: Inez Catalina M.D.   On: 07/18/2015 00:30    CULTURES: 3/21 blood cultures negative 4/2 BCX2>>>  ANTIBIOTICS: Vanc 4/2>>> Zosyn 4/2>>>   LINES/TUBES: Foley 2 PIVs  DISCUSSION: [name] is a 70 y.o. male with alcoholic cirrhosis s/p TIPS and esophageal cancer s/p esophagectomy admitted on 07/04/15 for altered mental status after abruptly stopping alcohol who was intubated in the setting of hepatic encephalopathy and presumed complicated EtOH withdrawal. He is newly hypoglycemic, hypotensive and hypothermic concerning for developing sepsis. Urinanalysis negative for WBCs but he seems to have been having urinary retention with 1.3L in his bladder. CXR showing RLL airspace disease with HCAP/ aspiration. Control of his oral secretions appears to be his biggest barrier. Concerned about aspiration in setting of poor oral motor control and intermittent encephalopathy. NIPPV is NOT a good treatment in this scenario. Ideally elective trach might offer the best benefit, however in a pt w/ Cirrhosis I have concern about future swallowing, and weather PEG is a reasonable intervention. This is likely to be an issue that is recurrent and eventually he will die from. If he is to be full code we will need to move him to the intensive care, place central line, provide pressors as indicated, cont abx and also go forward w/ elective intubation and trach which I fear may add additional problems.    ASSESSMENT / PLAN:  PULMONARY A: RLL HCAP-->likey aspiration OSA Intermittent hypoxia  ->control of his oral secretions appears to be his biggest barrier.  Concerned about aspiration in setting of poor oral motor control and intermittent encephalopathy. NIPPV is NOT a good treatment in this scenario. Ideally elective trach might offer the best benefit, however in a pt w/ Cirrhosis I have concern about future swallowing, and weather PEG is a reasonable intervention. This is likely to be an issue that is recurrent and eventually he will die from.  P:   Titrate O2 pulmn hygiene  Strict aspiration precautions Elective intubation and trach vs CPAP when sleeping, make DNR and accept aspiration risk). I will meet w/ wife this am about this.   CARDIOVASCULAR A:  Septic shock in setting of HCAP-->lactic acid cleared  R/O adrenal insuff  P:  Hold antihypertensive medications SBP goal >95 Cont IVFs, if family wants aggressive care will move to ICU for CVP and pressors  Ck cortisol   RENAL A:   Mild AKI Urinary retention - unknown cause, may be medicine side effect versus bladder outlet obstruction. Neurologic syndrome also consideration with suspected cervical spinal stenosis on recent MRI P:   Continue foley catheter for now Fluid resuscitation  Avoid hypotension  strick I&O F/u  chem  Pharmacy to dose adjust meds   GI A: ETOH related Cirrhosis. He is s/p TIPS for refractory ascites. He will be at risk for hepatic encephalopathy life-long.  Severe protein calorie malnutrition.  Dysphagia w/ very poor oral motor control as well as witnessed coughing after PO intake  Dysarthria P: Strict aspiration precautions Will speak w/ wife. Not sure PEG is good choice here. Or even if this is a consideration in a cirrhotic.    INFECTIOUS A:   Presumed sepsis prob aspiration 4/3 P:   - Follow up cultures - Fluid resusitation - Vanc and Zosyn; see above   ENDOCRINE A:   Hypoglycemia - likely from sepsis. Consider relative adrenal insufficiency P:   Trend Glucose   Heme A: Anemia of chronic disease Thrombocytopenia in setting of cirrhosis   P: No role for transfusion currently  Trend CBC Cont [facility] heparin   NEUROLOGIC A:   Encephalopathy - believed to be combination of nutritional deficiency and hepatic encephalopathy +/- sepsis.  P:   Continue anti-delirium treatment Supportive care  Cont lactulose and Rifaximin   Erick Colace ACNP-BC [place] Pager # [phone] OR # [phone] if no answer  07/18/2015, 8:54 AM  Attending Note:  70 year old cirrhotic man with esophageal cancer who comes in septic and has been deteriorating overnight.  The patient is not protecting his airway well at this point.  On exam, transmitted upper airway sounds noted.  Our team had a lengthy discussion with the patient's wife as the patient is somewhat confused.  After discussion, decision was made to make patient a full DNR with no intubation and NO CENTRAL LINE OR PRESSORS, no CPR and no cardioversion.  Full DNR.  If patient deteriorates then comfort care.  Recommend involvement of palliative care.  The patient is critically ill with multiple organ systems failure and requires high complexity decision making for assessment and support, frequent evaluation and titration of therapies, application of advanced monitoring technologies and extensive interpretation of multiple databases.   Critical Care Time devoted to patient care services described in this note is  40  Minutes. This time reflects time of care of this signee Dr Jennet Maduro. This critical care time does not reflect procedure time, or teaching time or supervisory time of PA/NP/Med student/Med Resident etc but could involve care discussion time.  Rush Farmer, M.D. [facility] Pulmonary/Critical Care Medicine. Pager: [phone]. After hours pager: [phone].

## 2015-07-18 NOTE — Progress Notes (Signed)
[facility] Progress Note Patient Name: [name] DOB: 28-Aug-1945 MRN: [id number]   Date of Service  07/18/2015  HPI/Events of Note  Called concern hypoxia  eICU Interventions  asseess abg, pcxr stat Add 6 lirters to 1-0-%     Intervention Category Major Interventions: Hypoxemia - evaluation and management  Raylene Miyamoto. 07/18/2015, 6:53 AM

## 2015-07-18 NOTE — Progress Notes (Signed)
Physical Therapy Treatment Patient Details Name: [name] MRN: [id number] DOB: 1945-08-10 Today's Date: 07/18/2015    History of Present Illness [name] is a 70 y.o. male presenting with worsening encephalopathy. 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 He required intubation from 3/21-3/27 thought to be due to severe sleep apnea.     PT Comments    Patient seen for mobility and OOB activity. Patient eager to get OOB. Assisted OOB to chair. Held ambulation this session secondary to soft BPs 76/45, 78/55, 82/54 post activity to chair; + dizziness throughout. Nsg aware. Patient was more oriented this session and able to follow commands consistently but did require increased time for delayed initiation. Will continue to see and progress as tolerated.  OF NOTE: Patient with noted swelling and irritation of his tongue.   Follow Up Recommendations  SNF     Equipment Recommendations  Other (comment) (TBA)    Recommendations for Other Services       Precautions / Restrictions Precautions Precautions: Fall Restrictions Weight Bearing Restrictions: No    Mobility  Bed Mobility Overal bed mobility: Needs Assistance;+2 for physical assistance Bed Mobility: Rolling;Sidelying to Sit Rolling: Mod assist Sidelying to sit: Mod assist;+2 for physical assistance       General bed mobility comments: 2 person moderate assist mainly for initiation and elevation to upright  Transfers Overall transfer level: Needs assistance Equipment used: Rolling walker (2 wheeled) Transfers: Sit to/from [facility] Sit to Stand: Mod assist Stand pivot transfers: Mod assist       General transfer comment: VCs for hand placement in coming to standing with RW. Patient required max multi-modal cues to initiate pivotal steps to chair.  patient reports increased dizziness with upright activity.   Ambulation/Gait             General Gait Details: held ambulation secondary to low BP this session 76/45, 78/55, 82/54 post activity to chair. + dizziness   Stairs            Wheelchair Mobility    Modified Rankin (Stroke Patients Only)       Balance       Sitting balance - Comments: seated EOB with min guard assist Postural control: Posterior lean   Standing balance-Leahy Scale: Poor Standing balance comment: increased assist and heavy reliance on RW                    Cognition Arousal/Alertness: Awake/alert Behavior During Therapy: WFL for tasks assessed/performed Overall Cognitive Status: Impaired/Different from baseline Area of Impairment: Orientation;Attention;Memory;Following commands;Safety/judgement;Awareness;Problem solving Orientation Level: Disoriented to;Time Current Attention Level: Focused Memory: Decreased short-term memory Following Commands: Follows one step commands inconsistently;Follows one step commands with increased time Safety/Judgement: Decreased awareness of safety;Decreased awareness of deficits Awareness:  (pre-intellectual) Problem Solving: Slow processing;Decreased initiation;Difficulty sequencing;Requires verbal cues;Requires tactile cues      Exercises      General Comments        Pertinent Vitals/Pain Pain Assessment: No/denies pain    Home Living                      Prior Function            PT Goals (current goals can now be found in the care plan section) Acute Rehab PT Goals Patient Stated Goal: no stated PT Goal Formulation: With patient Time For Goal  Achievement: 07/28/15 Potential to Achieve Goals: Fair Progress towards PT goals: Progressing toward goals    Frequency  Min 3X/week    PT Plan Current plan remains appropriate    Co-evaluation             End of Session Equipment Utilized During Treatment: Gait  belt Activity Tolerance: Patient tolerated treatment well Patient left: in chair;with call bell/phone within reach;with chair alarm set;with family/visitor present     Time: [id number] PT Time Calculation (min) (ACUTE ONLY): 24 min  Charges:  $Therapeutic Activity: 8-22 mins                    G CodesDuncan Dull 28-Jul-2015, 2:25 PM Alben Deeds, [place] DPT  [phone]

## 2015-07-18 NOTE — Progress Notes (Signed)
Notified Md about pt's sbp  In 70's.  Map is in 60's. Pt's temp improving.  Pt more alert.  Will continue to monitor [name] T

## 2015-07-19 DIAGNOSIS — A419 Sepsis, unspecified organism: Secondary | ICD-10-CM | POA: Diagnosis not present

## 2015-07-19 DIAGNOSIS — J690 Pneumonitis due to inhalation of food and vomit: Secondary | ICD-10-CM

## 2015-07-19 LAB — BASIC METABOLIC PANEL
ANION GAP: 10 (ref 5–15)
BUN: 16 mg/dL (ref 6–20)
CHLORIDE: 105 mmol/L (ref 101–111)
CO2: 20 mmol/L — AB (ref 22–32)
Calcium: 8.2 mg/dL — ABNORMAL LOW (ref 8.9–10.3)
Creatinine, Ser: 1.46 mg/dL — ABNORMAL HIGH (ref 0.61–1.24)
GFR calc non Af Amer: 46 mL/min — ABNORMAL LOW (ref >60)
GFR, EST AFRICAN AMERICAN: 54 mL/min — AB (ref >60)
Glucose, Bld: 88 mg/dL (ref 65–99)
Potassium: 3.6 mmol/L (ref 3.5–5.1)
Sodium: 135 mmol/L (ref 135–145)

## 2015-07-19 LAB — GLUCOSE, CAPILLARY
GLUCOSE-CAPILLARY: 85 mg/dL (ref 65–99)
GLUCOSE-CAPILLARY: 86 mg/dL (ref 65–99)
GLUCOSE-CAPILLARY: 109 mg/dL — AB (ref 65–99)
Glucose-Capillary: 72 mg/dL (ref 65–99)
Glucose-Capillary: 89 mg/dL (ref 65–99)
Glucose-Capillary: 93 mg/dL (ref 65–99)

## 2015-07-19 LAB — CBC
HCT: 28.6 % — ABNORMAL LOW (ref 39.0–52.0)
HEMOGLOBIN: 9.7 g/dL — AB (ref 13.0–17.0)
MCH: 33.8 pg (ref 26.0–34.0)
MCHC: 33.9 g/dL (ref 30.0–36.0)
MCV: 99.7 fL (ref 78.0–100.0)
Platelets: 104 10*3/uL — ABNORMAL LOW (ref 150–400)
RBC: 2.87 MIL/uL — AB (ref 4.22–5.81)
RDW: 15.8 % — ABNORMAL HIGH (ref 11.5–15.5)
WBC: 5.8 10*3/uL (ref 4.0–10.5)

## 2015-07-19 LAB — URINE CULTURE: CULTURE: NO GROWTH

## 2015-07-19 NOTE — Progress Notes (Signed)
Nutrition Follow-up  DOCUMENTATION CODES:   Not applicable  INTERVENTION:   -Continue Magic Cup TID  NUTRITION DIAGNOSIS:   Increased nutrient needs related to chronic illness as evidenced by estimated needs.  Ongoing  GOAL:   Patient will meet greater than or equal to 90% of their needs  Progressing  MONITOR:   PO intake, Supplement acceptance, Labs, Weight trends  REASON FOR ASSESSMENT:   Consult Enteral/tube feeding initiation and management  ASSESSMENT:   70 y/o male with PMH alcoholic liver cirrhosis s/p TIPS procedure 01/2015 for refractory ascites, Esophogeal Ca s/p total esophagectomy (2002), HTN, and Gout.  Presents with worsening encephalopathy/AMS. Wife noted increased hallucinations, altered mental status, and unsteadiness while ambulating over the past few days.  Reports decreased appetite but says he has been drinking a lot of water over the last few days.  Patients last alcoholic drink was 0000000.   SLP following; last BSE on 07/18/15 recommended regular diet with thin liquids. Pt currently receiving a dysphagia 3 diet with thin liquids.   Case discussed with RN. She reports that pt consumed 50% of his breakfast this AM. Pt is doing clinically better, but intake generally varies with degree of encephalopathy. She reports that this morning is the best pt has eaten in the past few days.  Spoke with pt and pt wife at bedside. Speech is a little bit garbled, related to swollen tongue due to pt biting tongue on 07/16/15. Both pt and report report improved mentation and appetite this AM. Pt denies any swallowing difficulties. Discussed importance of good meal intake to promote healing. Pt and pt wife report pt likes sweets. RD will continue with magic cup supplement. Meal completion 30-70%.  Palliative care consult pending. Pt wife reports that she is encouraged that pt looks much better in comparison to yesterday.   Labs reviewed.  Diet Order:  DIET DYS 3 Room service  appropriate?: Yes; Fluid consistency:: Thin  Skin:  Wound (see comment) (MASD groin)  Last BM:  07/18/15  Height:   Ht Readings from Last 1 Encounters:  07/08/15 6\' 2"  (1.88 m)    Weight:   Wt Readings from Last 1 Encounters:  07/19/15 222 lb (100.699 kg)    Ideal Body Weight:  86.4 kg  BMI:  Body mass index is 28.49 kg/(m^2).  Estimated Nutritional Needs:   Kcal:  2200-2400  Protein:  120-135 grams  Fluid:  > 1.5 L/day  EDUCATION NEEDS:   No education needs identified at this time  Nakeem Murnane A. Jimmye Norman, RD, LDN, CDE Pager: [phone] After hours Pager: [phone]

## 2015-07-19 NOTE — Progress Notes (Signed)
Family Medicine Teaching Service Daily Progress Note Intern Pager: [phone]  Patient name: [name] Medical record number: [id number] Date of birth: 1945-04-20 Age: 70 y.o. Gender: male  Primary Care Provider: Chesley Noon, MD Consultants: GI (signed off), CCM Code Status: Full  Pt Overview and Major Events to Date:  3/20 Admitted for AMS after abrupt cessation of ETOH. Treated as hepatic encephalopathy. 3/21 Became somnolent/obtunded. PCCM consulted for hypercapneic resp failure. Started on rocephin.  3/24 Attempted extubation, promptly failed / reintubated; GI signed off  3/26 Weaning on PSV  3/27  Extubated and doing well.  3/28  Rocephin stopped 3/29  Trialled off of BiPap last night; maintained oxygen saturations 3/30  Continue nightly BiPap 4/2  Sepsis. DNR   Assessment and Plan: Bascom Stamler is a 70 y.o. male presenting with worsening encephalopathy. 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 He required intubation from 3/21-3/27 thought to be due to severe sleep apnea.   Sepsis: improved. 1/4 SIRS with hypothermia to 96.3 overnight. BP stable. CXR on 4/3 concerning for aspirational pneumonia/atelectasis. Other possible source SBP (hx of cirrhosis) and UTI (with foley). -Continue Vanc and Zosyn pending blood and urine cultures. Will narrow based on cultures -Follow up blood and urine cultures -Repeat CXR prior to discharge -Bolus NS as needed -Midodrine 5 mg twice a day -D/cd atenolol for low BP's -Monitor respiratory status with continuous pulse oximetry -Continue nightly BiPAP and plan to continue at SNF  -PT consulted and recommended SNF.  Encephalopathy:improved. Patient AAO x4. Likely related to hepatic encephalopathy with TIPs. Also consider Wernicke's encephalopathy.  - Continue thiamine 100 mg daily -  Continue lactulose 30 mg TID - Continue rifaximin 400mg  TID  EtOH cirrhosis: MELD-Na score 14, 6% mortality within 3 months. EtOH < 5, AST 256, ALT 66, Ammonia 96 on admission. S/p TIPS.   - Continue lactulose and rifaximin as above - Hold atenolol for now  AKI: improved 1.32>1.46 (1.96 on admission). Baseline ~ 1.2-1.3.  - Continue to monitor - Avoid nephrotoxic medications  Snoring at rest: likely severe sleep apnea - Consider sleep study outpatient. To page pulmonologist Dr. Corrie Dandy to help arrange sleep study once patient cleared for d/c.   Penile ulcer: Improving. Does not look infected.  No leukocytosis.  - RPR nonreactive, Gc/chlamydia negative  Urinary retention: ?patient with 1.2L UOP with cath. Foley placed.  -voiding trial today with plan to d/c foley  S/p esophagectomy: - Protonix   FEN/GI:  -regular diet -saline lock. Bolus as needed for hypotension  Prophylaxis: SCD given thrmbocytopenia  Disposition: step-down pending clinical improvement. Final dispo SNF  Subjective:  No acute event overnight other than hypothermia to 96.62F. Denies chest pain, shortness of breath & abdominal pain. Has BM yesterday.   Objective: Temp:  [96.3 F (35.7 C)-97.7 F (36.5 C)] 97.3 F (36.3 C) (04/04 0742) Pulse Rate:  [55-80] 55 (04/04 0742) Resp:  [11-17] 13 (04/04 0742) BP: (74-160)/(50-78) 117/73 mmHg (04/04 0742) SpO2:  [94 %-100 %] 98 % (04/04 0750) Weight:  [222 lb (100.699 kg)] 222 lb (100.699 kg) (04/04 0350) Physical Exam: General: lying in bed, sleeping but easily arises, responding to questions appropriately Eyes: PERRL, EOMI, no ptosis Cardiovascular: RRR, S1, S2 Chest: Moving good air throughout all lung fields, fine crackles over right lower lung fields  Abdomen: +BS, soft, NT, resonant to percussion GU: lesion over the dorsum of his penis. No  sign of infection or cellulitis. Foley in place Neuro: AOx4, no gross deficit  Laboratory:  Recent Labs Lab  07/16/15 0308 07/17/15 0234 07/18/15 0301  WBC 5.1 7.3 6.3  HGB 12.3* 11.5* 8.9*  HCT 35.6* 33.3* 26.4*  PLT 144* 132* 108*    Recent Labs Lab 07/13/15 0420  07/16/15 0308  07/17/15 2235 07/18/15 0301 07/19/15 0300  NA 138  < > 131*  < > 132* 137 135  K 4.1  < > 4.4  < > 4.7 3.1* 3.6  CL 107  < > 98*  < > 98* 106 105  CO2 21*  < > 21*  < > 22 22 20*  BUN 27*  < > 19  < > 22* 19 16  CREATININE 1.87*  < > 1.44*  < > 1.33* 1.32* 1.46*  CALCIUM 8.9  < > 9.4  < > 9.1 8.1* 8.2*  PROT 6.2*  --  7.0  --   --   --   --   BILITOT 0.9  --  1.7*  --   --   --   --   ALKPHOS 98  --  147*  --   --   --   --   ALT 32  --  50  --   --   --   --   AST 48*  --  80*  --   --   --   --   GLUCOSE 108*  < > 143*  < > 120* 78 88  < > = values in this interval not displayed.  Imaging/Diagnostic Tests: No results found.  Mercy Riding, MD 07/19/2015, 9:03 AM PGY-1, [place] Intern pager: [phone], text pages welcome

## 2015-07-20 ENCOUNTER — Inpatient Hospital Stay (HOSPITAL_COMMUNITY): Payer: Medicare HMO

## 2015-07-20 LAB — BASIC METABOLIC PANEL
Anion gap: 10 (ref 5–15)
BUN: 12 mg/dL (ref 6–20)
CHLORIDE: 101 mmol/L (ref 101–111)
CO2: 24 mmol/L (ref 22–32)
CREATININE: 1.68 mg/dL — AB (ref 0.61–1.24)
Calcium: 8.5 mg/dL — ABNORMAL LOW (ref 8.9–10.3)
GFR calc Af Amer: 45 mL/min — ABNORMAL LOW (ref >60)
GFR calc non Af Amer: 39 mL/min — ABNORMAL LOW (ref >60)
GLUCOSE: 102 mg/dL — AB (ref 65–99)
Potassium: 4 mmol/L (ref 3.5–5.1)
Sodium: 135 mmol/L (ref 135–145)

## 2015-07-20 LAB — URINALYSIS, ROUTINE W REFLEX MICROSCOPIC
Bilirubin Urine: NEGATIVE
GLUCOSE, UA: NEGATIVE mg/dL
Hgb urine dipstick: NEGATIVE
Ketones, ur: NEGATIVE mg/dL
LEUKOCYTES UA: NEGATIVE
Nitrite: NEGATIVE
PH: 6.5 (ref 5.0–8.0)
PROTEIN: NEGATIVE mg/dL
SPECIFIC GRAVITY, URINE: 1.015 (ref 1.005–1.030)

## 2015-07-20 LAB — GLUCOSE, CAPILLARY
Glucose-Capillary: 109 mg/dL — ABNORMAL HIGH (ref 65–99)
Glucose-Capillary: 72 mg/dL (ref 65–99)
Glucose-Capillary: 84 mg/dL (ref 65–99)
Glucose-Capillary: 87 mg/dL (ref 65–99)
Glucose-Capillary: 92 mg/dL (ref 65–99)
Glucose-Capillary: 94 mg/dL (ref 65–99)

## 2015-07-20 LAB — CBC
HEMATOCRIT: 30.8 % — AB (ref 39.0–52.0)
HEMOGLOBIN: 10.4 g/dL — AB (ref 13.0–17.0)
MCH: 33.8 pg (ref 26.0–34.0)
MCHC: 33.8 g/dL (ref 30.0–36.0)
MCV: 100 fL (ref 78.0–100.0)
Platelets: 107 10*3/uL — ABNORMAL LOW (ref 150–400)
RBC: 3.08 MIL/uL — ABNORMAL LOW (ref 4.22–5.81)
RDW: 15.8 % — AB (ref 11.5–15.5)
WBC: 5.2 10*3/uL (ref 4.0–10.5)

## 2015-07-20 LAB — CREATININE, URINE, RANDOM: Creatinine, Urine: 57.15 mg/dL

## 2015-07-20 LAB — SODIUM, URINE, RANDOM: Sodium, Ur: 109 mmol/L

## 2015-07-20 MED ORDER — LEVOFLOXACIN 750 MG PO TABS
750.0000 mg | ORAL_TABLET | ORAL | Status: AC
  Administered 2015-07-20 – 2015-07-22 (×2): 750 mg via ORAL
  Filled 2015-07-20 (×3): qty 1

## 2015-07-20 MED ORDER — SODIUM CHLORIDE 0.9 % IV BOLUS (SEPSIS)
500.0000 mL | Freq: Once | INTRAVENOUS | Status: AC
  Administered 2015-07-20: 500 mL via INTRAVENOUS

## 2015-07-20 NOTE — Progress Notes (Signed)
transferred to [facility] room 03 by bed, report given to RN, belongings with pt.

## 2015-07-20 NOTE — Progress Notes (Signed)
External cath. came off, not reapplied sec. .to redness on the skin sorounding th penis, managed to use the urinal which is within reach.

## 2015-07-20 NOTE — Progress Notes (Signed)
Physical Therapy Treatment Patient Details Name: [name] MRN: [id number] DOB: 04/06/1946 Today's Date: 07/20/2015    History of Present Illness [name] is a 70 y.o. male presenting with worsening encephalopathy. 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 He required intubation from 3/21-3/27 thought to be due to severe sleep apnea.     PT Comments    Patient seen for mobility progression. Tolerated increased activity today including in room ambulation with assist. Patient appears to show improvements in cognition and carry over this session. At this time, patient remains high fall risk, will need ST SNF upon discharge. Will continue to see and progress as tolerated.  OF NOTE: VSS throughout session on 2 liters O2.  Follow Up Recommendations  SNF     Equipment Recommendations  Other (comment) (TBA)    Recommendations for Other Services       Precautions / Restrictions Precautions Precautions: Fall Restrictions Weight Bearing Restrictions: No    Mobility  Bed Mobility Overal bed mobility: Needs Assistance;+2 for physical assistance Bed Mobility: Rolling;Sidelying to Sit Rolling: Mod assist Sidelying to sit: Mod assist       General bed mobility comments: Moderate assist mainly for initiation and elevation to upright  Transfers Overall transfer level: Needs assistance Equipment used:  (Utilized Bilateral UE support for transfer and gait) Transfers: Sit to/from Stand Sit to Stand: Mod assist         General transfer comment: moderate assist for stability in coming to standing, patient was able to power up with one person assist. Cues for hand placement and increased effort.  Ambulation/Gait Ambulation/Gait assistance: Min assist Ambulation Distance (Feet): 20 Feet Assistive device: Rolling walker (2 wheeled) Gait  Pattern/deviations: Step-through pattern;Decreased stride length;Shuffle;Trunk flexed     General Gait Details: tolerated in room ambulation well this session, some noted LE fatigue. Assist for stability and heavy reliance on UE support   Stairs            Wheelchair Mobility    Modified Rankin (Stroke Patients Only)       Balance     Sitting balance-Leahy Scale: Fair Sitting balance - Comments: able to sit EOB without assist today for extended period of time     Standing balance-Leahy Scale: Poor Standing balance comment: heavy reliance on UE support                    Cognition Arousal/Alertness: Awake/alert Behavior During Therapy: WFL for tasks assessed/performed Overall Cognitive Status: Impaired/Different from baseline Area of Impairment: Safety/judgement;Awareness;Problem solving         Safety/Judgement: Decreased awareness of safety;Decreased awareness of deficits Awareness: Intellectual Problem Solving: Slow processing;Decreased initiation;Difficulty sequencing;Requires verbal cues;Requires tactile cues General Comments: improvements noted in memory and consistency this session    Exercises      General Comments General comments (skin integrity, edema, etc.): Tolerated general LEs ther-ex in bed prior to activity (AROM ankle, knee flexion)      Pertinent Vitals/Pain Pain Assessment: No/denies pain    Home Living                      Prior Function            PT Goals (current goals can now be found in the care plan section) Acute Rehab PT Goals Patient Stated Goal: none stated PT Goal Formulation: With patient Time For Goal  Achievement: 07/28/15 Potential to Achieve Goals: Fair Progress towards PT goals: Progressing toward goals    Frequency  Min 3X/week    PT Plan Current plan remains appropriate    Co-evaluation             End of Session Equipment Utilized During Treatment: Gait belt Activity Tolerance:  Patient tolerated treatment well Patient left: in chair;with call bell/phone within reach;with chair alarm set;with family/visitor present     Time: [id number] PT Time Calculation (min) (ACUTE ONLY): 20 min  Charges:  $Therapeutic Activity: 8-22 mins                    G CodesDuncan Dull 2015/07/25, 11:59 AM Alben Deeds, PT DPT  [phone]

## 2015-07-20 NOTE — Clinical Social Work Note (Signed)
Clinical Social Work Assessment  Patient Details  Name: Korry Kitts MRN: [id number] Date of Birth: Jun 20, 1945  Date of referral:  07/20/15               Reason for consult:  Facility Placement                Permission sought to share information with:  Facility Sport and exercise psychologist, Family Supports Permission granted to share information::  Yes, Verbal Permission Granted  Name::     [place] or [phone]  Agency::  SNF admissions  Relationship::     Contact Information:     Housing/Transportation Living arrangements for the past 2 months:  Single Family Home Source of Information:  Spouse Patient Interpreter Needed:  None Criminal Activity/Legal Involvement Pertinent to Current Situation/Hospitalization:  No - Comment as needed Significant Relationships:  Spouse Lives with:  Spouse Do you feel safe going back to the place where you live?  No (Patient needs some short term rehab before she can return back home.) Need for family participation in patient care:  Yes (Comment)  Care giving concerns:  Patient lives with his wife who feel he needs some short term rehab before he can return back home.   Social Worker assessment / plan: Patient is a 70 year old male who lives with his wife.  Patient is alert and oriented x3, patient's wife was at bedside.  Patient was on his way to a procedure, CSW spoke to patient's wife to complete assessment.  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  Patient's wife expressed concern that she can not take care of him by herself right now until he has received some short term rehab.  Patient's wife reports that he has never been to rehab before, CSW explained to patient's wife what to expect at SNF placement and day of discharge.  Patient's wife was informed about the role of CSW and explained how insurance  will pay for patient's stay at [facility].  Patient's wife did not express any other questions or concerns about having patient go to SNF for rehab.  Employment status:  [employer] information:  Managed Medicare PT Recommendations:  [place] / Referral to community resources:     Patient/Family's Response to care:  Patient and wife in agreement to going to SNF for short term rehab.  Patient/Family's Understanding of and Emotional Response to Diagnosis, Current Treatment, and Prognosis:  Patient and wife aware of current treatment plan and diagnosis.  Emotional Assessment Appearance:  Appears stated age Attitude/Demeanor/Rapport:    Affect (typically observed):  Appropriate, Calm, Stable Orientation:  Oriented to Self, Oriented to Place, Oriented to  Time Alcohol / Substance use:  Alcohol Use Psych involvement (Current and /or in the community):  No (Comment)  Discharge Needs  Concerns to be addressed:  Lack of Support Readmission within the last 30 days:  No Current discharge risk:  Lack of support system Barriers to Discharge:  Insurance Authorization   Anell Barr 07/20/2015, 1:05 PM

## 2015-07-20 NOTE — Progress Notes (Signed)
Family Medicine Teaching Service Daily Progress Note Intern Pager: [phone]  Patient name: [name] Medical record number: [id number] Date of birth: 04/01/1946 Age: 70 y.o. Gender: male  Primary Care Provider: Chesley Noon, MD Consultants: GI (signed off), CCM Code Status: Full  Pt Overview and Major Events to Date:  3/20 Admitted for AMS after abrupt cessation of ETOH. Treated as hepatic encephalopathy. 3/21 Became somnolent/obtunded. PCCM consulted for hypercapneic resp failure. Started on rocephin.  3/24 Attempted extubation, promptly failed / reintubated; GI signed off  3/26 Weaning on PSV  3/27  Extubated and doing well.  3/28  Rocephin stopped 3/29  Trialled off of BiPap last night; maintained oxygen saturations 3/30  Continue nightly BiPap 4/2  Sepsis. DNR   Assessment and Plan: Arch Plant is a 70 y.o. male presenting with worsening encephalopathy. 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 He required intubation from 3/21-3/27 thought to be due to severe sleep apnea.   Sepsis: resolved. Blood and urine cultures NGTD. Repeat CXR with some improvement of infiltrate/atelectasis -Vanc and Zosyn (4/2-4/4) -Levaquin 750 mg 7/5 and 7/7 -Follow up blood and urine cultures -Bolus NS as needed -Midodrine 5 mg twice a day -D/cd atenolol for low BP's -Monitor respiratory status with continuous pulse oximetry -Continue nightly BiPAP and plan to continue at SNF  -PT consulted and recommended SNF.  Encephalopathy: improved. Patient AAO x4. Likely related to hepatic encephalopathy with TIPs. - Continue thiamine 100 mg daily - Continue lactulose 30 mg TID - Continue rifaximin 400mg  TID  EtOH cirrhosis: MELD-Na score 14, 6% mortality within 3 months. EtOH < 5, AST 256, ALT 66, Ammonia 96 on admission. S/p TIPS in 2016.   -  Continue lactulose and rifaximin as above - Hold atenolol for now  AKI: Cr uptrending again 1.32>1.46>1.64 (1.96 on admission). Baseline ~ 1.2-1.3.  -Bolus 500 ml after FENA and UA -bladder scan for postvoid residual today -FENA -UA with microscopy -Avoid nephrotoxic medications  Urinary retention: voiding with condom cath - d/cd foley  Penile ulcer: Improving. Does not look infected.  No leukocytosis.  - RPR nonreactive, Gc/chlamydia negative  Snoring at rest: likely severe sleep apnea - Consider sleep study outpatient. To page pulmonologist Dr. Corrie Dandy to help arrange sleep study once patient cleared for d/c.   S/p esophagectomy: - Protonix   FEN/GI:  -regular diet -saline lock. Bolus as needed for hypotension  Prophylaxis: SCD given thrmbocytopenia  Disposition: transfer to floor. Final dispo SNF.  Subjective:  No acute event overnight. Vital signs stable overnight except for diastolic BP to 55 once. Denies chest pain, shortness of breath & abdominal pain. Eating and drinking well.   Objective: Temp:  [97.5 F (36.4 C)-98.4 F (36.9 C)] 97.6 F (36.4 C) (04/05 1110) Pulse Rate:  [58-72] 72 (04/05 1110) Resp:  [9-18] 12 (04/05 1110) BP: (95-143)/(55-78) 97/60 mmHg (04/05 1110) SpO2:  [92 %-100 %] 96 % (04/05 1110) Weight:  [224 lb 3.2 oz (101.696 kg)] 224 lb 3.2 oz (101.696 kg) (04/05 0354) Physical Exam: General: lying in bed, awake, responding to questions appropriately Eyes: PERRL, EOMI, no ptosis Cardiovascular: RRR, S1, S2 Chest: Moving good air throughout all lung fields, no crackles today Abdomen: +BS, soft, NT, resonant to percussion GU: lesion over the dorsum of his penis. No sign of infection or cellulitis. Condom catheter in place Neuro: AOx4, no gross deficit  Laboratory:  Recent Labs Lab  07/18/15 0301 07/19/15 0956 07/20/15 0915  WBC 6.3 5.8 5.2  HGB 8.9* 9.7* 10.4*  HCT 26.4* 28.6* 30.8*  PLT 108* 104* 107*    Recent Labs Lab  07/16/15 0308  07/18/15 0301 07/19/15 0300 07/20/15 0915  NA 131*  < > 137 135 135  K 4.4  < > 3.1* 3.6 4.0  CL 98*  < > 106 105 101  CO2 21*  < > 22 20* 24  BUN 19  < > 19 16 12   CREATININE 1.44*  < > 1.32* 1.46* 1.68*  CALCIUM 9.4  < > 8.1* 8.2* 8.5*  PROT 7.0  --   --   --   --   BILITOT 1.7*  --   --   --   --   ALKPHOS 147*  --   --   --   --   ALT 50  --   --   --   --   AST 80*  --   --   --   --   GLUCOSE 143*  < > 78 88 102*  < > = values in this interval not displayed.  Imaging/Diagnostic Tests: Dg Chest 2 View  07/20/2015  CLINICAL DATA:  Shortness of breath, weakness, pneumonia EXAM: CHEST  2 VIEW COMPARISON:  07/18/2015 FINDINGS: Cardiomegaly again noted. Limited study by poor inspiration. Central mild vascular congestion and mild infrahilar interstitial prominence suspicious for minimal interstitial edema. Hazy left basilar atelectasis or infiltrate. IMPRESSION: Central mild vascular congestion and mild infrahilar interstitial prominence suspicious for minimal interstitial edema. Hazy left basilar atelectasis or infiltrate. Electronically Signed   By: Lahoma Crocker M.D.   On: 07/20/2015 11:48    Mercy Riding, MD 07/20/2015, 1:20 PM PGY-1, [place] Intern pager: [phone], text pages welcome

## 2015-07-20 NOTE — Care Management Note (Signed)
Case Management Note  Patient Details  Name: [name] MRN: [id number] Date of Birth: 1945-07-08  Subjective/Objective:   Patient presented with worseing encephalopathy , has hx of alcoholic cirrhosis.  Iv abx changed to po today, will monitor for 24 hrs and plan for dc to snf 4/6.  CSW aware.                  Action/Plan:   Expected Discharge Date:                  Expected Discharge Plan:  Skilled Nursing Facility  In-House Referral:  Clinical Social Work  Discharge planning Services  CM Consult  Post Acute Care Choice:    Choice offered to:     DME Arranged:    DME Agency:     HH Arranged:    [place] Agency:     Status of Service:  Completed, signed off  Medicare Important Message Given:  Yes Date Medicare IM Given:    Medicare IM give by:    Date Additional Medicare IM Given:    Additional Medicare Important Message give by:     If discussed at [place] of Stay Meetings, dates discussed:    Additional Comments:  Zenon Mayo, RN 07/20/2015, 12:54 PM

## 2015-07-20 NOTE — Clinical Social Work Placement (Signed)
   CLINICAL SOCIAL WORK PLACEMENT  NOTE  Date:  07/20/2015  Patient Details  Name: [name] MRN: [id number] Date of Birth: 02-Jan-1946  Clinical Social Work is seeking post-discharge placement for this patient at the [place] level of care (*CSW will initial, date and re-position this form in  chart as items are completed):  Yes   Patient/family provided with [place] Work Department's list of facilities offering this level of care within the geographic area requested by the patient (or if unable, by the patient's family).  Yes   Patient/family informed of their freedom to choose among providers that offer the needed level of care, that participate in Medicare, Medicaid or managed care program needed by the patient, have an available bed and are willing to accept the patient.  Yes   Patient/family informed of [place]'s ownership interest in [facility] and [facility], as well as of the fact that they are under no obligation to receive care at these facilities.  PASRR submitted to EDS on 07/20/15     PASRR number received on 07/20/15     Existing PASRR number confirmed on       FL2 transmitted to all facilities in geographic area requested by pt/family on 07/20/15     FL2 transmitted to all facilities within larger geographic area on       Patient informed that his/her managed care company has contracts with or will negotiate with certain facilities, including the following:            Patient/family informed of bed offers received.  Patient chooses bed at       Physician recommends and patient chooses bed at      Patient to be transferred to   on  .  Patient to be transferred to facility by       Patient family notified on   of transfer.  Name of family member notified:        PHYSICIAN Please sign FL2, Please sign DNR     Additional Comment:    _______________________________________________ Ross Ludwig,  LCSWA 07/20/2015, 1:14 PM

## 2015-07-20 NOTE — NC FL2 (Signed)
[place] MEDICAID FL2 LEVEL OF CARE SCREENING TOOL     IDENTIFICATION  Patient Name: [name] Birthdate: 05/04/1945 Sex: male Admission Date (Current Location): 07/04/2015  [facility] and [place] Number:  Herbalist and Address:  [facility], [place] [address] [place], [place] [address]      Provider Number: [id number]  Attending Physician Name and Address:  Blane Ohara McDiarmid, MD  Relative Name and Phone Number:  Jumar, [place] [phone] or [phone]    Current Level of Care: Hospital Recommended Level of Care: [place] Prior Approval Number:    Date Approved/Denied:   PASRR Number: [id number]  Discharge Plan: SNF    Current Diagnoses: Patient Active Problem List   Diagnosis Date Noted  . Aspiration pneumonia of right middle lobe ([place])   . Sepsis ([place])   . Encephalopathy acute   . HCAP (healthcare-associated pneumonia)   . Encephalopathy   . Alcohol withdrawal ([place]) 07/12/2015  . Respiratory failure with hypoxia and hypercapnia ([place]) 07/05/2015  . Abdominal distention   . Alcohol abuse   . Cirrhosis ([place])   . Penile ulcer   . Acute respiratory failure with hypoxia and hypercapnia ([facility])   . Hepatic encephalopathy ([place]) 07/04/2015  . Acid indigestion 01/05/2015  . Cancer of esophagus ([place]) 01/05/2015  . Gout 01/05/2015  . S/P TIPS (transjugular intrahepatic portosystemic shunt)   . Ascites-treated 06/14/2014  . Abnormal magnetic resonance imaging study 05/16/2012  . Chronic cervical pain 05/16/2012  . Acute infection of nasal sinus 05/15/2012    Orientation RESPIRATION BLADDER Height & Weight     Self, Time, Place  O2 (2 L) Indwelling catheter Weight: 224 lb 3.2 oz (101.696 kg) Height:  6\' 2"  (188 cm)  BEHAVIORAL SYMPTOMS/MOOD NEUROLOGICAL BOWEL NUTRITION STATUS      Incontinent Diet (Dysphagia 3 diet)  AMBULATORY STATUS COMMUNICATION OF NEEDS Skin   Limited Assist Verbally Surgical  wounds                       Personal Care Assistance Level of Assistance  Bathing, Dressing Bathing Assistance: Limited assistance   Dressing Assistance: Limited assistance     Functional Limitations Info             SPECIAL CARE FACTORS FREQUENCY  PT (By licensed PT)     PT Frequency: 5x a week              Contractures      Additional Factors Info  Allergies, Code Status Code Status Info: DNR Allergies Info: Amoxicillin           Current Medications (07/20/2015):  This is the current hospital active medication list Current Facility-Administered Medications  Medication Dose Route Frequency Provider Last Rate Last Dose  . 0.9 %  sodium chloride infusion   Intravenous Continuous Donita Brooks, NP   Stopped at 07/09/15 1800  . 0.9 %  sodium chloride infusion  250 mL Intravenous PRN Rush Landmark, MD   Stopped at 07/08/15 2000  . allopurinol (ZYLOPRIM) tablet 300 mg  300 mg Oral Daily Lupita Dawn, MD   300 mg at 07/20/15 0934  . antiseptic oral rinse (CPC / CETYLPYRIDINIUM CHLORIDE 0.05%) solution 7 mL  7 mL Mouth Rinse q12n4p Rush Farmer, MD   7 mL at 07/19/15 1600  . budesonide (PULMICORT) nebulizer solution 0.5 mg  0.5 mg Nebulization BID Jose Shirl Harris, MD   0.5  mg at 07/20/15 0818  . chlorhexidine (PERIDEX) 0.12 % solution 15 mL  15 mL Mouth Rinse BID Rush Farmer, MD   15 mL at 07/20/15 0935  . dextrose 5 %-0.9 % sodium chloride infusion   Intravenous Continuous Vivi Barrack, MD 10 mL/hr at 07/20/15 0000    . folic acid (FOLVITE) tablet 1 mg  1 mg Oral Daily Kinnie Feil, MD   1 mg at 07/20/15 0934  . haloperidol lactate (HALDOL) injection 1 mg  1 mg Intravenous Q6H PRN Vivi Barrack, MD   1 mg at 07/17/15 1531  . ipratropium-albuterol (DUONEB) 0.5-2.5 (3) MG/3ML nebulizer solution 3 mL  3 mL Nebulization BID Jose Shirl Harris, MD   3 mL at 07/20/15 0818  . lactulose (CHRONULAC) 10 GM/15ML solution 30 g  30 g Oral TID Vivi Barrack, MD   30 g at 07/20/15 0935   Or  . lactulose (CHRONULAC) enema 200 gm  300 mL Rectal TID Vivi Barrack, MD   300 mL at 07/17/15 2310  . levofloxacin (LEVAQUIN) tablet 750 mg  750 mg Oral Q48H [place] Riding, MD      . midodrine (PROAMATINE) tablet 5 mg  5 mg Oral TID WC [place] Riding, MD   5 mg at 07/20/15 0934  . multivitamin with minerals tablet 1 tablet  1 tablet Oral Daily Rogue Bussing, MD   1 tablet at 07/20/15 [phone]  . pantoprazole (PROTONIX) EC tablet 40 mg  40 mg Oral Q1200 Kinnie Feil, MD   40 mg at 07/19/15 0831  . rifaximin (XIFAXAN) tablet 400 mg  400 mg Oral TID Virginia Crews, MD   400 mg at 07/20/15 0934  . thiamine (VITAMIN B-1) tablet 100 mg  100 mg Oral Daily Rogue Bussing, MD   100 mg at 07/20/15 [id number]     Discharge Medications: Please see discharge summary for a list of discharge medications.  Relevant Imaging Results:  Relevant Lab Results:   Additional Information SSN [id number]  Ross Ludwig, [place]

## 2015-07-20 NOTE — Clinical Social Work Note (Signed)
CSW met with patient's wife to discuss SNF bed search process.  Patient's wife agreed to have CSW look for SNFs for patient in [place].  Jones Broom. [place], MSW, [place] 07/20/2015 11:49 AM

## 2015-07-21 LAB — BASIC METABOLIC PANEL
ANION GAP: 10 (ref 5–15)
BUN: 11 mg/dL (ref 6–20)
CALCIUM: 8.5 mg/dL — AB (ref 8.9–10.3)
CO2: 25 mmol/L (ref 22–32)
CREATININE: 1.42 mg/dL — AB (ref 0.61–1.24)
Chloride: 100 mmol/L — ABNORMAL LOW (ref 101–111)
GFR calc Af Amer: 55 mL/min — ABNORMAL LOW (ref >60)
GFR, EST NON AFRICAN AMERICAN: 48 mL/min — AB (ref >60)
GLUCOSE: 82 mg/dL (ref 65–99)
Potassium: 4 mmol/L (ref 3.5–5.1)
Sodium: 135 mmol/L (ref 135–145)

## 2015-07-21 LAB — CBC
HEMATOCRIT: 27.3 % — AB (ref 39.0–52.0)
Hemoglobin: 9.4 g/dL — ABNORMAL LOW (ref 13.0–17.0)
MCH: 34.2 pg — AB (ref 26.0–34.0)
MCHC: 34.4 g/dL (ref 30.0–36.0)
MCV: 99.3 fL (ref 78.0–100.0)
PLATELETS: 111 10*3/uL — AB (ref 150–400)
RBC: 2.75 MIL/uL — ABNORMAL LOW (ref 4.22–5.81)
RDW: 15.9 % — AB (ref 11.5–15.5)
WBC: 4.1 10*3/uL (ref 4.0–10.5)

## 2015-07-21 LAB — GLUCOSE, CAPILLARY
Glucose-Capillary: 75 mg/dL (ref 65–99)
Glucose-Capillary: 104 mg/dL — ABNORMAL HIGH (ref 65–99)

## 2015-07-21 NOTE — Progress Notes (Addendum)
5:10pm CSW provided offers to pt and pt wife- they choose [facility] will need specific CPAP settings for pt to order equipment.  2:20pm No bed offers at this time- CSW expanded bed search-offers pending  Domenica Reamer, [place] Social Worker [phone]

## 2015-07-21 NOTE — Progress Notes (Signed)
RT NOTE:  Pt refuses CPAP tonight. 

## 2015-07-21 NOTE — Care Management Important Message (Signed)
Important Message  Patient Details  Name: Derold Ramirezgarcia MRN: [id number] Date of Birth: 01/21/1946   Medicare Important Message Given:  Yes    Barb Merino Lamar 07/21/2015, 12:41 PM

## 2015-07-21 NOTE — Progress Notes (Addendum)
Physical Therapy Treatment Patient Details Name: Quavon Wacek MRN: [id number] DOB: 10-23-1945 Today's Date: 07/21/2015    History of Present Illness Giezi Burbach is a 70 y.o. male presenting with worsening encephalopathy. 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 He required intubation from 3/21-3/27 thought to be due to severe sleep apnea.     PT Comments    Patient seen for ambulation and activity progression. Patient continues to require physical assist for mobility but was able to tolerate increased activity this session. Ambulated in hall with RW and physical assist. Will continue to see and progress as tolerated.   Follow Up Recommendations  SNF     Equipment Recommendations  Other (comment) (TBA)    Recommendations for Other Services       Precautions / Restrictions Precautions Precautions: Fall Restrictions Weight Bearing Restrictions: No    Mobility  Bed Mobility Overal bed mobility: Needs Assistance;+2 for physical assistance Bed Mobility: Rolling;Sidelying to Sit Rolling: Mod assist Sidelying to sit: Mod assist  Sit to Supine: Mod assist     General bed mobility comments: Moderate assist mainly for initiation and elevation to upright, moderate assist to elevate LEs back to supine and reposition in bed  Transfers Overall transfer level: Needs assistance Equipment used:  (Utilized Bilateral UE support for transfer and gait) Transfers: Sit to/from Stand Sit to Stand: Mod assist         General transfer comment: Vcs for hand placement and safety when elevating to standing. Patient cued for positioning at EOB and rocker technique  Ambulation/Gait Ambulation/Gait assistance: Min assist;Mod assist Ambulation Distance (Feet): 140 Feet Assistive device: Rolling walker (2 wheeled) Gait Pattern/deviations:  Step-through pattern;Decreased stride length;Shuffle;Trunk flexed     General Gait Details: increased distance today on room air, noted LE fatigue and multiple LOB, manual assist to help pace and provide stability, increased assist with fatigue (moderate)   Stairs            Wheelchair Mobility    Modified Rankin (Stroke Patients Only)       Balance     Sitting balance-Leahy Scale: Fair Sitting balance - Comments: able to sit EOB without assist today for extended period of time   Standing balance support: Bilateral upper extremity supported Standing balance-Leahy Scale: Poor Standing balance comment: heavy reliance on UE support                    Cognition Arousal/Alertness: Awake/alert Behavior During Therapy: WFL for tasks assessed/performed Overall Cognitive Status: Impaired/Different from baseline Area of Impairment: Safety/judgement;Awareness;Problem solving         Safety/Judgement: Decreased awareness of safety;Decreased awareness of deficits Awareness: Intellectual Problem Solving: Slow processing;Decreased initiation;Difficulty sequencing;Requires verbal cues;Requires tactile cues General Comments: improvements noted in memory and consistency this session    Exercises      General Comments        Pertinent Vitals/Pain      Home Living                      Prior Function            PT Goals (current goals can now be found in the care plan section) Acute Rehab PT Goals Patient Stated Goal: none stated PT Goal Formulation: With patient Time For Goal Achievement: 07/28/15 Potential to Achieve Goals: Fair Progress towards PT goals: Progressing toward goals  Frequency  Min 3X/week    PT Plan Current plan remains appropriate    Co-evaluation             End of Session Equipment Utilized During Treatment: Gait belt Activity Tolerance: Patient tolerated treatment well Patient left: in bed, with call bell/phone  within reach;with bed alarm set;with family/visitor present     Time: [id number] PT Time Calculation (min) (ACUTE ONLY): 19 min  Charges:  $Gait Training: 8-22 mins                    G CodesDuncan Dull 08-07-15, 5:45 PM Alben Deeds, [place] DPT  [phone]

## 2015-07-21 NOTE — Progress Notes (Signed)
Family Medicine Teaching Service Daily Progress Note Intern Pager: [phone]  Patient name: [name] Medical record number: [id number] Date of birth: 10/18/1945 Age: 70 y.o. Gender: male  Primary Care Provider: Chesley Noon, MD Consultants: GI (signed off), CCM Code Status: Full  Pt Overview and Major Events to Date:  3/20 Admitted for AMS after abrupt cessation of ETOH. Treated as hepatic encephalopathy. 3/21 Became somnolent/obtunded. PCCM consulted for hypercapneic resp failure. Started on rocephin.  3/24 Attempted extubation, promptly failed / reintubated; GI signed off  3/26 Weaning on PSV  3/27  Extubated and doing well.  3/28  Rocephin stopped 3/29  Trialled off of BiPap last night; maintained oxygen saturations 3/30  Continue nightly BiPap 4/2  Sepsis. DNR   Assessment and Plan: [name] is a 70 y.o. male presenting with worsening encephalopathy. 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 He required intubation from 3/21-3/27 thought to be due to severe sleep apnea.   Sepsis: resolved. Blood and urine cultures NGTD. Repeat CXR with some improvement of infiltrate/atelectasis -Vanc and Zosyn (4/2-4/4) -Levaquin 750 mg 7/5 and 7/7 -Bolus NS as needed -Midodrine 5 mg twice a day -D/cd atenolol for low BP's -Monitor respiratory status with continuous pulse oximetry -Continue nightly BiPAP and plan to continue at SNF  -PT consulted and recommended SNF.  Encephalopathy: improved. Patient AAO x4. Likely related to hepatic encephalopathy with TIPs. - Continue thiamine 100 mg daily - Continue lactulose 30 mg TID - Continue rifaximin 400mg  TID  EtOH cirrhosis: MELD-Na score 14, 6% mortality within 3 months. EtOH < 5, AST 256, ALT 66, Ammonia 96 on admission. S/p TIPS in 2016.   - Continue lactulose and rifaximin as  above - Hold atenolol for now  AKI: improved. 1.32>1.46>1.64>1.42 (1.96 on admission). Baseline ~ 1.2-1.3. FENA suggestive for ATN.  -Avoid nephrotoxic medications  Urinary retention: resolved. voiding with condom cath. Bladder scan 26 mls on 04/05 - d/cd cath foley on 04/04  Penile ulcer: Improving. Does not look infected.  No leukocytosis.  - RPR nonreactive, Gc/chlamydia negative  Snoring at rest: likely severe sleep apnea - Consider sleep study outpatient. To page pulmonologist Dr. Corrie Dandy to help arrange sleep study once patient cleared for d/c.   S/p esophagectomy: - Protonix   FEN/GI:  -regular diet -saline lock. Bolus as needed for hypotension  Prophylaxis: SCD given thrmbocytopenia  Disposition: SNF  Subjective:  No acute event overnight. Denies chest pain, shortness of breath & abdominal pain. Eating and drinking well.   Objective: Temp:  [97.5 F (36.4 C)-98.2 F (36.8 C)] 97.9 F (36.6 C) (04/06 0430) Pulse Rate:  [64-78] 68 (04/06 0430) Resp:  [11-20] 17 (04/06 0430) BP: (97-127)/(58-83) 114/62 mmHg (04/06 0430) SpO2:  [96 %-100 %] 100 % (04/06 0430) Weight:  [219 lb 12.8 oz (99.701 kg)-221 lb 4.8 oz (100.381 kg)] 219 lb 12.8 oz (99.701 kg) (04/06 0430)   Physical Exam: General: lying in bed, awake, responding to questions appropriately Eyes: PERRL, EOMI, no ptosis Cardiovascular: RRR, S1, S2 Chest: Moving good air throughout all lung fields, no crackles today Abdomen: +BS, soft, NT, resonant to percussion GU: lesion over the dorsum of his penis. No sign of infection or cellulitis. Condom catheter in place Neuro: AOx4, no gross deficit  Laboratory:  Recent Labs Lab 07/19/15 0956 07/20/15 0915 07/21/15 0446  WBC 5.8 5.2 4.1  HGB 9.7* 10.4* 9.4*  HCT 28.6* 30.8*  27.3*  PLT 104* 107* 111*    Recent Labs Lab 07/16/15 0308  07/19/15 0300 07/20/15 0915 07/21/15 0446  NA 131*  < > 135 135 135  K 4.4  < > 3.6 4.0 4.0  CL 98*  < > 105 101  100*  CO2 21*  < > 20* 24 25  BUN 19  < > 16 12 11   CREATININE 1.44*  < > 1.46* 1.68* 1.42*  CALCIUM 9.4  < > 8.2* 8.5* 8.5*  PROT 7.0  --   --   --   --   BILITOT 1.7*  --   --   --   --   ALKPHOS 147*  --   --   --   --   ALT 50  --   --   --   --   AST 80*  --   --   --   --   GLUCOSE 143*  < > 88 102* 82  < > = values in this interval not displayed.  Imaging/Diagnostic Tests: Dg Chest 2 View  07/20/2015  CLINICAL DATA:  Shortness of breath, weakness, pneumonia EXAM: CHEST  2 VIEW COMPARISON:  07/18/2015 FINDINGS: Cardiomegaly again noted. Limited study by poor inspiration. Central mild vascular congestion and mild infrahilar interstitial prominence suspicious for minimal interstitial edema. Hazy left basilar atelectasis or infiltrate. IMPRESSION: Central mild vascular congestion and mild infrahilar interstitial prominence suspicious for minimal interstitial edema. Hazy left basilar atelectasis or infiltrate. Electronically Signed   By: Lahoma Crocker M.D.   On: 07/20/2015 11:48    Mercy Riding, MD 07/21/2015, 7:12 AM PGY-1, [place] Intern pager: [phone], text pages welcome

## 2015-07-22 ENCOUNTER — Telehealth: Payer: Self-pay | Admitting: Pulmonary Disease

## 2015-07-22 MED ORDER — LACTULOSE ENEMA
300.0000 mL | Freq: Four times a day (QID) | ORAL | Status: DC
  Filled 2015-07-22 (×2): qty 300

## 2015-07-22 MED ORDER — IPRATROPIUM-ALBUTEROL 0.5-2.5 (3) MG/3ML IN SOLN: 3.0000 mL | Freq: Two times a day (BID) | RESPIRATORY_TRACT | Status: AC

## 2015-07-22 MED ORDER — FOLIC ACID 1 MG PO TABS: 1.0000 mg | ORAL_TABLET | Freq: Every day | ORAL | Status: AC

## 2015-07-22 MED ORDER — THIAMINE HCL 100 MG PO TABS: 100.0000 mg | ORAL_TABLET | Freq: Every day | ORAL | Status: AC

## 2015-07-22 MED ORDER — LACTULOSE 10 GM/15ML PO SOLN: 30.0000 g | Freq: Four times a day (QID) | ORAL | Status: DC

## 2015-07-22 MED ORDER — BUDESONIDE 0.5 MG/2ML IN SUSP: 0.5000 mg | Freq: Two times a day (BID) | RESPIRATORY_TRACT | Status: DC

## 2015-07-22 MED ORDER — RIFAXIMIN 200 MG PO TABS: 400.0000 mg | ORAL_TABLET | Freq: Three times a day (TID) | ORAL | Status: DC

## 2015-07-22 MED ORDER — MIDODRINE HCL 5 MG PO TABS: 5.0000 mg | ORAL_TABLET | Freq: Three times a day (TID) | ORAL | Status: AC

## 2015-07-22 MED ORDER — ADULT MULTIVITAMIN W/MINERALS CH: 1.0000 | ORAL_TABLET | Freq: Every day | ORAL | Status: DC

## 2015-07-22 NOTE — Clinical Social Work Placement (Signed)
   CLINICAL SOCIAL WORK PLACEMENT  NOTE  Date:  07/22/2015  Patient Details  Name: Su Amano MRN: [id number] Date of Birth: 1945-06-26  Clinical Social Work is seeking post-discharge placement for this patient at the [place] level of care (*CSW will initial, date and re-position this form in  chart as items are completed):  Yes   Patient/family provided with [place] Work Department's list of facilities offering this level of care within the geographic area requested by the patient (or if unable, by the patient's family).  Yes   Patient/family informed of their freedom to choose among providers that offer the needed level of care, that participate in Medicare, Medicaid or managed care program needed by the patient, have an available bed and are willing to accept the patient.  Yes   Patient/family informed of [facility]'s ownership interest in [facility] and [facility], as well as of the fact that they are under no obligation to receive care at these facilities.  PASRR submitted to EDS on 07/20/15     PASRR number received on 07/20/15     Existing PASRR number confirmed on       FL2 transmitted to all facilities in geographic area requested by pt/family on 07/20/15     FL2 transmitted to all facilities within larger geographic area on       Patient informed that his/her managed care company has contracts with or will negotiate with certain facilities, including the following:        Yes   Patient/family informed of bed offers received.  Patient chooses bed at [facility]     Physician recommends and patient chooses bed at      Patient to be transferred to [facility] on 07/22/15.  Patient to be transferred to facility by Ambulance     Patient family notified on 07/22/15 of transfer.  Name of family member notified:  Patient wife notified over the phone     PHYSICIAN Please sign FL2, Please sign DNR      Additional Comment:    Barbette Or, [place]

## 2015-07-22 NOTE — Clinical Social Work Note (Signed)
Clinical Social Worker facilitated patient discharge including contacting patient family and facility to confirm patient discharge plans.  Clinical information faxed to facility and family agreeable with plan.  CSW arranged ambulance transport via [facility] to [place].  RN to call report prior to discharge.  Clinical Social Worker will sign off for now as social work intervention is no longer needed. Please consult [place] again if new need arises.  Barbette Or, [place]

## 2015-07-22 NOTE — Telephone Encounter (Signed)
AD,  After checking with PCC's and Dr. Annamaria Boots; it would be best to wait until patient is dc'd from SNF and follow up care with sleep MD here in the office. This will help ensure coverage through Insurance.

## 2015-07-22 NOTE — Telephone Encounter (Signed)
  Pt was beeing seen at [facility]. Pt is to be dc'd to SNF today. He needs to have a split night study.  Can we check if this can be done while he is at the SNF or do we need to wait for him to be discharged form the SNF?  Thanks. pls let me know what you find out.    Monica Becton, MD 07/22/2015, 1:46 PM [facility] Pager [phone] After 3 pm or if no answer, call [phone]

## 2015-07-22 NOTE — Progress Notes (Signed)
Family Medicine Teaching Service Daily Progress Note Intern Pager: [phone]  Patient name: [name] Medical record number: [id number] Date of birth: [birth year] Age: 70 y.o. Gender: male  Primary Care Provider: Chesley Noon, MD Consultants: GI (signed off), CCM Code Status: Full  Pt Overview and Major Events to Date:  3/20 Admitted for AMS after abrupt cessation of ETOH. Treated as hepatic encephalopathy. 3/21 Became somnolent/obtunded. PCCM consulted for hypercapneic resp failure. Started on rocephin.  3/24 Attempted extubation, promptly failed / reintubated; GI signed off  3/26 Weaning on PSV  3/27  Extubated and doing well.  3/28  Rocephin stopped 3/29  Trialled off of BiPap last night; maintained oxygen saturations 3/30  Continue nightly BiPap 4/2  Sepsis. DNR   Assessment and Plan: [name] is a 70 y.o. male presenting with worsening encephalopathy. 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 He required intubation from 3/21-3/27 thought to be due to severe sleep apnea.   Sepsis: resolved. Blood and urine cultures NGTD. Repeat CXR with some improvement of infiltrate/atelectasis -Vanc and Zosyn (4/2-4/4) -Levaquin 750 mg 7/5 and 7/7. -Midodrine 5 mg twice.  -D/cd atenolol for low BP's -Monitor respiratory status with continuous pulse oximetry -Continue nightly BiPAP and plan to continue at SNF  -PT consulted and recommended SNF.  Encephalopathy: improved. Patient AAO x4. Likely related to hepatic encephalopathy with TIPs.  - Increase lactulose 30 mg to four times a day  - Continue rifaximin 400mg  TID - Continue thiamine 100 mg daily  EtOH cirrhosis: MELD-Na score 14, 6% mortality within 3 months. EtOH < 5, AST 256, ALT 66, Ammonia 96 on admission. S/p TIPS in 2016.   - Continue lactulose and rifaximin as above -  Hold atenolol for now  AKI: improved. 1.32>1.46>1.64>1.42 (1.96 on admission). Baseline ~ 1.2-1.3. FENA suggestive for ATN.  -Avoid nephrotoxic medications  Urinary retention: resolved. Bladder scan 26 mls on 04/05 - d/cd cath foley on 04/04  Penile ulcer: Improving. Does not look infected.  No leukocytosis.  - RPR nonreactive, Gc/chlamydia negative  Snoring at rest: likely severe sleep apnea - Page pulmonologist Dr. Corrie Dandy to help arrange sleep study once patient cleared for d/c.   S/p esophagectomy: - Protonix   FEN/GI:  -regular diet -saline lock.   Prophylaxis: SCD given thrmbocytopenia  Disposition: SNF  Subjective: No acute event overnight. Denies chest pain, shortness of breath & abdominal pain. Eating and drinking well. Had small BM yesterday.  Objective: Temp:  [97.5 F (36.4 C)-97.8 F (36.6 C)] 97.5 F (36.4 C) (04/07 0455) Pulse Rate:  [63-86] 63 (04/07 0455) Resp:  [17-19] 19 (04/07 0455) BP: (87-142)/(49-79) 142/79 mmHg (04/07 0455) SpO2:  [97 %-100 %] 100 % (04/07 0455) Weight:  [214 lb 1.1 oz (97.1 kg)] 214 lb 1.1 oz (97.1 kg) (04/07 0455)   Physical Exam: General: lying in bed, awake, responding to questions appropriately Eyes: PERRL, EOMI, no ptosis Cardiovascular: RRR, S1, S2 Chest: Moving good air throughout all lung fields, no crackles today Abdomen: +BS, soft, NT, resonant to percussion GU: lesion over the dorsum of his penis. No sign of infection or cellulitis. Condom catheter in place Neuro: AOx4, no gross deficit  Laboratory:  Recent Labs Lab 07/19/15 0956 07/20/15 0915 07/21/15 0446  WBC 5.8 5.2 4.1  HGB 9.7* 10.4* 9.4*  HCT 28.6* 30.8* 27.3*  PLT 104* 107* 111*    Recent Labs Lab 07/16/15 0308  07/19/15 0300 07/20/15 0915 07/21/15 0446  NA 131*  < > 135 135 135  K 4.4  < > 3.6 4.0 4.0  CL 98*  < > 105 101 100*  CO2 21*  < > 20* 24 25  BUN 19  < > 16 12 11   CREATININE 1.44*  < > 1.46* 1.68* 1.42*  CALCIUM 9.4  < >  8.2* 8.5* 8.5*  PROT 7.0  --   --   --   --   BILITOT 1.7*  --   --   --   --   ALKPHOS 147*  --   --   --   --   ALT 50  --   --   --   --   AST 80*  --   --   --   --   GLUCOSE 143*  < > 88 102* 82  < > = values in this interval not displayed.  Imaging/Diagnostic Tests: No results found.  Mercy Riding, MD 07/22/2015, 7:02 AM PGY-1, [place] Intern pager: [phone], text pages welcome

## 2015-07-22 NOTE — Consult Note (Signed)
   [facility] CM Inpatient Consult   07/22/2015  Jaymier Edmundson [birth year] [id number] Patient was screened for [place] Management services for progression meeting today.  Checked patient's insurance and he no longer has an insurance with the [place]. Thank you for this consult.  Patient evaluated for [place] Management services.  Patient is not eligible for [facility] Care Management services because his/ insurance is not [facility] affiliated.   This patient is Not eligible for [facility] Care Management Services.   Reason:  Not a beneficiary currently attributed to one of the [place].  Membership roster was used to verify non- eligible status. Natividad Brood, RN BSN [facility] Liaison  [phone] business mobile phone Toll free office [phone]

## 2015-07-22 NOTE — Progress Notes (Signed)
Report givento [place] to [facility] Transported by [employer]. Wife in attendance.

## 2015-07-23 LAB — CULTURE, BLOOD (ROUTINE X 2)
Culture: NO GROWTH
Culture: NO GROWTH

## 2015-08-04 ENCOUNTER — Inpatient Hospital Stay (HOSPITAL_COMMUNITY)
Admission: AD | Admit: 2015-08-04 | Discharge: 2015-08-10 | DRG: 442 | Disposition: A | Discharge status: Skilled Nursing Facility | Payer: Medicare HMO | Source: Other Acute Inpatient Hospital | Attending: Family Medicine | Admitting: Family Medicine

## 2015-08-04 ENCOUNTER — Encounter (HOSPITAL_COMMUNITY): Payer: Self-pay | Admitting: General Practice

## 2015-08-04 ENCOUNTER — Inpatient Hospital Stay (HOSPITAL_COMMUNITY): Payer: Medicare HMO

## 2015-08-04 DIAGNOSIS — E870 Hyperosmolality and hypernatremia: Secondary | ICD-10-CM | POA: Diagnosis present

## 2015-08-04 DIAGNOSIS — Z9221 Personal history of antineoplastic chemotherapy: Secondary | ICD-10-CM | POA: Diagnosis not present

## 2015-08-04 DIAGNOSIS — K219 Gastro-esophageal reflux disease without esophagitis: Secondary | ICD-10-CM | POA: Diagnosis present

## 2015-08-04 DIAGNOSIS — K7682 Hepatic encephalopathy: Secondary | ICD-10-CM | POA: Diagnosis present

## 2015-08-04 DIAGNOSIS — E872 Acidosis: Secondary | ICD-10-CM | POA: Diagnosis present

## 2015-08-04 DIAGNOSIS — R680 Hypothermia, not associated with low environmental temperature: Secondary | ICD-10-CM | POA: Diagnosis not present

## 2015-08-04 DIAGNOSIS — R319 Hematuria, unspecified: Secondary | ICD-10-CM | POA: Diagnosis not present

## 2015-08-04 DIAGNOSIS — N485 Ulcer of penis: Secondary | ICD-10-CM | POA: Diagnosis not present

## 2015-08-04 DIAGNOSIS — Z66 Do not resuscitate: Secondary | ICD-10-CM | POA: Diagnosis present

## 2015-08-04 DIAGNOSIS — Z8501 Personal history of malignant neoplasm of esophagus: Secondary | ICD-10-CM | POA: Diagnosis not present

## 2015-08-04 DIAGNOSIS — K7290 Hepatic failure, unspecified without coma: Secondary | ICD-10-CM | POA: Diagnosis present

## 2015-08-04 DIAGNOSIS — F1010 Alcohol abuse, uncomplicated: Secondary | ICD-10-CM | POA: Diagnosis not present

## 2015-08-04 DIAGNOSIS — N170 Acute kidney failure with tubular necrosis: Secondary | ICD-10-CM | POA: Diagnosis present

## 2015-08-04 DIAGNOSIS — I129 Hypertensive chronic kidney disease with stage 1 through stage 4 chronic kidney disease, or unspecified chronic kidney disease: Secondary | ICD-10-CM | POA: Diagnosis not present

## 2015-08-04 DIAGNOSIS — Z79899 Other long term (current) drug therapy: Secondary | ICD-10-CM

## 2015-08-04 DIAGNOSIS — I959 Hypotension, unspecified: Secondary | ICD-10-CM | POA: Diagnosis present

## 2015-08-04 DIAGNOSIS — N179 Acute kidney failure, unspecified: Secondary | ICD-10-CM | POA: Insufficient documentation

## 2015-08-04 DIAGNOSIS — G9340 Encephalopathy, unspecified: Secondary | ICD-10-CM | POA: Diagnosis not present

## 2015-08-04 DIAGNOSIS — Z923 Personal history of irradiation: Secondary | ICD-10-CM | POA: Diagnosis not present

## 2015-08-04 DIAGNOSIS — E861 Hypovolemia: Secondary | ICD-10-CM | POA: Diagnosis not present

## 2015-08-04 DIAGNOSIS — Z87891 Personal history of nicotine dependence: Secondary | ICD-10-CM

## 2015-08-04 DIAGNOSIS — E860 Dehydration: Secondary | ICD-10-CM | POA: Diagnosis present

## 2015-08-04 DIAGNOSIS — K7031 Alcoholic cirrhosis of liver with ascites: Secondary | ICD-10-CM | POA: Diagnosis not present

## 2015-08-04 DIAGNOSIS — R339 Retention of urine, unspecified: Secondary | ICD-10-CM | POA: Diagnosis not present

## 2015-08-04 DIAGNOSIS — N183 Chronic kidney disease, stage 3 (moderate): Secondary | ICD-10-CM | POA: Diagnosis not present

## 2015-08-04 DIAGNOSIS — T17908A Unspecified foreign body in respiratory tract, part unspecified causing other injury, initial encounter: Secondary | ICD-10-CM

## 2015-08-04 DIAGNOSIS — G4733 Obstructive sleep apnea (adult) (pediatric): Secondary | ICD-10-CM | POA: Diagnosis not present

## 2015-08-04 DIAGNOSIS — E876 Hypokalemia: Secondary | ICD-10-CM | POA: Diagnosis present

## 2015-08-04 DIAGNOSIS — R0902 Hypoxemia: Secondary | ICD-10-CM | POA: Diagnosis not present

## 2015-08-04 DIAGNOSIS — R4182 Altered mental status, unspecified: Secondary | ICD-10-CM | POA: Diagnosis present

## 2015-08-04 DIAGNOSIS — M109 Gout, unspecified: Secondary | ICD-10-CM | POA: Diagnosis present

## 2015-08-04 HISTORY — DX: Unspecified osteoarthritis, unspecified site: M19.90

## 2015-08-04 HISTORY — DX: Sleep apnea, unspecified: G47.30

## 2015-08-04 LAB — BASIC METABOLIC PANEL
Anion gap: 11 (ref 5–15)
BUN: 19 mg/dL (ref 6–20)
BUN: 19 mg/dL (ref 6–20)
BUN: 20 mg/dL (ref 6–20)
CALCIUM: 9.9 mg/dL (ref 8.9–10.3)
CALCIUM: 9 mg/dL (ref 8.9–10.3)
CALCIUM: 9.3 mg/dL (ref 8.9–10.3)
CO2: 22 mmol/L (ref 22–32)
CO2: 19 mmol/L — ABNORMAL LOW (ref 22–32)
CO2: 20 mmol/L — ABNORMAL LOW (ref 22–32)
Chloride: >130 mmol/L (ref 101–111)
Chloride: >130 mmol/L (ref 101–111)
Chloride: 129 mmol/L — ABNORMAL HIGH (ref 101–111)
Creatinine, Ser: 2.44 mg/dL — ABNORMAL HIGH (ref 0.61–1.24)
Creatinine, Ser: 2.11 mg/dL — ABNORMAL HIGH (ref 0.61–1.24)
Creatinine, Ser: 2.57 mg/dL — ABNORMAL HIGH (ref 0.61–1.24)
GFR calc Af Amer: 29 mL/min — ABNORMAL LOW (ref >60)
GFR calc Af Amer: 34 mL/min — ABNORMAL LOW (ref >60)
GFR calc Af Amer: 27 mL/min — ABNORMAL LOW (ref >60)
GFR, EST NON AFRICAN AMERICAN: 25 mL/min — AB (ref >60)
GFR, EST NON AFRICAN AMERICAN: 30 mL/min — AB (ref >60)
GFR, EST NON AFRICAN AMERICAN: 23 mL/min — AB (ref >60)
GLUCOSE: 84 mg/dL (ref 65–99)
GLUCOSE: 105 mg/dL — AB (ref 65–99)
Glucose, Bld: 82 mg/dL (ref 65–99)
POTASSIUM: 2.9 mmol/L — AB (ref 3.5–5.1)
POTASSIUM: 2.7 mmol/L — AB (ref 3.5–5.1)
POTASSIUM: 3 mmol/L — AB (ref 3.5–5.1)
SODIUM: 167 mmol/L — AB (ref 135–145)
SODIUM: 163 mmol/L — AB (ref 135–145)
SODIUM: 160 mmol/L — AB (ref 135–145)

## 2015-08-04 LAB — COMPREHENSIVE METABOLIC PANEL
ALBUMIN: 3.5 g/dL (ref 3.5–5.0)
ALT: 30 U/L (ref 17–63)
AST: 41 U/L (ref 15–41)
Alkaline Phosphatase: 157 U/L — ABNORMAL HIGH (ref 38–126)
BILIRUBIN TOTAL: 1.8 mg/dL — AB (ref 0.3–1.2)
BUN: 19 mg/dL (ref 6–20)
CO2: 22 mmol/L (ref 22–32)
Calcium: 10.8 mg/dL — ABNORMAL HIGH (ref 8.9–10.3)
Creatinine, Ser: 2.08 mg/dL — ABNORMAL HIGH (ref 0.61–1.24)
GFR calc Af Amer: 35 mL/min — ABNORMAL LOW (ref >60)
GFR calc non Af Amer: 30 mL/min — ABNORMAL LOW (ref >60)
GLUCOSE: 99 mg/dL (ref 65–99)
POTASSIUM: 3.5 mmol/L (ref 3.5–5.1)
Sodium: 168 mmol/L (ref 135–145)
TOTAL PROTEIN: 7.4 g/dL (ref 6.5–8.1)

## 2015-08-04 LAB — CBC WITH DIFFERENTIAL/PLATELET
BASOS ABS: 0.1 10*3/uL (ref 0.0–0.1)
Basophils Relative: 1 %
EOS ABS: 0.4 10*3/uL (ref 0.0–0.7)
EOS PCT: 8 %
HCT: 34.6 % — ABNORMAL LOW (ref 39.0–52.0)
Hemoglobin: 11.4 g/dL — ABNORMAL LOW (ref 13.0–17.0)
LYMPHS ABS: 0.8 10*3/uL (ref 0.7–4.0)
Lymphocytes Relative: 15 %
MCH: 33.7 pg (ref 26.0–34.0)
MCHC: 32.9 g/dL (ref 30.0–36.0)
MCV: 102.4 fL — ABNORMAL HIGH (ref 78.0–100.0)
MONO ABS: 0.7 10*3/uL (ref 0.1–1.0)
Monocytes Relative: 13 %
NEUTROS PCT: 63 %
Neutro Abs: 3.4 10*3/uL (ref 1.7–7.7)
PLATELETS: 104 10*3/uL — AB (ref 150–400)
RBC: 3.38 MIL/uL — AB (ref 4.22–5.81)
RDW: 16.1 % — AB (ref 11.5–15.5)
WBC: 5.4 10*3/uL (ref 4.0–10.5)

## 2015-08-04 LAB — GLUCOSE, CAPILLARY: Glucose-Capillary: 81 mg/dL (ref 65–99)

## 2015-08-04 LAB — URINALYSIS, ROUTINE W REFLEX MICROSCOPIC
GLUCOSE, UA: NEGATIVE mg/dL
Ketones, ur: 15 mg/dL — AB
Nitrite: NEGATIVE
PROTEIN: 100 mg/dL — AB
SPECIFIC GRAVITY, URINE: 1.034 — AB (ref 1.005–1.030)
pH: 5.5 (ref 5.0–8.0)

## 2015-08-04 LAB — URINE MICROSCOPIC-ADD ON

## 2015-08-04 LAB — RAPID URINE DRUG SCREEN, HOSP PERFORMED
AMPHETAMINES: NOT DETECTED
BARBITURATES: NOT DETECTED
BENZODIAZEPINES: POSITIVE — AB
COCAINE: NOT DETECTED
Opiates: NOT DETECTED
Tetrahydrocannabinol: NOT DETECTED

## 2015-08-04 LAB — LACTIC ACID, PLASMA
LACTIC ACID, VENOUS: 3.4 mmol/L — AB (ref 0.5–2.0)
LACTIC ACID, VENOUS: 3.9 mmol/L — AB (ref 0.5–2.0)
LACTIC ACID, VENOUS: 2.5 mmol/L — AB (ref 0.5–2.0)

## 2015-08-04 LAB — MRSA PCR SCREENING: MRSA BY PCR: NEGATIVE

## 2015-08-04 LAB — CK: CK TOTAL: 75 U/L (ref 49–397)

## 2015-08-04 LAB — PROTIME-INR
INR: 1.33 (ref 0.00–1.49)
Prothrombin Time: 16.6 seconds — ABNORMAL HIGH (ref 11.6–15.2)

## 2015-08-04 LAB — AMMONIA: AMMONIA: 23 umol/L (ref 9–35)

## 2015-08-04 LAB — TROPONIN I: TROPONIN I: 0.04 ng/mL — AB (ref <0.031)

## 2015-08-04 MED ORDER — DEXTROSE 5 % IV SOLN
INTRAVENOUS | Status: DC
  Administered 2015-08-04: 1000 mL via INTRAVENOUS

## 2015-08-04 MED ORDER — LACTULOSE ENEMA
300.0000 mL | Freq: Once | ORAL | Status: DC
  Filled 2015-08-04: qty 300

## 2015-08-04 MED ORDER — PIPERACILLIN-TAZOBACTAM 3.375 G IVPB
3.3750 g | Freq: Three times a day (TID) | INTRAVENOUS | Status: DC
  Administered 2015-08-04 – 2015-08-07 (×11): 3.375 g via INTRAVENOUS
  Filled 2015-08-04 (×12): qty 50

## 2015-08-04 MED ORDER — VANCOMYCIN HCL 10 G IV SOLR
1250.0000 mg | INTRAVENOUS | Status: DC
  Administered 2015-08-05 – 2015-08-07 (×3): 1250 mg via INTRAVENOUS
  Filled 2015-08-04 (×3): qty 1250

## 2015-08-04 MED ORDER — FOLIC ACID 1 MG PO TABS
1.0000 mg | ORAL_TABLET | Freq: Every day | ORAL | Status: DC
  Administered 2015-08-06 – 2015-08-10 (×5): 1 mg via ORAL
  Filled 2015-08-04 (×5): qty 1

## 2015-08-04 MED ORDER — SODIUM CHLORIDE 0.45 % IV SOLN: INTRAVENOUS | Status: DC

## 2015-08-04 MED ORDER — HEPARIN SODIUM (PORCINE) 5000 UNIT/ML IJ SOLN
5000.0000 [IU] | Freq: Three times a day (TID) | INTRAMUSCULAR | Status: DC
  Administered 2015-08-04 – 2015-08-10 (×19): 5000 [IU] via SUBCUTANEOUS
  Filled 2015-08-04 (×18): qty 1

## 2015-08-04 MED ORDER — RIFAXIMIN 200 MG PO TABS
400.0000 mg | ORAL_TABLET | Freq: Three times a day (TID) | ORAL | Status: DC
  Administered 2015-08-06 – 2015-08-09 (×9): 400 mg via ORAL
  Filled 2015-08-04 (×16): qty 2

## 2015-08-04 MED ORDER — THIAMINE HCL 100 MG/ML IJ SOLN
100.0000 mg | Freq: Every day | INTRAMUSCULAR | Status: DC
  Administered 2015-08-04 – 2015-08-09 (×6): 100 mg via INTRAVENOUS
  Filled 2015-08-04 (×6): qty 2

## 2015-08-04 MED ORDER — SODIUM CHLORIDE 0.9% FLUSH
3.0000 mL | Freq: Two times a day (BID) | INTRAVENOUS | Status: DC
  Administered 2015-08-04 – 2015-08-10 (×8): 3 mL via INTRAVENOUS

## 2015-08-04 MED ORDER — LACTULOSE 10 GM/15ML PO SOLN
30.0000 g | Freq: Four times a day (QID) | ORAL | Status: DC
  Administered 2015-08-06 – 2015-08-10 (×16): 30 g via ORAL
  Filled 2015-08-04 (×17): qty 45

## 2015-08-04 MED ORDER — VANCOMYCIN HCL IN DEXTROSE 750-5 MG/150ML-% IV SOLN: 750.0000 mg | Freq: Two times a day (BID) | INTRAVENOUS | Status: DC

## 2015-08-04 MED ORDER — PANTOPRAZOLE SODIUM 40 MG PO TBEC
40.0000 mg | DELAYED_RELEASE_TABLET | Freq: Every day | ORAL | Status: DC
  Administered 2015-08-06 – 2015-08-10 (×5): 40 mg via ORAL
  Filled 2015-08-04 (×5): qty 1

## 2015-08-04 MED ORDER — SODIUM CHLORIDE 0.9 % IV BOLUS (SEPSIS)
1000.0000 mL | Freq: Once | INTRAVENOUS | Status: AC
  Administered 2015-08-04: 1000 mL via INTRAVENOUS

## 2015-08-04 MED ORDER — DEXTROSE 5 % IV SOLN: INTRAVENOUS | Status: DC

## 2015-08-04 MED ORDER — DEXTROSE 5 % IV SOLN
INTRAVENOUS | Status: DC
  Administered 2015-08-04 – 2015-08-07 (×8): via INTRAVENOUS
  Filled 2015-08-04 (×11): qty 1000

## 2015-08-04 MED ORDER — SODIUM CHLORIDE 0.9 % IV SOLN
1250.0000 mg | Freq: Once | INTRAVENOUS | Status: AC
  Administered 2015-08-04: 1250 mg via INTRAVENOUS
  Filled 2015-08-04: qty 1250

## 2015-08-04 MED ORDER — VITAMIN B-1 100 MG PO TABS: 100.0000 mg | ORAL_TABLET | Freq: Every day | ORAL | Status: DC

## 2015-08-04 MED ORDER — BUDESONIDE 0.5 MG/2ML IN SUSP
0.5000 mg | Freq: Two times a day (BID) | RESPIRATORY_TRACT | Status: DC
  Administered 2015-08-04 – 2015-08-10 (×12): 0.5 mg via RESPIRATORY_TRACT
  Filled 2015-08-04 (×12): qty 2

## 2015-08-04 MED ORDER — SODIUM CHLORIDE 0.9 % IV BOLUS (SEPSIS)
2000.0000 mL | Freq: Once | INTRAVENOUS | Status: AC
  Administered 2015-08-04: 1000 mL via INTRAVENOUS

## 2015-08-04 MED ORDER — POTASSIUM CHLORIDE 10 MEQ/100ML IV SOLN
10.0000 meq | INTRAVENOUS | Status: AC
  Administered 2015-08-04 – 2015-08-05 (×4): 10 meq via INTRAVENOUS
  Filled 2015-08-04 (×4): qty 100

## 2015-08-04 NOTE — Progress Notes (Signed)
Pharmacy Antibiotic Note  [name] is a 70 y.o. male admitted on 08/04/2015 with sepsis.  Pharmacy has been consulted for Vancomycin and Zosyn dosing.  Plan: 70yo male to start Vancomycin and Zosyn for R/O sepsis, no current progress note available.  Pt with previous admission earlier this month and he was receiving Vancomycin 1000mg  IV q12 at that time, with Cr increasing from 1.33 to 1.68 & decr to 1.42 (CrCl ~55) at discharge.  CMet is pending at this time.  1-  Vancomycin 1250mg  IV x 1, then 750mg  IV q12 2-  Zosyn 3.375g IV q8, infusing over 4hr 3-  F/U lab results and adjust dosing as needed 4-  Trend clinical status, f/u cx 5-  Vanc trough at steady state     Temp (24hrs), Avg:95 F (35 C), Min:94 F (34.4 C), Max:96 F (35.6 C)  No results for input(s): WBC, CREATININE, LATICACIDVEN, VANCOTROUGH, VANCOPEAK, VANCORANDOM, GENTTROUGH, GENTPEAK, GENTRANDOM, TOBRATROUGH, TOBRAPEAK, TOBRARND, AMIKACINPEAK, AMIKACINTROU, AMIKACIN in the last 168 hours.  Estimated Creatinine Clearance: 54.7 mL/min (by C-G formula based on Cr of 1.42).    Allergies  Allergen Reactions  . Amoxicillin Diarrhea    Antimicrobials this admission: Vanc 4/20 >>  Zosyn 4/20 >>    Microbiology results: 4/20 BCx:  4/20 UCx:     Thank you for allowing pharmacy to be a part of this patient's care.  Jaquita Folds 08/04/2015 10:32 AM

## 2015-08-04 NOTE — Progress Notes (Signed)
Dr. Juleen China phoned with K+ of 2.9

## 2015-08-04 NOTE — H&P (Signed)
[facility] Admission History and Physical Service Pager: [phone]  Patient name: [name] Medical record number: [id number] Date of birth: [birth year] Age: 70 y.o. Gender: male  Primary Care Provider: Chesley Noon, MD Consultants:  Code Status: DNR  Chief Complaint: patient unable to state due to mental status  Assessment and Plan: [name] is a 70 y.o. male presenting with hepatic encephalopathy. 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 Not responsive to questions or commands currently.  - admit to SDU - labs = CBC, ammonia, CMP, lactic acid, INR, troponin, UA - sepsis = blood cx x2, pip/zosyn, will de-escalate as improves/cultures come back - continue lactulose and rifaximin  Addendum # Severe hypernatremia: Na+ 168. With appearance of mild dehydration and low BP, hypovolemic hypernatremia. Fluid deficit = 9.7 L.  - 1 L NS bolus, then run D5W 100cc/hr (max infusion rate would be 147cc/hr to correct 0.40meq/cc/hr based on initial calculation) - repeat BMP 2 hours post bolus.  # AKI / CKD: pending updated Cr. Last hospitalization he was around 1.3-1.6. He was having issues with urinary retention  - will do slightly sub-maintenance fluids until labs come back, 1/2NS @ 100cc/hr  # Snoring/?Sleep apnea: followed by outside pulmonology Dr. Corrie Dandy, was going to schedule outpatient sleep study - pulse ox - also restart home pulmicort nebs  # Penile ulcer: negative RPR and GC/C, overall appears to be resolving.  - monitor clinically  # S/p esophagectomy - continue home PPI/protonix  FEN/GI: npo sips/meds until improved mental status Prophylaxis: heparin sq  Disposition: admit  History of Present Illness:  [name] is a 70 y.o. male  presenting with encephalopathy. History is limited by patient mental status, he does not respond to questions or commands. History is entirely by chart review from previous admission at [facility].   Patient recently admitted to [facility] from 07/04/15 to 07/22/15 for encephalopathy. He was intubated and under CCM service from 3/21-3/27. His workup was negative for stroke, believed entirely due to hepatic encephalopathy with improvement after restarting lacutlose and adding rifaximin. After discharge it appears he re-presented to the ED at [facility] on 07/29/15 with encephalopathy. He was treated with fluids, lactulose per rectum, ativan, with GI consult. He was not improving despite these treatments and the family requested that he [facility].  Review Of Systems: Unable to obtain due to patient mental status  Patient Active Problem List   Diagnosis Date Noted  . Aspiration pneumonia of right middle lobe ([place])   . Sepsis ([place])   . Encephalopathy acute   . HCAP (healthcare-associated pneumonia)   . Encephalopathy   . Alcohol withdrawal ([place]) 07/12/2015  . Respiratory failure with hypoxia and hypercapnia ([place]) 07/05/2015  . Abdominal distention   . Alcohol abuse   . Cirrhosis ([place])   . Penile ulcer   . Acute respiratory failure with hypoxia and hypercapnia (H[facility])   . Hepatic encephalopathy ([place]) 07/04/2015  . Acid indigestion 01/05/2015  . Cancer of esophagus ([place]) 01/05/2015  . Gout 01/05/2015  . S/P TIPS (transjugular intrahepatic portosystemic shunt)   . Ascites-treated 06/14/2014  . Abnormal magnetic resonance imaging study 05/16/2012  . Chronic cervical pain 05/16/2012  . Acute infection of nasal sinus 05/15/2012    Past Medical History: Past Medical History  Diagnosis Date  . Hypertension  under control  . History of cancer chemotherapy   . Hx of radiation therapy 2002  . Gout     under control  . Umbilical hernia   . Fatty liver     "under control, being  monitored"  . Esophageal cancer ([place]) 2002    esophagus removed  . Shortness of breath dyspnea   . GERD (gastroesophageal reflux disease)   . Alcoholic cirrhosis ([place])     Past Surgical History: Past Surgical History  Procedure Laterality Date  . Esophagus surgery  2002  . Incisional hernia repair  2004  . Tonsillectomy  as child  . Umbilical hernia repair N/A 06/14/2014    Procedure:  OPEN UMBILICAL HERNIA REPAIR;  Surgeon: Pedro Earls, MD;  Location: [facility];  Service: General;  Laterality: N/A;  . Tonsillectomy    . Radiology with anesthesia N/A 02/09/2015    Procedure: TIPS;  Surgeon: Jacqulynn Cadet, MD;  Location: [facility];  Service: Radiology;  Laterality: N/A;    Social History: Social History  Substance Use Topics  . Smoking status: Former Smoker -- 0.50 packs/day for 40 years    Types: Cigarettes    Quit date: 04/16/2004  . Smokeless tobacco: Never Used  . Alcohol Use: 4.2 oz/week    7 Glasses of wine per week     Comment: wine daily   Additional social history:  Please also refer to relevant sections of EMR.  Family History: Family History  Problem Relation Age of Onset  . Diabetes Father   . Diabetes Brother    Allergies and Medications: Allergies  Allergen Reactions  . Amoxicillin Diarrhea   No current facility-administered medications on file prior to encounter.   Current Outpatient Prescriptions on File Prior to Encounter  Medication Sig Dispense Refill  . allopurinol (ZYLOPRIM) 300 MG tablet Take 300 mg by mouth daily.     . budesonide (PULMICORT) 0.5 MG/2ML nebulizer solution Take 2 mLs (0.5 mg total) by nebulization 2 (two) times daily. 0.5 mL 12  . folic acid (FOLVITE) 1 MG tablet Take 1 tablet (1 mg total) by mouth daily. 30 tablet 0  . ipratropium-albuterol (DUONEB) 0.5-2.5 (3) MG/3ML SOLN Take 3 mLs by nebulization 2 (two) times daily. 360 mL 0  . lactulose (CHRONULAC) 10 GM/15ML solution Take 45 mLs (30 g total) by mouth 4 (four) times  daily. Titrate for 2 bowel movements a day. 240 mL 0  . midodrine (PROAMATINE) 5 MG tablet Take 1 tablet (5 mg total) by mouth 3 (three) times daily with meals. 30 tablet 0  . Multiple Vitamin (MULTIVITAMIN WITH MINERALS) TABS tablet Take 1 tablet by mouth daily. 30 tablet 0  . omeprazole (PRILOSEC) 20 MG capsule Take 20 mg by mouth daily.     . rifaximin (XIFAXAN) 200 MG tablet Take 2 tablets (400 mg total) by mouth 3 (three) times daily. 90 tablet 0  . thiamine 100 MG tablet Take 1 tablet (100 mg total) by mouth daily. 30 tablet 0    Objective: BP 103/76 mmHg  Pulse 76  Temp(Src) 94 F (34.4 C) (Rectal)  Resp 19  SpO2 96% Exam: General: snoring in bed, not easily awoken, when finally does awaken he moans and rolls in bed Eyes: PERRL, did not observe spontaneous extraocular movements, not really icteric ENTM: dry mucous membranes, did not observe any oropharyngeal lesions Neck: supple Cardiovascular: normal rate, regular rhythm, no murmur appreciated Respiratory: transmitted upper airway noises/snoring, has borderline high normal rate, no appreciable crackles, normal work  of breathing. Abdomen: soft, not distended,  MSK: thin muscle mass, no LE edema. Skin: scattered ulcers, various healing. There is an ulcer on the glans of his penis.  Neuro: not alert, not oriented, does not follow any commands. He spontaneously moves all 4 limbs while rolling in the bed. Psych: non-verbal, just moans.  Labs and Imaging: CBC BMET  No results for input(s): WBC, HGB, HCT, PLT in the last 168 hours. No results for input(s): NA, K, CL, CO2, BUN, CREATININE, GLUCOSE, CALCIUM in the last 168 hours.   Pending labs  Leone Brand, MD 08/04/2015, 10:22 AM PGY-3, [place] Intern pager: [phone], text pages welcome

## 2015-08-04 NOTE — Progress Notes (Signed)
Na+ 167; CHL >130. Reported to Dr. Juleen China. Portable CT scan ordered and the scanner is down at this time and Dr. Juleen China was made aware.

## 2015-08-04 NOTE — Progress Notes (Signed)
Report of Na+ 168; CHL>130; and Lactic acid 3.4 called to Dr. Juleen China.

## 2015-08-04 NOTE — Progress Notes (Signed)
Lactic Acid 3.9 phoned to Dr. Juleen China

## 2015-08-04 NOTE — Progress Notes (Signed)
08/04/15  Pharmacy- Vancomycin 1255   Cr 2.08  A/P:  Cr up from previous earlier this month, AKI and dehydration.  Calculated CrCl 24ml/min, will adjust Vancomycin dose.  1- Change Vancomycin to 1250mg  IV q24 2-  Watch renal fxn and adjust dosing as needed.  Gracy Bruins, PharmD Clinical Pharmacist [facility]

## 2015-08-04 NOTE — Progress Notes (Addendum)
Pt noted with bruising and multiple scrapes/abrasions to lower ext. Wife admits that patient has frequent falls.

## 2015-08-04 NOTE — Progress Notes (Addendum)
FMTS Interim Note  Patient's initial labs reviewed. Critical values of NA 168. Cl >130, and lactic acid of 3.4 noted. Last recorded sodium of 135 two weeks ago. Unclear if this is chronic or acute as no labs for the past 3 days are available for review.   Plan: Hypernatremia: Free water deficit calculated to be 9.7L. Will correct with a goal of lowering 10 mmol/dl per day.  - Will D5W start at 1.14ml/hr x weight in kg per UpToDate recommendations. - Check BMP this PM to ensure not correcting too rapidly. Then q4.  AKI: Likely prerenal. - Will give 1L NS bolus and D5W as above - Will check CK - Monitor I/Os, check bladder scan if not producing urine  Lactic Acidosis. Likely secondary to dehydration and AKI, though patient also has concern for sepsis. Hypothermic now, but HR and BP stable.  - Trend lactic acid - IVF as above - Continue broad spectrum antibiotics - Cultures pending.   [name]. [name], [name] Resident PGY-2 08/04/2015 12:36 PM   Addendum: Discussed patient with wife at bedside. Said that he was doing well at SNF until about a week ago when he became more confused. He was compliant with his lactulose. He was admitted to [facility] due to worsening mental status. His wife last saw the patient on 08/01/2015 when she was told that he would be transferred to [facility] for further evaluation and possible reversal of his TIPS procedure. She called the hospital daily since then but was told that they were waiting on a bed and that his clinical status had not changed.  She denied any history of head trauma or falls. No known changes in his medication regimen.

## 2015-08-05 DIAGNOSIS — K7290 Hepatic failure, unspecified without coma: Principal | ICD-10-CM

## 2015-08-05 DIAGNOSIS — N179 Acute kidney failure, unspecified: Secondary | ICD-10-CM | POA: Insufficient documentation

## 2015-08-05 DIAGNOSIS — N485 Ulcer of penis: Secondary | ICD-10-CM

## 2015-08-05 DIAGNOSIS — E870 Hyperosmolality and hypernatremia: Secondary | ICD-10-CM | POA: Insufficient documentation

## 2015-08-05 LAB — BASIC METABOLIC PANEL
ANION GAP: 11 (ref 5–15)
Anion gap: 12 (ref 5–15)
Anion gap: 12 (ref 5–15)
Anion gap: 9 (ref 5–15)
Anion gap: 10 (ref 5–15)
Anion gap: 14 (ref 5–15)
Anion gap: 12 (ref 5–15)
BUN: 20 mg/dL (ref 6–20)
BUN: 18 mg/dL (ref 6–20)
BUN: 20 mg/dL (ref 6–20)
BUN: 20 mg/dL (ref 6–20)
BUN: 19 mg/dL (ref 6–20)
BUN: 18 mg/dL (ref 6–20)
BUN: 17 mg/dL (ref 6–20)
CALCIUM: 8.6 mg/dL — AB (ref 8.9–10.3)
CHLORIDE: 128 mmol/L — AB (ref 101–111)
CHLORIDE: 120 mmol/L — AB (ref 101–111)
CHLORIDE: 125 mmol/L — AB (ref 101–111)
CHLORIDE: 128 mmol/L — AB (ref 101–111)
CHLORIDE: 120 mmol/L — AB (ref 101–111)
CHLORIDE: 123 mmol/L — AB (ref 101–111)
CO2: 21 mmol/L — AB (ref 22–32)
CO2: 19 mmol/L — AB (ref 22–32)
CO2: 21 mmol/L — AB (ref 22–32)
CO2: 21 mmol/L — AB (ref 22–32)
CO2: 21 mmol/L — AB (ref 22–32)
CO2: 20 mmol/L — ABNORMAL LOW (ref 22–32)
CO2: 22 mmol/L (ref 22–32)
CREATININE: 2.68 mg/dL — AB (ref 0.61–1.24)
CREATININE: 2.79 mg/dL — AB (ref 0.61–1.24)
CREATININE: 2.9 mg/dL — AB (ref 0.61–1.24)
CREATININE: 2.82 mg/dL — AB (ref 0.61–1.24)
Calcium: 9.2 mg/dL (ref 8.9–10.3)
Calcium: 9.1 mg/dL (ref 8.9–10.3)
Calcium: 9.4 mg/dL (ref 8.9–10.3)
Calcium: 10.6 mg/dL — ABNORMAL HIGH (ref 8.9–10.3)
Calcium: 9.9 mg/dL (ref 8.9–10.3)
Calcium: 9.8 mg/dL (ref 8.9–10.3)
Chloride: 124 mmol/L — ABNORMAL HIGH (ref 101–111)
Creatinine, Ser: 2.56 mg/dL — ABNORMAL HIGH (ref 0.61–1.24)
Creatinine, Ser: 2.93 mg/dL — ABNORMAL HIGH (ref 0.61–1.24)
Creatinine, Ser: 2.95 mg/dL — ABNORMAL HIGH (ref 0.61–1.24)
GFR calc Af Amer: 26 mL/min — ABNORMAL LOW (ref >60)
GFR calc Af Amer: 25 mL/min — ABNORMAL LOW (ref >60)
GFR calc Af Amer: 23 mL/min — ABNORMAL LOW (ref >60)
GFR calc Af Amer: 24 mL/min — ABNORMAL LOW (ref >60)
GFR calc Af Amer: 23 mL/min — ABNORMAL LOW (ref >60)
GFR calc Af Amer: 23 mL/min — ABNORMAL LOW (ref >60)
GFR calc non Af Amer: 22 mL/min — ABNORMAL LOW (ref >60)
GFR calc non Af Amer: 23 mL/min — ABNORMAL LOW (ref >60)
GFR calc non Af Amer: 21 mL/min — ABNORMAL LOW (ref >60)
GFR calc non Af Amer: 20 mL/min — ABNORMAL LOW (ref >60)
GFR calc non Af Amer: 21 mL/min — ABNORMAL LOW (ref >60)
GFR, EST AFRICAN AMERICAN: 27 mL/min — AB (ref >60)
GFR, EST NON AFRICAN AMERICAN: 20 mL/min — AB (ref >60)
GFR, EST NON AFRICAN AMERICAN: 20 mL/min — AB (ref >60)
GLUCOSE: 103 mg/dL — AB (ref 65–99)
GLUCOSE: 129 mg/dL — AB (ref 65–99)
GLUCOSE: 104 mg/dL — AB (ref 65–99)
Glucose, Bld: 416 mg/dL — ABNORMAL HIGH (ref 65–99)
Glucose, Bld: 127 mg/dL — ABNORMAL HIGH (ref 65–99)
Glucose, Bld: 101 mg/dL — ABNORMAL HIGH (ref 65–99)
Glucose, Bld: 262 mg/dL — ABNORMAL HIGH (ref 65–99)
POTASSIUM: 3.4 mmol/L — AB (ref 3.5–5.1)
POTASSIUM: 5 mmol/L (ref 3.5–5.1)
POTASSIUM: 3.3 mmol/L — AB (ref 3.5–5.1)
POTASSIUM: 3.8 mmol/L (ref 3.5–5.1)
Potassium: 3.5 mmol/L (ref 3.5–5.1)
Potassium: 3.3 mmol/L — ABNORMAL LOW (ref 3.5–5.1)
Potassium: 3.2 mmol/L — ABNORMAL LOW (ref 3.5–5.1)
Sodium: 161 mmol/L (ref 135–145)
Sodium: 150 mmol/L — ABNORMAL HIGH (ref 135–145)
Sodium: 158 mmol/L — ABNORMAL HIGH (ref 135–145)
Sodium: 158 mmol/L — ABNORMAL HIGH (ref 135–145)
Sodium: 151 mmol/L — ABNORMAL HIGH (ref 135–145)
Sodium: 158 mmol/L — ABNORMAL HIGH (ref 135–145)
Sodium: 157 mmol/L — ABNORMAL HIGH (ref 135–145)

## 2015-08-05 LAB — GLUCOSE, CAPILLARY: Glucose-Capillary: 71 mg/dL (ref 65–99)

## 2015-08-05 LAB — LACTIC ACID, PLASMA: LACTIC ACID, VENOUS: 2.2 mmol/L — AB (ref 0.5–2.0)

## 2015-08-05 LAB — CBC
HEMATOCRIT: 31.2 % — AB (ref 39.0–52.0)
HEMOGLOBIN: 10.3 g/dL — AB (ref 13.0–17.0)
MCH: 33.6 pg (ref 26.0–34.0)
MCHC: 33 g/dL (ref 30.0–36.0)
MCV: 101.6 fL — AB (ref 78.0–100.0)
Platelets: 102 10*3/uL — ABNORMAL LOW (ref 150–400)
RBC: 3.07 MIL/uL — AB (ref 4.22–5.81)
RDW: 16.2 % — ABNORMAL HIGH (ref 11.5–15.5)
WBC: 5.6 10*3/uL (ref 4.0–10.5)

## 2015-08-05 LAB — MAGNESIUM: Magnesium: 1.7 mg/dL (ref 1.7–2.4)

## 2015-08-05 LAB — PROTIME-INR
INR: 1.32 (ref 0.00–1.49)
PROTHROMBIN TIME: 16.5 s — AB (ref 11.6–15.2)

## 2015-08-05 LAB — AMMONIA: Ammonia: 48 umol/L — ABNORMAL HIGH (ref 9–35)

## 2015-08-05 MED ORDER — LACTULOSE ENEMA
300.0000 mL | Freq: Once | ORAL | Status: AC
  Administered 2015-08-05: 300 mL via RECTAL
  Filled 2015-08-05: qty 300

## 2015-08-05 MED ORDER — ACETAMINOPHEN 500 MG PO TABS: 500.0000 mg | ORAL_TABLET | Freq: Four times a day (QID) | ORAL | Status: DC | PRN

## 2015-08-05 MED ORDER — ACETAMINOPHEN 650 MG RE SUPP
325.0000 mg | Freq: Four times a day (QID) | RECTAL | Status: DC | PRN
  Administered 2015-08-05: 325 mg via RECTAL
  Filled 2015-08-05: qty 1

## 2015-08-05 MED ORDER — POTASSIUM CHLORIDE 10 MEQ/100ML IV SOLN
10.0000 meq | INTRAVENOUS | Status: AC
  Administered 2015-08-05 (×4): 10 meq via INTRAVENOUS
  Filled 2015-08-05 (×4): qty 100

## 2015-08-05 NOTE — Progress Notes (Signed)
CRITICAL VALUE ALERT  Critical value received:  Lactic acid- 2.2 & Na-161  Date of notification:  08/05/15  Time of notification:  [id number]  Critical value read back: yes  Nurse who received alert:  Atilano Ina, RN  MD notified (1st page):  Cyndia Skeeters  Time of first page:  [phone]  Responding MD:  Cyndia Skeeters  Time MD responded:  [phone]

## 2015-08-05 NOTE — Progress Notes (Signed)
Family Medicine Teaching Service Daily Progress Note Intern Pager: [phone]  Patient name: [name] Medical record number: [id number] Date of birth: Sep 11, 1945 Age: 70 y.o. Gender: male  Primary Care Provider: Lujean Amel, MD Consultants: none Code Status: DNR  Pt Overview and Major Events to Date:  4/20: admitted with AMS and Hypernatremia  Assessment and Plan: Deandrea Baria is a 70 y.o. male presenting with hepatic encephalopathy. 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 Oriented to self and place. Could be hepatic, sepsis, drug or electrolyte issue. On admission, patient not responsive to questions or commands. Patient with TIPs. Ammonia 48.  He was hypothermic to 23F. CBC without leukocytosis. UA with few bacteria, large Hgb, small LE, negative nitrites. Lactic acid elevated to 3.4>>2.2.UDS significant for benzo (not on med list). Also hypernatremic to 168. -S/p 3 L of NS bolus -Continue Vanc/zosyn pending cultures -f/u cultures -continue lactulose and rifaximin -Will talk to wife -F/u on outside record.  Severe hypernatremia: Na+ 168>3L NS and D5W @100ml /hr>158. Free water deficit 7 L this morning. -Continue free water at 152ml/hr. Adjust rate for goal of 10 mEq/24hr - BMP q4h  Hypokalemia: 2.9> 10 mEq x4>3.5. Mg 1.7 -BMP as above  AKI / CKD: Cr 2.44>>>2.79(1.3-1.6 last hosp). Likely ATN from prerenal. History of urinary retention. -continue foley -continue fluid as above -BMP as bove  Snoring/?Sleep apnea: followed by outside pulmonology Dr. Corrie Dandy, was going to schedule outpatient sleep study - pulse ox - also restart home pulmicort nebs  Penile ulcer: negative RPR and GC/C, overall appears to be resolving.   SCC S/p esophagectomy - continue home PPI/protonix  FEN/GI: npo sips/meds until improved mental status  Prophylaxis: heparin  sq  Disposition: continue SDU   Subjective:  Patient sleep but opens eyes in response to his name. Denies pain by nodding his head. Oriented to self and place.   Objective: Temp:  [94 F (34.4 C)-97.3 F (36.3 C)] 96.3 F (35.7 C) (04/20 2340) Pulse Rate:  [66-82] 66 (04/20 2340) Resp:  [15-23] 21 (04/20 2340) BP: (89-145)/(50-76) 124/73 mmHg (04/20 2340) SpO2:  [95 %-100 %] 100 % (04/20 2340) Physical Exam: GEN: sleepy but arise, NAD Oropharynx: clear, moist CVS: RRR, normal s1 and s2, no murmurs, no edema RESP: no increased work of breathing, good air movement bilaterally, no crackles or wheeze, course upper airway sound. GI: soft, non-tender,non-distended, +BS MSK: appears wasted SKIN: some scabbed bruises on his shins NEURO: sleepy but arises when called, oriented to self and place.  Laboratory:  Recent Labs Lab 08/04/15 1042  WBC 5.4  HGB 11.4*  HCT 34.6*  PLT 104*    Recent Labs Lab 08/04/15 1042 08/04/15 1450 08/04/15 1759 08/04/15 2225  NA 168* 167* 163* 160*  K 3.5 2.9* 2.7* 3.0*  CL >130* >130* >130* 129*  CO2 22 22 19* 20*  BUN 19 19 19 20   CREATININE 2.08* 2.44* 2.11* 2.57*  CALCIUM 10.8* 9.9 9.0 9.3  PROT 7.4  --   --   --   BILITOT 1.8*  --   --   --   ALKPHOS 157*  --   --   --   ALT 30  --   --   --   AST 41  --   --   --   GLUCOSE 99 82 84 105*    Imaging/Diagnostic Tests: Dg Chest Port 1 View  08/04/2015  CLINICAL DATA:  Hypoxia EXAM: PORTABLE CHEST 1 VIEW COMPARISON:  07/20/2015 FINDINGS: There are low lung volumes. There is no evidence of no pneumonia or of residual interstitial edema. Mild medial lung base opacity is noted consistent with atelectasis. No convincing pleural effusion. No pneumothorax. Cardiac silhouette is normal in size. No mediastinal or hilar masses or convincing adenopathy. IMPRESSION: 1. Improved lung aeration since the prior study. No convincing pneumonia or edema. Electronically Signed   By: Lajean Manes M.D.    On: 08/04/2015 11:12    Mercy Riding, MD 08/05/2015, 12:33 AM PGY-1, [place] Intern pager: [phone], text pages welcome

## 2015-08-05 NOTE — NC FL2 (Signed)
[place] MEDICAID FL2 LEVEL OF CARE SCREENING TOOL     IDENTIFICATION  Patient Name: [name] Birthdate: 23-Nov-1945 Sex: male Admission Date (Current Location): 08/04/2015  [facility] and [place] Number:  Herbalist and Address:  [facility], [place] [address] [place], [place] [address]      Provider Number: [id number]  Attending Physician Name and Address:  Lupita Dawn, MD  Relative Name and Phone Number:  Porfirio, [place] [phone] or [phone]    Current Level of Care: Hospital Recommended Level of Care: [place] Prior Approval Number:    Date Approved/Denied:   PASRR Number: [id number]  Discharge Plan: SNF    Current Diagnoses: Patient Active Problem List   Diagnosis Date Noted  . Hypoxia   . Aspiration pneumonia of right middle lobe ([place])   . Sepsis ([place])   . Encephalopathy acute   . HCAP (healthcare-associated pneumonia)   . Encephalopathy   . Alcohol withdrawal ([place]) 07/12/2015  . Respiratory failure with hypoxia and hypercapnia ([place]) 07/05/2015  . Abdominal distention   . Alcohol abuse   . Cirrhosis ([place])   . Penile ulcer   . Acute respiratory failure with hypoxia and hypercapnia ([facility])   . Hepatic encephalopathy ([place]) 07/04/2015  . Acid indigestion 01/05/2015  . Cancer of esophagus ([place]) 01/05/2015  . Gout 01/05/2015  . S/P TIPS (transjugular intrahepatic portosystemic shunt)   . Ascites-treated 06/14/2014  . Abnormal magnetic resonance imaging study 05/16/2012  . Chronic cervical pain 05/16/2012  . Acute infection of nasal sinus 05/15/2012    Orientation RESPIRATION BLADDER Height & Weight     Self, Place  Normal Indwelling catheter Weight: 214 lb 1.1 oz (97.1 kg) Height:     BEHAVIORAL SYMPTOMS/MOOD NEUROLOGICAL BOWEL NUTRITION STATUS      Continent Diet (see DC summary)  AMBULATORY STATUS COMMUNICATION OF NEEDS Skin   Extensive Assist Verbally Surgical wounds                        Personal Care Assistance Level of Assistance  Bathing, Dressing, Feeding Bathing Assistance: Maximum assistance Feeding assistance: Limited assistance Dressing Assistance: Maximum assistance     Functional Limitations Info             SPECIAL CARE FACTORS FREQUENCY  PT (By licensed PT), OT (By licensed OT)     PT Frequency: 5/wk OT Frequency: 5/wk            Contractures      Additional Factors Info  Code Status, Allergies Code Status Info: DNR Allergies Info: Amoxicillin           Current Medications (08/05/2015):  This is the current hospital active medication list Current Facility-Administered Medications  Medication Dose Route Frequency Provider Last Rate Last Dose  . acetaminophen (TYLENOL) suppository 325 mg  325 mg Rectal Q6H PRN Mercy Riding, MD   325 mg at 08/05/15 0143  . budesonide (PULMICORT) nebulizer solution 0.5 mg  0.5 mg Nebulization BID Leone Brand, MD   0.5 mg at 08/05/15 [id number]  . dextrose 5 % 1,000 mL with potassium chloride 20 mEq infusion   Intravenous Continuous Mercy Riding, MD 100 mL/hr at 08/05/15 1200    . folic acid (FOLVITE) tablet 1 mg  1 mg Oral Daily Leone Brand, MD   1 mg at 08/04/15 1600  . heparin injection 5,000 Units  5,000 Units Subcutaneous Q8H Leone Brand, MD  5,000 Units at 08/05/15 0542  . lactulose (CHRONULAC) 10 GM/15ML solution 30 g  30 g Oral QID Leone Brand, MD   30 g at 08/04/15 1400  . lactulose (CHRONULAC) enema 200 gm  300 mL Rectal Once Vivi Barrack, MD      . pantoprazole (PROTONIX) EC tablet 40 mg  40 mg Oral Daily Leone Brand, MD   40 mg at 08/04/15 1600  . piperacillin-tazobactam (ZOSYN) IVPB 3.375 g  3.375 g Intravenous Q8H Kendra P Hiatt, RPH   3.375 g at 08/05/15 0538  . rifaximin (XIFAXAN) tablet 400 mg  400 mg Oral TID Leone Brand, MD   400 mg at 08/04/15 1600  . sodium chloride flush (NS) 0.9 % injection 3 mL  3 mL Intravenous Q12H Leone Brand, MD   3 mL at 08/04/15 2134   . thiamine (B-1) injection 100 mg  100 mg Intravenous Daily Vivi Barrack, MD   100 mg at 08/05/15 1013  . vancomycin (VANCOCIN) 1,250 mg in sodium chloride 0.9 % 250 mL IVPB  1,250 mg Intravenous Q24H Jaquita Folds, RPH         Discharge Medications: Please see discharge summary for a list of discharge medications.  Relevant Imaging Results:  Relevant Lab Results:   Additional Information SSN [id number]  Cranford Mon, [place]

## 2015-08-05 NOTE — Clinical Social Work Note (Signed)
Clinical Social Work Assessment  Patient Details  Name: [name] MRN: [id number] Date of Birth: Apr 20, 1945  Date of referral:  08/05/15               Reason for consult:  Facility Placement                Permission sought to share information with:  Family Supports, Chartered certified accountant granted to share information::  Yes, Verbal Permission Granted  Name::     Mikle Bosworth  Agency::  [facility]Fs  Relationship::  wife  Contact Information:     Housing/Transportation Living arrangements for the past 2 months:  [place], [place] of Information:  Spouse Patient Interpreter Needed:  None Criminal Activity/Legal Involvement Pertinent to Current Situation/Hospitalization:  No - Comment as needed Significant Relationships:  Spouse Lives with:  Spouse Do you feel safe going back to the place where you live?  No Need for family participation in patient care:  Yes (Comment) (decision making)  Care giving concerns:  Pt lives at home with wife who is unable to provide sufficient support at home- pt was just at SNF that was far away from home and wife had concerns about facility upkeep and environment.   Social Worker assessment / plan:  CSW spoke with pt wife concerning current hospitalization and plan for pt when medically stable to DC from hospital.  CSW met with wife at bedside- pt was unable to participate in conversation- was constantly moving limbs (leg over side of bed, arms help stiff in the air)- this was a big change from last admission when CSW had also met with pt and wife and pt was able to speak clearly and act appropriately.    Pt wife explained that she did not like current facility and would like placement closer to home- CSW explained that same barriers would likely still apply this admission (mainly insurance) but that we would attempt to find alternate facility for pt when ready for DC.  Employment status:   Retired Nurse, adult PT Recommendations:  Not assessed at this time Information / Referral to community resources:  [place]  Patient/Family's Response to care:  Pt wife is agreeable to looking into alternate options and acknowledge that pt would be unmanageable at home especially if current behaviors continue.  Patient/Family's Understanding of and Emotional Response to Diagnosis, Current Treatment, and Prognosis:  Pt wife has good understanding of pt condition and needs but is very concerned with decline in mental status and new signs of neurological damage (rigidity, babbling)  Emotional Assessment Appearance:  Appears stated age Attitude/Demeanor/Rapport:  Unable to Assess Affect (typically observed):  Unable to Assess Orientation:  Oriented to Self, Oriented to Place Alcohol / Substance use:  Not Applicable Psych involvement (Current and /or in the community):  No (Comment)  Discharge Needs  Concerns to be addressed:  Care Coordination, Discharge Planning Concerns Readmission within the last 30 days:  Yes Current discharge risk:  Physical Impairment Barriers to Discharge:  Continued Medical Work up, [employer], [place], LCSW 08/05/2015, 2:11 PM

## 2015-08-05 NOTE — Clinical Documentation Improvement (Addendum)
Family Medicine  Can the diagnosis of CKD be further specified by stage? Thank you   CKD Stage I - GFR greater than or equal to 90  CKD Stage II - GFR 60-89  CKD Stage III - GFR 30-59  CKD Stage IV - GFR 15-29  CKD Stage V - GFR < 15  Other condition  Unable to clinically determine   Supporting Information:  Hyponatremia, dehydration, aki   Please exercise your independent, professional judgment when responding. A specific answer is not anticipated or expected.   Thank You, [place] [phone]

## 2015-08-05 NOTE — Progress Notes (Signed)
CRITICAL VALUE ALERT  Critical value received:  Lactic acid 2.5  Date of notification:  08/04/15  Time of notification:  2240  Critical value read back: yes  Nurse who received alert:  Atilano Ina, RN  MD notified (1st page):  Cyndia Skeeters  Time of first page: [phone]  Responding MD:  Cyndia Skeeters  Time MD responded:  2255

## 2015-08-06 LAB — BASIC METABOLIC PANEL
ANION GAP: 9 (ref 5–15)
Anion gap: 12 (ref 5–15)
Anion gap: 10 (ref 5–15)
Anion gap: 11 (ref 5–15)
Anion gap: 11 (ref 5–15)
Anion gap: 11 (ref 5–15)
BUN: 16 mg/dL (ref 6–20)
BUN: 17 mg/dL (ref 6–20)
BUN: 16 mg/dL (ref 6–20)
BUN: 18 mg/dL (ref 6–20)
BUN: 18 mg/dL (ref 6–20)
BUN: 15 mg/dL (ref 6–20)
CALCIUM: 9 mg/dL (ref 8.9–10.3)
CALCIUM: 9.2 mg/dL (ref 8.9–10.3)
CALCIUM: 9.1 mg/dL (ref 8.9–10.3)
CALCIUM: 9.1 mg/dL (ref 8.9–10.3)
CHLORIDE: 120 mmol/L — AB (ref 101–111)
CHLORIDE: 119 mmol/L — AB (ref 101–111)
CO2: 23 mmol/L (ref 22–32)
CO2: 21 mmol/L — ABNORMAL LOW (ref 22–32)
CO2: 20 mmol/L — ABNORMAL LOW (ref 22–32)
CO2: 18 mmol/L — AB (ref 22–32)
CO2: 20 mmol/L — AB (ref 22–32)
CO2: 18 mmol/L — AB (ref 22–32)
CREATININE: 2.73 mg/dL — AB (ref 0.61–1.24)
CREATININE: 2.66 mg/dL — AB (ref 0.61–1.24)
CREATININE: 2.54 mg/dL — AB (ref 0.61–1.24)
CREATININE: 2.51 mg/dL — AB (ref 0.61–1.24)
Calcium: 9.6 mg/dL (ref 8.9–10.3)
Calcium: 9.5 mg/dL (ref 8.9–10.3)
Chloride: 122 mmol/L — ABNORMAL HIGH (ref 101–111)
Chloride: 121 mmol/L — ABNORMAL HIGH (ref 101–111)
Chloride: 118 mmol/L — ABNORMAL HIGH (ref 101–111)
Chloride: 120 mmol/L — ABNORMAL HIGH (ref 101–111)
Creatinine, Ser: 2.83 mg/dL — ABNORMAL HIGH (ref 0.61–1.24)
Creatinine, Ser: 2.82 mg/dL — ABNORMAL HIGH (ref 0.61–1.24)
GFR calc Af Amer: 25 mL/min — ABNORMAL LOW (ref >60)
GFR calc non Af Amer: 22 mL/min — ABNORMAL LOW (ref >60)
GFR calc non Af Amer: 22 mL/min — ABNORMAL LOW (ref >60)
GFR calc non Af Amer: 24 mL/min — ABNORMAL LOW (ref >60)
GFR calc non Af Amer: 24 mL/min — ABNORMAL LOW (ref >60)
GFR, EST AFRICAN AMERICAN: 26 mL/min — AB (ref >60)
GFR, EST AFRICAN AMERICAN: 27 mL/min — AB (ref >60)
GFR, EST AFRICAN AMERICAN: 24 mL/min — AB (ref >60)
GFR, EST AFRICAN AMERICAN: 24 mL/min — AB (ref >60)
GFR, EST AFRICAN AMERICAN: 28 mL/min — AB (ref >60)
GFR, EST NON AFRICAN AMERICAN: 21 mL/min — AB (ref >60)
GFR, EST NON AFRICAN AMERICAN: 21 mL/min — AB (ref >60)
GLUCOSE: 106 mg/dL — AB (ref 65–99)
Glucose, Bld: 100 mg/dL — ABNORMAL HIGH (ref 65–99)
Glucose, Bld: 91 mg/dL (ref 65–99)
Glucose, Bld: 247 mg/dL — ABNORMAL HIGH (ref 65–99)
Glucose, Bld: 90 mg/dL (ref 65–99)
Glucose, Bld: 109 mg/dL — ABNORMAL HIGH (ref 65–99)
POTASSIUM: 3.9 mmol/L (ref 3.5–5.1)
POTASSIUM: 4.4 mmol/L (ref 3.5–5.1)
POTASSIUM: 4.4 mmol/L (ref 3.5–5.1)
Potassium: 3.6 mmol/L (ref 3.5–5.1)
Potassium: 3.5 mmol/L (ref 3.5–5.1)
Potassium: 3.5 mmol/L (ref 3.5–5.1)
SODIUM: 151 mmol/L — AB (ref 135–145)
SODIUM: 154 mmol/L — AB (ref 135–145)
SODIUM: 154 mmol/L — AB (ref 135–145)
SODIUM: 148 mmol/L — AB (ref 135–145)
Sodium: 149 mmol/L — ABNORMAL HIGH (ref 135–145)
Sodium: 148 mmol/L — ABNORMAL HIGH (ref 135–145)

## 2015-08-06 LAB — BLOOD GAS, ARTERIAL
Acid-base deficit: 7.2 mmol/L — ABNORMAL HIGH (ref 0.0–2.0)
BICARBONATE: 17.2 meq/L — AB (ref 20.0–24.0)
Drawn by: 449561
FIO2: 0.21
O2 Saturation: 96.9 %
PATIENT TEMPERATURE: 98.6
PCO2 ART: 31.6 mmHg — AB (ref 35.0–45.0)
PH ART: 7.356 (ref 7.350–7.450)
PO2 ART: 97.3 mmHg (ref 80.0–100.0)
TCO2: 18.2 mmol/L (ref 0–100)

## 2015-08-06 LAB — GLUCOSE, CAPILLARY: Glucose-Capillary: 83 mg/dL (ref 65–99)

## 2015-08-06 NOTE — Progress Notes (Signed)
Family Medicine Teaching Service Daily Progress Note Intern Pager: [phone]  Patient name: [name] Medical record number: [id number] Date of birth: January 05, 1946 Age: 70 y.o. Gender: male  Primary Care Provider: Lujean Amel, MD Consultants: none Code Status: DNR  Pt Overview and Major Events to Date:  4/20: admitted with AMS and Hypernatremia  Assessment and Plan: [name] is a 70 y.o. male presenting with hepatic encephalopathy. 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 Likely multifactorial in setting of known hepatic encephalopathy and severe hypernatremia. Sepsis (hypothermic on admission) or drugs (benzos on UDS) also possibly playing a role.  -Correct hyernatremia (see below) -Continue Vanc/zosyn pending cultures -f/u cultures - no growth to date -continue lactulose and rifaximin -Consider GI or IR consult as mental status improves to discuss reversing TIPS  Severe hypernatremia: Improving. Goal reduction of 37mmol/dl per day.  - D5W with 20kcl @ 150cc/hr - BMP q4h - Adjust rate of D5W pending BMPs  Hypokalemia: Resolving  -Trend BMP - Replete as necessary   AKI / CKD: Cr 2.44>>>2.79(1.3-1.6 last hosp). Likely ATN. History of urinary retention. -continue foley -continue fluid as above -Trend BMP  Snoring/?Sleep apnea: followed by outside pulmonology Dr. Corrie Dandy, was going to schedule outpatient sleep study - pulse ox - also restart home pulmicort nebs  Penile ulcer: negative RPR and GC/C, overall appears to be resolving.   SCC S/p esophagectomy - continue home PPI/protonix  FEN/GI: npo sips/meds until improved mental status  Prophylaxis: heparin sq  Disposition: continue SDU    Subjective:  Alert this morning. Oriented to person and year. Still somewhat confused, asking [place] to take pick up things out of his hands.   Objective: Temp:   [96.8 F (36 C)-97.6 F (36.4 C)] 97.5 F (36.4 C) (04/22 0350) Pulse Rate:  [51-67] 51 (04/22 0700) Resp:  [14-20] 17 (04/22 0700) BP: (106-135)/(56-78) 132/72 mmHg (04/22 0700) SpO2:  [65 %-100 %] 99 % (04/22 0700) Weight:  [214 lb 1.1 oz (97.1 kg)] 214 lb 1.1 oz (97.1 kg) (04/21 1100) Physical Exam: GEN: Eyes open, alert. NAD Oropharynx: clear, moist CVS: RRR, normal s1 and s2, no murmurs, no edema RESP: no increased work of breathing, good air movement bilaterally, no crackles or wheeze, course upper airway sound. GI: soft, non-tender,non-distended, +BS NEURO: Alert, still somewhat confused. Moves all extremities. No focal deficits.   Laboratory:  Recent Labs Lab 08/04/15 1042 08/05/15 0529  WBC 5.4 5.6  HGB 11.4* 10.3*  HCT 34.6* 31.2*  PLT 104* 102*    Recent Labs Lab 08/04/15 1042  08/05/15 2026 08/06/15 0046 08/06/15 0518  NA 168*  < > 157* 154* 154*  K 3.5  < > 3.8 4.4 3.9  CL >130*  < > 123* 122* 121*  CO2 22  < > 22 23 21*  BUN 19  < > 17 18 18   CREATININE 2.08*  < > 2.95* 2.83* 2.82*  CALCIUM 10.8*  < > 9.8 9.6 9.5  PROT 7.4  --   --   --   --   BILITOT 1.8*  --   --   --   --   ALKPHOS 157*  --   --   --   --   ALT 30  --   --   --   --   AST 41  --   --   --   --   GLUCOSE 99  < >  104* 100* 91  < > = values in this interval not displayed.  Imaging/Diagnostic Tests: None new.   Vivi Barrack, MD 08/06/2015, 7:56 AM PGY-2, [facility] Intern pager: [phone], text pages welcome

## 2015-08-06 NOTE — Evaluation (Signed)
Clinical/Bedside Swallow Evaluation Patient Details  Name: [name] MRN: [id number] Date of Birth: 1945/12/01  Today's Date: 08/06/2015 Time: SLP Start Time (ACUTE ONLY): 0825 SLP Stop Time (ACUTE ONLY): 0848 SLP Time Calculation (min) (ACUTE ONLY): 23 min  Past Medical History:  Past Medical History  Diagnosis Date  . Hypertension     under control  . History of cancer chemotherapy   . Hx of radiation therapy 2002  . Gout     under control  . Umbilical hernia   . Fatty liver     "under control, being monitored"  . Shortness of breath dyspnea   . GERD (gastroesophageal reflux disease)   . Alcoholic cirrhosis ([place])   . Esophageal cancer ([place]) 2002    "partial" esophagus removed  . Sleep apnea     "will not get tested" (08/04/2015)  . Arthritis     "neck" (08/04/2015)   Past Surgical History:  Past Surgical History  Procedure Laterality Date  . Esophagectomy  2002    "partial"  . Incisional hernia repair  2004  . Umbilical hernia repair N/A 06/14/2014    Procedure:  OPEN UMBILICAL HERNIA REPAIR;  Surgeon: Pedro Earls, MD;  Location: [facility];  Service: General;  Laterality: N/A;  . Tonsillectomy    . Radiology with anesthesia N/A 02/09/2015    Procedure: TIPS;  Surgeon: Jacqulynn Cadet, MD;  Location: [facility];  Service: Radiology;  Laterality: N/A;  . Hernia repair     HPI:  70 y.o. male presenting with hepatic encephalopathy. 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  Assessment / Plan / Recommendation Clinical Impression   Pt with overt s/s of aspiration including delayed cough with all consistencies assessed, delayed initiation of the swallow suspected, oral holding,  decreased oral manipulation/propulsion and awareness of bolus; pt was lethargic and had difficulty following directives to complete OME and entire BSE; he also exhibited  audible snoring during BSE; recommend NPO at this time d/t severe risk of aspiration with meds crushed in puree if pt is alert (nursing discretion) or via alternative means; ST to f/u for improvement in po intake and recommendation of safest diet advancement    Aspiration Risk  Severe aspiration risk    Diet Recommendation   NPO   Medication Administration: Other (Comment) (crushed with puree; small bites if alert)    Other  Recommendations Oral Care Recommendations: Oral care QID   Follow up Recommendations   TBD    Frequency and Duration min 2x/week  1 week       Prognosis Prognosis for Safe Diet Advancement: Good Barriers to Reach Goals: Cognitive deficits      Swallow Study   General Date of Onset: 08/04/15 HPI: 70 y.o. male presenting with hepatic encephalopathy. 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, hx esophageal SCC s/p esophagectomy, HTN, gout Type of Study: Bedside Swallow Evaluation Diet Prior to this Study: NPO Temperature Spikes Noted: No Respiratory Status: Room air History of Recent Intubation: Yes Length of Intubations (days): 6 days (3/21-3/27/17) Date extubated: 07/11/15 Behavior/Cognition: Confused;Lethargic/Drowsy Oral Cavity Assessment: Other (comment) (limited; dry) Oral Care Completed by SLP: Recent completion by staff Oral Cavity - Dentition: Adequate natural dentition Self-Feeding Abilities: Needs  assist;Needs set up Patient Positioning: Upright in bed Baseline Vocal Quality: Hoarse;Low vocal intensity Volitional Cough: Cognitively unable to elicit Volitional Swallow: Unable to elicit    Oral/Motor/Sensory Function Overall Oral Motor/Sensory Function: Other (comment) (difficult to assess d/t inability to follow instructions)   Ice Chips Ice  chips: Impaired Presentation: Spoon Oral Phase Impairments: Reduced lingual movement/coordination;Poor awareness of bolus Oral Phase Functional Implications: Prolonged oral transit;Oral holding Pharyngeal Phase Impairments: Suspected delayed Swallow   Thin Liquid Thin Liquid: Impaired Presentation: Spoon Oral Phase Impairments: Reduced labial seal;Reduced lingual movement/coordination;Poor awareness of bolus Oral Phase Functional Implications: Prolonged oral transit;Oral holding Pharyngeal  Phase Impairments: Suspected delayed Swallow;Cough - Delayed    Nectar Thick Nectar Thick Liquid: Not tested   Honey Thick Honey Thick Liquid: Not tested   Puree Puree: Impaired Presentation: Spoon Oral Phase Impairments: Reduced lingual movement/coordination;Poor awareness of bolus Oral Phase Functional Implications: Prolonged oral transit;Oral residue Pharyngeal Phase Impairments: Suspected delayed Swallow;Throat Clearing - Delayed;Cough - Delayed   Solid      Solid: Not tested        Dimetrius Montfort,PAT, M.S., CCC-SLP 08/06/2015,9:40 AM

## 2015-08-06 NOTE — Evaluation (Signed)
Physical Therapy Evaluation Patient Details Name: [name] MRN: [id number] DOB: 24-Jan-1946 Today's Date: 08/06/2015   History of Present Illness  [name] is a 70 y.o. male presenting with worsening hepatic encephalopathy. 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 He required intubation from 3/21-3/27 thought to be due to severe sleep apnea.     Clinical Impression  Pt admitted with above diagnosis. Pt currently with functional limitations due to the deficits listed below (see PT Problem List). Evaluation limited by patient's confusion. Anticipate as this improves that he will be able to participate with PT as he has done previously. Pt will benefit from skilled PT to increase their independence and safety with mobility to allow discharge to the venue listed below.       Follow Up Recommendations SNF    Equipment Recommendations  None recommended by PT    Recommendations for Other Services OT consult     Precautions / Restrictions Precautions Precautions: Fall Restrictions Weight Bearing Restrictions: No      Mobility  Bed Mobility Overal bed mobility: Needs Assistance;+2 for physical assistance Bed Mobility: Rolling Rolling: Total assist         General bed mobility comments: very difficult to get pt to follow commands for rolling and ultimately once he grabbed rail he resisted/pushed against rail; attempted bil  Transfers                    Ambulation/Gait                Stairs            Wheelchair Mobility    Modified Rankin (Stroke Patients Only)       Balance                                             Pertinent Vitals/Pain Pain Assessment: Faces Faces Pain Scale: Hurts even more Pain Location: unclear; winced with elevating HOB Pain Descriptors / Indicators:  Grimacing Pain Intervention(s): Limited activity within patient's tolerance    Home Living Family/patient expects to be discharged to:: Skilled nursing facility Living Arrangements: Spouse/significant other               Additional Comments: wife/son present; state he only had 3 PT sessions at current SNF and agree he will need SNF on d/c    Prior Function Level of Independence: Needs assistance   Gait / Transfers Assistance Needed: recently working on standing with RW at SNF           Hand Dominance        Extremity/Trunk Assessment   Upper Extremity Assessment: Generalized weakness;Difficult to assess due to impaired cognition (able to push/pull with visual and verbal cues)           Lower Extremity Assessment: RLE deficits/detail;LLE deficits/detail;Difficult to assess due to impaired cognition RLE Deficits / Details: AROM WFL, combined hip/knee extension in supine 4/5 LLE Deficits / Details: AROM WFL, combined hip/knee extension in supine 4/5  Cervical / Trunk Assessment: Kyphotic  Communication   Communication: HOH;Expressive difficulties (speech is "thick" and garbled)  Cognition Arousal/Alertness: Lethargic Behavior During Therapy: Flat affect Overall Cognitive Status: Impaired/Different from baseline Area of Impairment: Orientation;Attention;Following commands Orientation Level: Place;Time;Situation Current Attention Level: Sustained (up to  10-15 sec at most)   Following Commands: Follows one step commands inconsistently            General Comments General comments (skin integrity, edema, etc.): Wife and son present and provided info    Exercises        Assessment/Plan    PT Assessment Patient needs continued PT services  PT Diagnosis Generalized weakness;Altered mental status   PT Problem List Decreased strength;Decreased activity tolerance;Decreased balance;Decreased mobility;Decreased cognition;Decreased knowledge of use of  DME;Decreased safety awareness;Obesity  PT Treatment Interventions DME instruction;Gait training;Stair training;Functional mobility training;Therapeutic activities;Therapeutic exercise;Balance training;Cognitive remediation;Patient/family education   PT Goals (Current goals can be found in the Care Plan section) Acute Rehab PT Goals Patient Stated Goal: patient to be medically ready for d/c to SNF PT Goal Formulation: With family Time For Goal Achievement: 08/20/15 Potential to Achieve Goals: Fair    Frequency Min 2X/week   Barriers to discharge        Co-evaluation               End of Session   Activity Tolerance: Patient limited by lethargy;Treatment limited secondary to medical complications (Comment) (encephalopathy) Patient left: in bed;with family/visitor present;with bed alarm set (family requesting 4 rails up and notified RN) Nurse Communication: Mobility status;Other (comment) (cognition)         Time: [id number] PT Time Calculation (min) (ACUTE ONLY): 20 min   Charges:   PT Evaluation $PT Eval Moderate Complexity: 1 Procedure     PT G Codes:        [name] 2015-08-30, 11:08 AM Pager [phone]

## 2015-08-07 ENCOUNTER — Inpatient Hospital Stay (HOSPITAL_COMMUNITY): Payer: Medicare HMO

## 2015-08-07 DIAGNOSIS — R319 Hematuria, unspecified: Secondary | ICD-10-CM | POA: Insufficient documentation

## 2015-08-07 DIAGNOSIS — I959 Hypotension, unspecified: Secondary | ICD-10-CM

## 2015-08-07 LAB — TROPONIN I: TROPONIN I: 0.03 ng/mL (ref <0.031)

## 2015-08-07 LAB — BASIC METABOLIC PANEL
Anion gap: 8 (ref 5–15)
Anion gap: 10 (ref 5–15)
BUN: 14 mg/dL (ref 6–20)
BUN: 13 mg/dL (ref 6–20)
CALCIUM: 8.2 mg/dL — AB (ref 8.9–10.3)
CHLORIDE: 117 mmol/L — AB (ref 101–111)
CO2: 19 mmol/L — ABNORMAL LOW (ref 22–32)
CO2: 16 mmol/L — ABNORMAL LOW (ref 22–32)
CREATININE: 2.41 mg/dL — AB (ref 0.61–1.24)
CREATININE: 2.28 mg/dL — AB (ref 0.61–1.24)
Calcium: 8.1 mg/dL — ABNORMAL LOW (ref 8.9–10.3)
Chloride: 116 mmol/L — ABNORMAL HIGH (ref 101–111)
GFR calc Af Amer: 29 mL/min — ABNORMAL LOW (ref >60)
GFR calc Af Amer: 31 mL/min — ABNORMAL LOW (ref >60)
GFR, EST NON AFRICAN AMERICAN: 25 mL/min — AB (ref >60)
GFR, EST NON AFRICAN AMERICAN: 27 mL/min — AB (ref >60)
Glucose, Bld: 90 mg/dL (ref 65–99)
Glucose, Bld: 87 mg/dL (ref 65–99)
Potassium: 3.7 mmol/L (ref 3.5–5.1)
Potassium: 4 mmol/L (ref 3.5–5.1)
SODIUM: 142 mmol/L (ref 135–145)
Sodium: 144 mmol/L (ref 135–145)

## 2015-08-07 LAB — URINE MICROSCOPIC-ADD ON

## 2015-08-07 LAB — URINALYSIS, ROUTINE W REFLEX MICROSCOPIC
BILIRUBIN URINE: NEGATIVE
Glucose, UA: NEGATIVE mg/dL
KETONES UR: NEGATIVE mg/dL
Leukocytes, UA: NEGATIVE
NITRITE: NEGATIVE
PROTEIN: NEGATIVE mg/dL
Specific Gravity, Urine: 1.015 (ref 1.005–1.030)
pH: 5 (ref 5.0–8.0)

## 2015-08-07 MED ORDER — MIDODRINE HCL 5 MG PO TABS
5.0000 mg | ORAL_TABLET | Freq: Three times a day (TID) | ORAL | Status: DC
  Administered 2015-08-07 – 2015-08-10 (×10): 5 mg via ORAL
  Filled 2015-08-07 (×10): qty 1

## 2015-08-07 MED ORDER — RESOURCE THICKENUP CLEAR PO POWD
ORAL | Status: DC | PRN
  Administered 2015-08-07: 16:00:00 via ORAL
  Filled 2015-08-07: qty 125

## 2015-08-07 MED ORDER — KCL IN DEXTROSE-NACL 20-5-0.45 MEQ/L-%-% IV SOLN
INTRAVENOUS | Status: DC
  Administered 2015-08-07 – 2015-08-09 (×5): via INTRAVENOUS
  Filled 2015-08-07 (×7): qty 1000

## 2015-08-07 MED ORDER — SODIUM CHLORIDE 0.9 % IV BOLUS (SEPSIS)
1000.0000 mL | Freq: Once | INTRAVENOUS | Status: AC
  Administered 2015-08-07: 1000 mL via INTRAVENOUS

## 2015-08-07 NOTE — Progress Notes (Signed)
Pt is hypotensive. FMTS. Paged.

## 2015-08-07 NOTE — Progress Notes (Signed)
Patient was being placed on Bipap by RT department at this time

## 2015-08-07 NOTE — Progress Notes (Signed)
Pharmacy Antibiotic Note  [name] is a 70 y.o. male admitted on 08/04/2015 with sepsis.  Currently on D#3 of Zosyn and Vancomycin pending culture results.  WBC wnl, afebrile. SCr remains stable at 2.54. Cultures negative to date   Plan:  1-  Vancomycin 1250mg  IV x 1, then 750mg  IV q12 2-  Zosyn 3.375g IV q8, infusing over 4hr 3-  F/U lab results and adjust dosing as needed 4-  Trend clinical status, f/u cx 5-  Vanc trough at steady state  Weight: 214 lb 1.1 oz (97.1 kg)  Temp (24hrs), Avg:96.8 F (36 C), Min:95.2 F (35.1 C), Max:98.3 F (36.8 C)   Recent Labs Lab 08/04/15 1042 08/04/15 1450  08/04/15 2243 08/05/15 0133 08/05/15 0529  08/06/15 0721 08/06/15 1214 08/06/15 1806 08/06/15 1855 08/07/15 0510  WBC 5.4  --   --   --   --  5.6  --   --   --   --   --   --   CREATININE 2.08* 2.44*  < >  --  2.68* 2.56*  < > 2.73* 2.66* 2.54* 2.51* 2.41*  LATICACIDVEN 3.4* 3.9*  --  2.5* 2.2*  --   --   --   --   --   --   --   < > = values in this interval not displayed.  Estimated Creatinine Clearance: 32.2 mL/min (by C-G formula based on Cr of 2.41).    Allergies  Allergen Reactions  . Amoxicillin Diarrhea    Antimicrobials this admission: Vanc 4/20 >>  Zosyn 4/20 >>    Microbiology results: 4/20 BCx: NGTD 4/20 UCx:  NGTD    Thank you for allowing pharmacy to be a part of this patient's care.  Albertina Parr, PharmD., BCPS Clinical Pharmacist Pager [phone]

## 2015-08-07 NOTE — Progress Notes (Signed)
Family Medicine Teaching Service Daily Progress Note Intern Pager: [phone]  Patient name: [name] Medical record number: [id number] Date of birth: 29-Oct-1945 Age: 70 y.o. Gender: male  Primary Care Provider: Lujean Amel, MD Consultants: none Code Status: DNR  Pt Overview and Major Events to Date:  4/20: admitted with AMS and Hypernatremia  Assessment and Plan: [name] is a 70 y.o. male presenting with hepatic encephalopathy. 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 Likely multifactorial in setting of known hepatic encephalopathy and severe hypernatremia. Sepsis (hypothermic on admission) or drugs (benzos on UDS) also possibly playing a role.  -Continue Vanc/zosyn pending cultures -f/u cultures - no growth to date -continue lactulose and rifaximin -Consider GI or IR consult as mental status improves to discuss reversing TIPS -SLP eval, may consider enteral feedings, though would be cautious with NG tube placement given history of cirrhosis and risk of varices   Severe hypernatremia: Resolved. Na 168 on admission, initial free water deficit of 9.7L. - Continue mIVF until taking PO - BMP daily  Acute on Chronic Kidney Disease / Oliguria: Cr 2.79 on admission(1.3-1.6 last hosp). Likely ATN. History of urinary retention. -Strict I/O -Consider renal imaging if urine output does not improve.  -Daily BMP  Hypotension: On midodrine 5mg  tid during last hospital stay due to persistent hypotension. Likely secondary to cirrhosis.  - Restart midodrine 5mg  tid  Snoring/?Sleep apnea: followed by outside pulmonology Dr. Corrie Dandy, was going to schedule outpatient sleep study - pulse ox - also restart home pulmicort nebs - BiPAP at night  Penile ulcer: negative RPR and GC/C, overall appears to be resolving.   SCC S/p esophagectomy - continue home  PPI/protonix  FEN/GI: npo sips/meds until improved mental status  Prophylaxis: heparin sq  Disposition: continue SDU    Subjective:  Patient became hypotensive overnight requiring 2L of NS. No complaints this morning. Oriented to person, place, and time.   Objective: Temp:  [95.2 F (35.1 C)-98.3 F (36.8 C)] 98.3 F (36.8 C) (04/23 0500) Pulse Rate:  [39-71] 69 (04/23 0500) Resp:  [13-28] 17 (04/23 0500) BP: (69-136)/(39-83) 86/48 mmHg (04/23 0500) SpO2:  [97 %-100 %] 100 % (04/23 0500) Physical Exam: GEN: Eyes open, alert. NAD Oropharynx: Tongue appears swollen and inflammed CVS: RRR, normal s1 and s2, no murmurs, no edema RESP: no increased work of breathing, good air movement bilaterally, no crackles or wheeze, course upper airway sound. GI: soft, non-tender,non-distended, +BS NEURO: Alert and oriented x3, though still somewhat confused. Moves all extremities. No focal deficits.   Laboratory:  Recent Labs Lab 08/04/15 1042 08/05/15 0529  WBC 5.4 5.6  HGB 11.4* 10.3*  HCT 34.6* 31.2*  PLT 104* 102*    Recent Labs Lab 08/04/15 1042  08/06/15 1806 08/06/15 1855 08/07/15 0510  NA 168*  < > 149* 148* 144  K 3.5  < > 3.5 3.5 3.7  CL >130*  < > 120* 119* 117*  CO2 22  < > 18* 18* 19*  BUN 19  < > 16 16 14   CREATININE 2.08*  < > 2.54* 2.51* 2.41*  CALCIUM 10.8*  < > 9.1 9.1 8.2*  PROT 7.4  --   --   --   --   BILITOT 1.8*  --   --   --   --   ALKPHOS 157*  --   --   --   --   ALT 30  --   --   --   --  AST 41  --   --   --   --   GLUCOSE 99  < > 109* 106* 90  < > = values in this interval not displayed.  Imaging/Diagnostic Tests: None new.   Tony Barrack, MD 08/07/2015, 6:40 AM PGY-2, [facility] Intern pager: [phone], text pages welcome

## 2015-08-07 NOTE — Evaluation (Signed)
Clinical/Bedside Swallow Evaluation Patient Details  Name: Lynne Vallas MRN: [id number] Date of Birth: March 06, 1946  Today's Date: 08/07/2015 Time: SLP Start Time (ACUTE ONLY): 66 SLP Stop Time (ACUTE ONLY): 1125 SLP Time Calculation (min) (ACUTE ONLY): 20 min  Past Medical History:  Past Medical History  Diagnosis Date  . Hypertension     under control  . History of cancer chemotherapy   . Hx of radiation therapy 2002  . Gout     under control  . Umbilical hernia   . Fatty liver     "under control, being monitored"  . Shortness of breath dyspnea   . GERD (gastroesophageal reflux disease)   . Alcoholic cirrhosis ([place])   . Esophageal cancer ([place]) 2002    "partial" esophagus removed  . Sleep apnea     "will not get tested" (08/04/2015)  . Arthritis     "neck" (08/04/2015)   Past Surgical History:  Past Surgical History  Procedure Laterality Date  . Esophagectomy  2002    "partial"  . Incisional hernia repair  2004  . Umbilical hernia repair N/A 06/14/2014    Procedure:  OPEN UMBILICAL HERNIA REPAIR;  Surgeon: Pedro Earls, MD;  Location: [facility];  Service: General;  Laterality: N/A;  . Tonsillectomy    . Radiology with anesthesia N/A 02/09/2015    Procedure: TIPS;  Surgeon: Jacqulynn Cadet, MD;  Location: [facility];  Service: Radiology;  Laterality: N/A;  . Hernia repair     HPI:  Pt is a 70 y.o. male presented to ED 4/20 presenting with hepatic encephalopathy. 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 Pt was seen yesterdasy 4/22 for a bedside swallow eval with recommendations for pt to be NPO given lethargy/ decreased awareness of bolus. Pt reportedly improving today 4/23, SLP eval re-ordered as there is consideration now of enteral feedings or NG.   Assessment / Plan / Recommendation Clinical Impression  Pt showed delayed s/s of aspiration at bedside following trials of thin liquid and  puree consistencies (delayed cough, wet vocal quality). Upon entering room, RN was attempting to give pill whole in puree, which pt was unable to do. Reattempted with water, and pt swallowed with ease. Compared to report from yesterday, pt more alert/ able to follow directions with minimal cues. Wife at bedside today reported that since recent admission last month (had prolonged intubation), pt has been "making noises" during meals and has had a persistent cough. Given these findings and current altered mental status (however improving), recommend that pt continue to be NPO with the exception of meds crushed in puree or whole with small sips of liquid, further recommendations pending MBS to objectively evaluate swallow function. MBS to be completed later today.    Aspiration Risk  Severe aspiration risk;Risk for inadequate nutrition/hydration    Diet Recommendation NPO   Medication Administration: Other (Comment) (crushed in puree, whole with liquid if unable to crush)    Other  Recommendations Oral Care Recommendations: Oral care QID Other Recommendations: Have oral suction available   Follow up Recommendations   (TBD)    Frequency and Duration  (TBD)          Prognosis Prognosis for Safe Diet Advancement: Good Barriers to Reach Goals: Cognitive deficits      Swallow Study   General HPI: Pt is a 70 y.o. male presented to ED 4/20 presenting with hepatic encephalopathy. PMH is significant for EtOH cirrhosis s/p  TIPS 01/2015 with refractory ascites, hx esophageal SCC s/p esophagectomy, HTN, gout. Pt was seen yesterdasy 4/22 for a bedside swallow eval with recommendations for pt to be NPO given lethargy/ decreased awareness of bolus. Pt reportedly improving today 4/23, SLP eval re-ordered as there is consideration now of enteral feedings or NG. Type of Study: Bedside Swallow Evaluation Previous Swallow Assessment: 4/22- recommendation of NPO Diet Prior to this Study: NPO Temperature Spikes  Noted: No Respiratory Status: Room air History of Recent Intubation: Yes Length of Intubations (days): 6 days Date extubated: 07/11/15 Behavior/Cognition: Alert;Cooperative;Requires cueing;Distractible Oral Cavity Assessment: Within Functional Limits Oral Care Completed by SLP: Recent completion by staff Oral Cavity - Dentition: Adequate natural dentition Vision: Functional for self-feeding Self-Feeding Abilities: Needs assist Patient Positioning: Upright in bed Baseline Vocal Quality: Wet Volitional Cough: Strong Volitional Swallow: Unable to elicit    Oral/Motor/Sensory Function Overall Oral Motor/Sensory Function: Other (comment) (difficult to assess)   Ice Chips Ice chips: Not tested   Thin Liquid Thin Liquid: Impaired Presentation: Cup Pharyngeal  Phase Impairments: Suspected delayed Swallow;Wet Vocal Quality    Nectar Thick Nectar Thick Liquid: Not tested   Honey Thick Honey Thick Liquid: Not tested   Puree Puree: Impaired Presentation: Spoon Pharyngeal Phase Impairments: Suspected delayed Swallow;Cough - Delayed   Solid   GO   Solid: Not tested        Kern Reap, MA, CCC-SLP 08/07/2015,11:37 AM [phone]

## 2015-08-07 NOTE — Progress Notes (Signed)
Paged Family Medicine pager.  pts BP 67/38.  New orders received.

## 2015-08-07 NOTE — Progress Notes (Signed)
Patient was bradycardic on the telemetry monitor reading 40-50's.  Patient was unresponsive to pain and lethargic at this time.  Patient's MD on-call was paged after 12 lead EKG was performed and asked to see the patient on the floor.  Patient's was being looked after by myself at the time primary RN notified.

## 2015-08-07 NOTE — Progress Notes (Addendum)
MBSS complete. Full report located under chart review in imaging section.  Recommendations: Dysphagia 1, honey-thick liquids, meds crushed in puree or via alternative means if cannot crush; full supervision: cue pt to swallow 2 times, small bites/ sips, minimize distractions, ensure pt seated upright  Blair Heys, [place], [place]

## 2015-08-08 LAB — BASIC METABOLIC PANEL
Anion gap: 10 (ref 5–15)
Anion gap: 7 (ref 5–15)
BUN: 11 mg/dL (ref 6–20)
BUN: 11 mg/dL (ref 6–20)
CALCIUM: 8.2 mg/dL — AB (ref 8.9–10.3)
CALCIUM: 8.5 mg/dL — AB (ref 8.9–10.3)
CO2: 17 mmol/L — ABNORMAL LOW (ref 22–32)
CO2: 18 mmol/L — AB (ref 22–32)
CREATININE: 2.12 mg/dL — AB (ref 0.61–1.24)
Chloride: 115 mmol/L — ABNORMAL HIGH (ref 101–111)
Chloride: 115 mmol/L — ABNORMAL HIGH (ref 101–111)
Creatinine, Ser: 2 mg/dL — ABNORMAL HIGH (ref 0.61–1.24)
GFR calc Af Amer: 34 mL/min — ABNORMAL LOW (ref >60)
GFR calc Af Amer: 37 mL/min — ABNORMAL LOW (ref >60)
GFR, EST NON AFRICAN AMERICAN: 29 mL/min — AB (ref >60)
GFR, EST NON AFRICAN AMERICAN: 32 mL/min — AB (ref >60)
GLUCOSE: 87 mg/dL (ref 65–99)
GLUCOSE: 82 mg/dL (ref 65–99)
Potassium: 3.9 mmol/L (ref 3.5–5.1)
Potassium: 3.7 mmol/L (ref 3.5–5.1)
Sodium: 142 mmol/L (ref 135–145)
Sodium: 140 mmol/L (ref 135–145)

## 2015-08-08 NOTE — Care Management Important Message (Signed)
Important Message  Patient Details  Name: [name] MRN: [id number] Date of Birth: 08/15/1945   Medicare Important Message Given:  Yes    Nathen May 08/08/2015, 12:16 PM

## 2015-08-08 NOTE — Progress Notes (Signed)
Speech Language Pathology Treatment: Dysphagia  Patient Details Name: [name] MRN: [id number] DOB: July 20, 1945 Today's Date: 08/08/2015 Time: [id number] SLP Time Calculation (min) (ACUTE ONLY): 14 min  Assessment / Plan / Recommendation Clinical Impression  F/u after yesterday's MBS.  Pt with improved voice per wife, but with persisting confusion and dysarthria.  Consumed honey-thick liquids with wet voice at baseline, unchanged after consumption, but no overt cough.  Min-mod cues required for f/u swallows.  Reviewed results of MBS and rationale for diet modifications; pt/wife will need reinforcement of education. SLP will continue to follow for toleration and progression of diet.    HPI HPI: Pt is a 70 y.o. male presented to ED 4/20 presenting with hepatic encephalopathy. 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 Pt was seen yesterdasy 4/22 for a bedside swallow eval with recommendations for pt to be NPO given lethargy/ decreased awareness of bolus. Pt reportedly improving today 4/23, SLP eval re-ordered as there is consideration now of enteral feedings or NG.      SLP Plan        Recommendations  Diet recommendations: Dysphagia 1 (puree);Honey-thick liquid Liquids provided via: Cup Medication Administration: Crushed with puree Supervision: Full supervision/cueing for compensatory strategies Compensations: Minimize environmental distractions;Slow rate;Small sips/bites;Multiple dry swallows after each bite/sip Postural Changes and/or Swallow Maneuvers: Seated upright 90 degrees;Upright 30-60 min after meal             Oral Care Recommendations: Oral care BID     [name], [place] CCC/SLP Pager [phone]                [name] 08/08/2015, 4:17 PM

## 2015-08-08 NOTE — Progress Notes (Signed)
Pt transferred without bipap.  Pt sats are good on RA and conversation with pt seems a little confused.  RT will speak with RN about need for cpa/bipap.

## 2015-08-08 NOTE — Progress Notes (Signed)
Family Medicine Teaching Service Daily Progress Note Intern Pager: [phone]  Patient name: [name] Medical record number: [id number] Date of birth: [birth year] Age: 70 y.o. Gender: male  Primary Care Provider: Lujean Amel, MD Consultants: none Code Status: DNR  Pt Overview and Major Events to Date:  4/20: admitted with AMS and Hypernatremia  Assessment and Plan: [name] is a 70 y.o. male presenting with AMS likley from hypernatremia vs hepatic encephalopathy. 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 Likely 2/2 to severe hypernatremia vs. hepatic encephalopathy. Sepsis (hypothermic on admission) but blood and urine cultures NGTD. Drugs (benzos on UDS) also possibly playing a role. Patient AAO x4 except dates. -Vanc/zosyn 4/20-4/23 -continue lactulose and rifaximin -Consider GI or IR consult as mental status improves to discuss reversing TIPS -SLP recs: moderate to severe dysphagia risk. Recommended puree at slow rate. -PT/OT eval  Severe hypernatremia: resolved. Na 168 on admission, initial free water deficit of 9.7L. - Continue D5-1/2NS+20KCL@100  until taking PO well - BMP daily  Acute on CKD-3 / Oliguria: Cr 2. Cr 2.79 on admission(1.3-1.6 last hosp).  -Strict I/O: UOP 1.3L -Daily BMP -D/c foley today. Voiding trial  Hypotension: normotensive this morning. On midodrine 5mg  tid during last hospital stay due to persistent hypotension. Likely secondary to cirrhosis.  - Restarted midodrine 5mg  three times a day  Snoring/?Sleep apnea: followed by outside pulmonology Dr. Corrie Dandy, was going to schedule outpatient sleep study - pulse ox - also restart home pulmicort nebs - BiPAP at night  Penile ulcer: negative RPR and GC/C, overall appears to be resolving.   SCC S/p esophagectomy - continue home PPI/protonix  FEN/GI:  Moderate to severe dysphagia risk.  Recommended puree at slow rate. IVF as above  Prophylaxis: heparin sq  Disposition: transfer to floor.  Subjective:  No events overnight. Denies chest pain or shortness of breath. Alert and oriented today. Reports eating some apple sauce this morning.   Objective: Temp:  [96.4 F (35.8 C)-97.4 F (36.3 C)] 97.4 F (36.3 C) (04/24 0554) Pulse Rate:  [51-180] 64 (04/24 0343) Resp:  [12-21] 14 (04/24 0343) BP: (67-115)/(38-72) 115/68 mmHg (04/24 0343) SpO2:  [93 %-100 %] 100 % (04/24 0343) Weight:  [209 lb 10.5 oz (95.1 kg)] 209 lb 10.5 oz (95.1 kg) (04/24 0600) Physical Exam: GEN: alert and awake, NAD Oropharynx: Tongue appears bigger but no sign of lesion and inflammation CVS: RRR, normal s1 and s2, no murmurs, no edema RESP: no increased work of breathing, good air movement bilaterally, no crackles or wheeze, course upper airway sound. GI: soft, non-tender,non-distended, +BS SKIN: some skin bruises on his left arm and shins likely from scratching himself.  NEURO: AAO x4 except date  Laboratory:  Recent Labs Lab 08/04/15 1042 08/05/15 0529  WBC 5.4 5.6  HGB 11.4* 10.3*  HCT 34.6* 31.2*  PLT 104* 102*    Recent Labs Lab 08/04/15 1042  08/07/15 0510 08/07/15 1647 08/08/15 0325  NA 168*  < > 144 142 142  K 3.5  < > 3.7 4.0 3.9  CL >130*  < > 117* 116* 115*  CO2 22  < > 19* 16* 17*  BUN 19  < > 14 13 11   CREATININE 2.08*  < > 2.41* 2.28* 2.12*  CALCIUM 10.8*  < > 8.2* 8.1* 8.2*  PROT 7.4  --   --   --   --   BILITOT 1.8*  --   --   --   --  ALKPHOS 157*  --   --   --   --   ALT 30  --   --   --   --   AST 41  --   --   --   --   GLUCOSE 99  < > 90 87 87  < > = values in this interval not displayed.  Imaging/Diagnostic Tests: None new.   Mercy Riding, MD 08/08/2015, 6:52 AM PGY-1, [place] Intern pager: [phone], text pages welcome

## 2015-08-08 NOTE — Progress Notes (Signed)
Report given to Lauren from 6N.  Patient's wife, Myca Laos is here and informed her that patient is being transferred to [facility].

## 2015-08-08 NOTE — Progress Notes (Signed)
Physical Therapy Treatment Patient Details Name: [name] MRN: [id number] DOB: May 23, 1945 Today's Date: 08/08/2015    History of Present Illness [name] is a 70 y.o. male presenting with worsening hepatic encephalopathy. 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 He required intubation from 3/21-3/27 thought to be due to severe sleep apnea.     PT Comments    Patient much more alert and able to participate with therapy today. Requires increased time and simple one-step commands. Able to stand at EOB x 90 seconds with +2 assist and RW.   Follow Up Recommendations  SNF     Equipment Recommendations  None recommended by PT    Recommendations for Other Services OT consult     Precautions / Restrictions Precautions Precautions: Fall    Mobility  Bed Mobility Overal bed mobility: Needs Assistance Bed Mobility: Rolling;Sidelying to Sit;Sit to Sidelying Rolling: Min assist Sidelying to sit: Mod assist;HOB elevated;+2 for safety/equipment     Sit to sidelying: Min assist;+2 for safety/equipment General bed mobility comments: patient required step by step cues  Transfers Overall transfer level: Needs assistance Equipment used: Rolling walker (2 wheeled) Transfers: Sit to/from Stand Sit to Stand: Mod assist;+2 physical assistance         General transfer comment: vc for hand placement; assist due to weakness  Ambulation/Gait                 Stairs            Wheelchair Mobility    Modified Rankin (Stroke Patients Only)       Balance Overall balance assessment: Needs assistance Sitting-balance support: Bilateral upper extremity supported;Feet supported Sitting balance-Leahy Scale: Poor Sitting balance - Comments: minguard assist with occasional LOB posteriorly and unaware Postural control: Posterior  lean Standing balance support: Bilateral upper extremity supported Standing balance-Leahy Scale: Poor Standing balance comment: stood with heavy use of UEs on RW x 90sec                    Cognition Arousal/Alertness: Awake/alert Behavior During Therapy: WFL for tasks assessed/performed Overall Cognitive Status: Impaired/Different from baseline Area of Impairment: Orientation;Attention;Following commands;Problem solving Orientation Level: Place;Time;Situation Current Attention Level: Sustained   Following Commands: Follows one step commands with increased time     Problem Solving: Slow processing;Difficulty sequencing;Requires verbal cues;Requires tactile cues      Exercises General Exercises - Lower Extremity Ankle Circles/Pumps: AROM;Both;10 reps Quad Sets: AROM;Both;5 reps Long Arc Quad: AROM;Both;5 reps Heel Slides: AROM;Strengthening;Both;5 reps    General Comments        Pertinent Vitals/Pain Pain Assessment: Faces Faces Pain Scale: No hurt    Home Living                      Prior Function            PT Goals (current goals can now be found in the care plan section) Acute Rehab PT Goals Patient Stated Goal: patient to be medically ready for d/c to SNF Time For Goal Achievement: 08/20/15 Progress towards PT goals: Progressing toward goals    Frequency  Min 2X/week    PT Plan Current plan remains appropriate    Co-evaluation             End of Session Equipment Utilized During Treatment: Gait belt Activity Tolerance: Patient tolerated treatment well Patient left: in bed;with  bed alarm set;with call bell/phone within reach (returned to bed due to multiple bouts diarrhea today)     Time: [id number] PT Time Calculation (min) (ACUTE ONLY): 31 min  Charges:  $Therapeutic Exercise: 8-22 mins $Therapeutic Activity: 8-22 mins                    G Codes:      Jillane Po 09/04/2015, 12:26 PM Pager [phone]

## 2015-08-08 NOTE — Progress Notes (Signed)
Attempted to call for report. 

## 2015-08-09 LAB — CULTURE, BLOOD (ROUTINE X 2)
CULTURE: NO GROWTH
Culture: NO GROWTH

## 2015-08-09 LAB — BASIC METABOLIC PANEL
ANION GAP: 8 (ref 5–15)
BUN: 8 mg/dL (ref 6–20)
CALCIUM: 8.5 mg/dL — AB (ref 8.9–10.3)
CO2: 18 mmol/L — ABNORMAL LOW (ref 22–32)
CREATININE: 1.79 mg/dL — AB (ref 0.61–1.24)
Chloride: 110 mmol/L (ref 101–111)
GFR calc Af Amer: 42 mL/min — ABNORMAL LOW (ref >60)
GFR, EST NON AFRICAN AMERICAN: 36 mL/min — AB (ref >60)
GLUCOSE: 81 mg/dL (ref 65–99)
Potassium: 3.8 mmol/L (ref 3.5–5.1)
Sodium: 136 mmol/L (ref 135–145)

## 2015-08-09 LAB — GLUCOSE, CAPILLARY: Glucose-Capillary: 70 mg/dL (ref 65–99)

## 2015-08-09 MED ORDER — RIFAXIMIN 550 MG PO TABS
550.0000 mg | ORAL_TABLET | Freq: Two times a day (BID) | ORAL | Status: DC
  Administered 2015-08-09 – 2015-08-10 (×3): 550 mg via ORAL
  Filled 2015-08-09 (×3): qty 1

## 2015-08-09 MED ORDER — VITAMIN B-1 100 MG PO TABS
100.0000 mg | ORAL_TABLET | Freq: Every day | ORAL | Status: DC
  Administered 2015-08-10: 100 mg via ORAL
  Filled 2015-08-09: qty 1

## 2015-08-09 MED ORDER — RIFAXIMIN 550 MG PO TABS: 550.0000 mg | ORAL_TABLET | Freq: Two times a day (BID) | ORAL | Status: AC

## 2015-08-09 MED ORDER — LACTULOSE 10 GM/15ML PO SOLN: 30.0000 g | Freq: Four times a day (QID) | ORAL | Status: AC

## 2015-08-09 NOTE — Progress Notes (Signed)
Placed patient on CPAP for the night via auto-mode with minimum pressure set at 5cm and maximum pressure set at 20cm  

## 2015-08-09 NOTE — Evaluation (Signed)
Occupational Therapy Evaluation Patient Details Name: [name] MRN: [id number] DOB: [birth year] Today's Date: 08/09/2015    History of Present Illness [name] is a 70 y.o. male presenting with worsening hepatic encephalopathy. 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 He required intubation from 3/21-3/27 thought to be due to severe sleep apnea.    Clinical Impression   Pt admitted with the above diagnosis and has the deficits listed below.  Pt would benefit from cont OT to increase independence with basic adls so wife can handle pt at home.  Wife is available 24/7 but pt needs to increase his safety and independence with adls before returning home.     Follow Up Recommendations  SNF;Supervision/Assistance - 24 hour    Equipment Recommendations  3 in 1 bedside comode    Recommendations for Other Services       Precautions / Restrictions Precautions Precautions: Fall Restrictions Weight Bearing Restrictions: No      Mobility Bed Mobility Overal bed mobility: Needs Assistance Bed Mobility: Rolling;Sidelying to Sit;Sit to Sidelying Rolling: Min assist Sidelying to sit: Mod assist;HOB elevated;+2 for safety/equipment     Sit to sidelying: Min assist;+2 for safety/equipment General bed mobility comments: patient required step by step cues  Transfers Overall transfer level:  (deferred due to bleeding foot)               General transfer comment: Pt unable to get to feet due to bleeding L foot. Nursing asked to defer for now.    Balance Overall balance assessment: Needs assistance Sitting-balance support: Feet supported Sitting balance-Leahy Scale: Poor Sitting balance - Comments: minguard assist with occasional LOB posteriorly and unaware Postural control: Posterior lean                                   ADL Overall ADL's : Needs assistance/impaired Eating/Feeding: Minimal assistance;Sitting Eating/Feeding Details (indicate cue type and reason): min assist with cutting food and occasionally getting it to his mouth.  Assist with honey thick liquids. Grooming: Minimal assistance;Sitting;Cueing for sequencing Grooming Details (indicate cue type and reason): step by step instructions required to complete tasks.  Pt's UE weak so pt takes several breaks. Upper Body Bathing: Moderate assistance;Sitting Upper Body Bathing Details (indicate cue type and reason): assist needed to do thorough job.  Cues for sequencing. Lower Body Bathing: Total assistance;Sit to/from stand;Cueing for safety Lower Body Bathing Details (indicate cue type and reason): Pt with significant bleeding from R foot from wound on medial foot.  Pt had been bleeding and when bedsheets moved, blood noted all over bed.  Nursing in to change dressing and examine.   Upper Body Dressing : Moderate assistance;Sitting Upper Body Dressing Details (indicate cue type and reason): pt with difficulty getting arms over head. Lower Body Dressing: Total assistance;Sit to/from stand;Cueing for sequencing;Cueing for safety Lower Body Dressing Details (indicate cue type and reason): Pt unable to assist much with LE dressing. pt can sit on EOB but occasionally loses balance backwards.  Pt is unaware of LOB.  Bleeding in foot was worse in dependent positon so pt returned to bed.     Toileting- Clothing Manipulation and Hygiene: Total assistance;+2 for physical assistance;Bed level Toileting - Clothing Manipulation Details (indicate cue type and reason): pt had bowel movement in bed.  pt was unaware of this.  total assist x2 to clean up.  Pt did assist with rolling.       General ADL Comments: Pt dependent with most LE adls due to balance and weakness.  Decreased cognitive functioning limiting pts progress as well.     Vision Vision  Assessment?: No apparent visual deficits   Perception Perception Perception Tested?: No   Praxis Praxis Praxis tested?: Not tested    Pertinent Vitals/Pain Pain Assessment: No/denies pain     Hand Dominance Right   Extremity/Trunk Assessment Upper Extremity Assessment Upper Extremity Assessment: Generalized weakness   Lower Extremity Assessment Lower Extremity Assessment: Defer to PT evaluation RLE Sensation: history of peripheral neuropathy LLE Sensation: history of peripheral neuropathy   Cervical / Trunk Assessment Cervical / Trunk Assessment: Kyphotic   Communication Communication Communication: HOH   Cognition Arousal/Alertness: Awake/alert Behavior During Therapy: WFL for tasks assessed/performed Overall Cognitive Status: Impaired/Different from baseline Area of Impairment: Orientation;Attention;Memory;Following commands;Safety/judgement;Awareness;Problem solving Orientation Level: Place;Time;Situation Current Attention Level: Sustained Memory: Decreased short-term memory Following Commands: Follows one step commands with increased time Safety/Judgement: Decreased awareness of safety;Decreased awareness of deficits Awareness: Intellectual Problem Solving: Slow processing;Difficulty sequencing;Requires verbal cues;Requires tactile cues General Comments: pt was now aware of month but not year.  Thinks he is in [place] despite many redirections.   General Comments       Exercises       Shoulder Instructions      Home Living Family/patient expects to be discharged to:: Skilled nursing facility Living Arrangements: Spouse/significant other Available Help at Discharge: Family Type of Home: House Home Access: Stairs to enter [employer] of Steps: 4 Entrance Stairs-Rails: Right;Left Home Layout: Two level;Able to live on main level with bedroom/bathroom               Home Equipment: Walker - 2 wheels   Additional Comments: wife/son  present; state he only had 3 PT sessions at current SNF and agree he will need SNF on d/c      Prior Functioning/Environment Level of Independence: Needs assistance  Gait / Transfers Assistance Needed: recently working on standing with RW at SNF ADL's / Homemaking Assistance Needed: wife was assisting pt the last few days at home with bathing and dressing but otherwise she states pt was independent.          OT Diagnosis: Generalized weakness;Cognitive deficits;Altered mental status   OT Problem List: Decreased strength;Decreased activity tolerance;Impaired balance (sitting and/or standing);Decreased cognition;Decreased safety awareness;Decreased knowledge of use of DME or AE;Decreased knowledge of precautions;Increased edema;Impaired UE functional use   OT Treatment/Interventions: Self-care/ADL training;DME and/or AE instruction;Therapeutic activities    OT Goals(Current goals can be found in the care plan section) Acute Rehab OT Goals Patient Stated Goal: patient to be medically ready for d/c to SNF OT Goal Formulation: With patient/family Time For Goal Achievement: 08/23/15 Potential to Achieve Goals: Fair ADL Goals Pt Will Perform Grooming: with min assist;standing Pt Will Perform Lower Body Dressing: with mod assist;sit to/from stand Pt Will Transfer to Toilet: with min assist;bedside commode Pt Will Perform Toileting - Clothing Manipulation and hygiene: sitting/lateral leans;with min assist Pt Will Perform Tub/Shower Transfer: Shower transfer;shower seat;rolling walker;with min assist  OT Frequency: Min 2X/week   Barriers to D/C: Decreased caregiver support  wife only available to help 24/7 and he needs more than this at this opint.       Co-evaluation              End of Session Nurse Communication: Mobility status;Other (comment) (  bleeding L foot needing new dressing.)  Activity Tolerance: Patient limited by fatigue Patient left: in bed;with call bell/phone  within reach;with family/visitor present   Time: 1050-1130 OT Time Calculation (min): 40 min Charges:  OT General Charges $OT Visit: 1 Procedure OT Evaluation $OT Eval Moderate Complexity: 1 Procedure OT Treatments $Self Care/Home Management : 8-22 mins G-Codes:    Glenford Peers 09/14/2015, 11:53 AM  [phone]

## 2015-08-09 NOTE — Progress Notes (Signed)
Foley discontinued as ordered by MD. Condom catheter in place

## 2015-08-09 NOTE — Progress Notes (Signed)
Speech Language Pathology Treatment: Dysphagia  Patient Details Name: [name] MRN: [id number] DOB: 07/22/1945 Today's Date: 08/09/2015 Time: [id number] SLP Time Calculation (min) (ACUTE ONLY): 40 min  Assessment / Plan / Recommendation Clinical Impression  Pt demonstrates ongoing concern for delayed swallow response, which in MBS lead to silent aspiration of thin and nectar thick liquids. Used moderate verbal cues to encourage a second swallow and alternating between liquids and solids. Was able to fade to visual cues with repeated instruction, but pt unlikely to recall strategy independently. Trialed upgraded solids with success, pt able to masticate very well with little concern for increased viscosity of solids boluses than puree. Will upgrade diet to Dys 2 with honey thick liquids. Given apparent deficits requested cognitive linguisitic eval from MD, see next note.    HPI HPI: Pt is a 70 y.o. male presented to ED 4/20 presenting with hepatic encephalopathy. 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 Pt was seen yesterdasy 4/22 for a bedside swallow eval with recommendations for pt to be NPO given lethargy/ decreased awareness of bolus. Pt reportedly improving today 4/23, SLP eval re-ordered as there is consideration now of enteral feedings or NG.      SLP Plan  Continue with current plan of care     Recommendations  Diet recommendations: Dysphagia 2 (fine chop);Honey-thick liquid Liquids provided via: Cup Medication Administration: Crushed with puree Supervision: Full supervision/cueing for compensatory strategies Compensations: Minimize environmental distractions;Slow rate;Small sips/bites;Multiple dry swallows after each bite/sip Postural Changes and/or Swallow Maneuvers: Seated upright 90 degrees;Upright 30-60 min after meal             General recommendations: Rehab consult Oral Care  Recommendations: Oral care BID Follow up Recommendations: Inpatient Rehab Plan: Continue with current plan of care     GO                Eniya Cannady, Katherene Ponto 08/09/2015, 2:42 PM

## 2015-08-09 NOTE — Evaluation (Signed)
Speech Language Pathology Evaluation Patient Details Name: [name] MRN: [id number] DOB: 03-31-1946 Today's Date: 08/09/2015 Time: [id number] SLP Time Calculation (min) (ACUTE ONLY): 40 min  Problem List:  Patient Active Problem List   Diagnosis Date Noted  . Arterial hypotension   . Hematuria   . Hypernatremia   . AKI (acute kidney injury) ([place])   . Hypoxia   . Aspiration pneumonia of right middle lobe ([place])   . Sepsis ([place])   . Encephalopathy acute   . HCAP (healthcare-associated pneumonia)   . Encephalopathy   . Alcohol withdrawal ([place]) 07/12/2015  . Respiratory failure with hypoxia and hypercapnia ([place]) 07/05/2015  . Abdominal distention   . Alcohol abuse   . Cirrhosis ([place])   . Penile ulcer   . Acute respiratory failure with hypoxia and hypercapnia ([facility])   . Hepatic encephalopathy ([place]) 07/04/2015  . Acid indigestion 01/05/2015  . Cancer of esophagus ([place]) 01/05/2015  . Gout 01/05/2015  . S/P TIPS (transjugular intrahepatic portosystemic shunt)   . Ascites-treated 06/14/2014  . Abnormal magnetic resonance imaging study 05/16/2012  . Chronic cervical pain 05/16/2012  . Acute infection of nasal sinus 05/15/2012   Past Medical History:  Past Medical History  Diagnosis Date  . Hypertension     under control  . History of cancer chemotherapy   . Hx of radiation therapy 2002  . Gout     under control  . Umbilical hernia   . Fatty liver     "under control, being monitored"  . Shortness of breath dyspnea   . GERD (gastroesophageal reflux disease)   . Alcoholic cirrhosis ([place])   . Esophageal cancer ([place]) 2002    "partial" esophagus removed  . Sleep apnea     "will not get tested" (08/04/2015)  . Arthritis     "neck" (08/04/2015)   Past Surgical History:  Past Surgical History  Procedure Laterality Date  . Esophagectomy  2002    "partial"  . Incisional hernia repair  2004  . Umbilical hernia repair N/A 06/14/2014    Procedure:  OPEN UMBILICAL HERNIA  REPAIR;  Surgeon: Pedro Earls, MD;  Location: [facility];  Service: General;  Laterality: N/A;  . Tonsillectomy    . Radiology with anesthesia N/A 02/09/2015    Procedure: TIPS;  Surgeon: Jacqulynn Cadet, MD;  Location: [facility];  Service: Radiology;  Laterality: N/A;  . Hernia repair     HPI:  Pt is a 70 y.o. male presented to ED 4/20 presenting with hepatic encephalopathy. 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 Pt was seen yesterdasy 4/22 for a bedside swallow eval with recommendations for pt to be NPO given lethargy/ decreased awareness of bolus. Pt reportedly improving today 4/23, SLP eval re-ordered as there is consideration now of enteral feedings or NG.   Assessment / Plan / Recommendation Clinical Impression  Pt demonstrates ongoing significant cognitive impairment. He is pragmatically appropriate and can answer 1/3 direct orientation questions correctly, but later in conversation reveals disorientation to place and situation, stating he is in a hotel, perseverating on topics relating to his former employment, verbalizing very poor awareness of his deficits. Short term memory is significantly impaired, but improves with frequent verbal cues.  Carry over lasts for up to one minute. Pt also with significant ataxic dysarthria. In a reading task, pt was able to improve intelligiblity following a model with increased volume, slower rate and overarticulation. Will continue efforts  with speech intelligility, memory, and awareness.     SLP Assessment       Follow Up Recommendations  Inpatient Rehab    Frequency and Duration    2 weeks      SLP Evaluation Prior Functioning  Cognitive/Linguistic Baseline: Information not available Type of Home: House  Lives With: Spouse Available Help at Discharge: Family Vocation: Retired   Associate Professor  Overall Cognitive Status: Impaired/Different from  baseline Arousal/Alertness: Awake/alert Orientation Level: Oriented to person;Disoriented to place;Disoriented to time;Disoriented to situation Attention: Focused;Sustained Focused Attention: Appears intact Sustained Attention: Appears intact Memory: Impaired Memory Impairment: Storage deficit;Retrieval deficit;Decreased short term memory;Decreased recall of new information Decreased Short Term Memory: Verbal basic Awareness: Impaired Awareness Impairment: Intellectual impairment Safety/Judgment: Impaired    Comprehension  Auditory Comprehension Overall Auditory Comprehension: Appears within functional limits for tasks assessed Visual Recognition/Discrimination Discrimination: Within Function Limits    Expression Verbal Expression Overall Verbal Expression: Appears within functional limits for tasks assessed Written Expression Dominant Hand: Right   Oral / Motor  Oral Motor/Sensory Function Overall Oral Motor/Sensory Function: Other (comment) (slow motor movement, discoordinated) Motor Speech Overall Motor Speech: Impaired Respiration: Within functional limits Phonation: Normal Resonance: Hypernasality Articulation: Impaired Level of Impairment: Word Intelligibility: Intelligibility reduced Word: 75-100% accurate Phrase: 50-74% accurate Sentence: 50-74% accurate Conversation: 50-74% accurate Motor Planning: Witnin functional limits Motor Speech Errors: Unaware Effective Techniques: Slow rate;Increased vocal intensity;Over-articulate   GO                   Herbie [place], [place] CCC-SLP [id number]  Lynann Beaver 08/09/2015, 2:55 PM

## 2015-08-09 NOTE — Progress Notes (Signed)
CSW is continuing to follow for SNF placement when pt is medically stable.  Pt wife interested in closer facilities- no facilities in network with [employer] near pt home.    Facility checked out of network benefits- pt has max out of pocket of $10,000 for OON benefits meaning that they would pay full price for SNF stay until that OON max was met.  CSW attempted to call pt wife to discuss- left message  Domenica Reamer, [place] Social Worker [phone]

## 2015-08-09 NOTE — Progress Notes (Signed)
Foley discontinued this morning. Condom catheter placed but no output noted, bladder scanned with 255cc. MD paged

## 2015-08-09 NOTE — Consult Note (Signed)
[facility] Gastroenterology Consultation Note  Referring Provider: Dr. Esmond Camper (Family Practice Service) Primary Care Physician:  Lujean Amel, MD Primary Gastroenterologist:  Dr. Laurence Spates  Reason for Consultation:  Recurrent confusion  HPI: [name] is a 70 y.o. male history of alcohol cirrhosis (ongoing alcohol use), esophageal cancer s/p resection, refractory ascites with TIPS last Fall, admitted for recurrent confusion.  Patient's ammonia levels have been marginally elevated.  He is alert and oriented today.  He denies abdominal pain, nausea, vomiting, blood in stool, abdominal swelling, lower extremity swelling.  No fevers.  Had hypernatremia and worsening renal insufficiency during this admission, which have improved with medical management, and confusion has likewise improved as a result.   Past Medical History  Diagnosis Date  . Hypertension     under control  . History of cancer chemotherapy   . Hx of radiation therapy 2002  . Gout     under control  . Umbilical hernia   . Fatty liver     "under control, being monitored"  . Shortness of breath dyspnea   . GERD (gastroesophageal reflux disease)   . Alcoholic cirrhosis ([place])   . Esophageal cancer ([place]) 2002    "partial" esophagus removed  . Sleep apnea     "will not get tested" (08/04/2015)  . Arthritis     "neck" (08/04/2015)    Past Surgical History  Procedure Laterality Date  . Esophagectomy  2002    "partial"  . Incisional hernia repair  2004  . Umbilical hernia repair N/A 06/14/2014    Procedure:  OPEN UMBILICAL HERNIA REPAIR;  Surgeon: Pedro Earls, MD;  Location: [facility];  Service: General;  Laterality: N/A;  . Tonsillectomy    . Radiology with anesthesia N/A 02/09/2015    Procedure: TIPS;  Surgeon: Jacqulynn Cadet, MD;  Location: [facility];  Service: Radiology;  Laterality: N/A;  . Hernia repair      Prior to Admission medications   Medication Sig Start Date End Date Taking? Authorizing  Provider  allopurinol (ZYLOPRIM) 300 MG tablet Take 300 mg by mouth daily.  12/20/12  Yes Historical Provider, MD  budesonide (PULMICORT) 0.5 MG/2ML nebulizer solution Take 2 mLs (0.5 mg total) by nebulization 2 (two) times daily. 07/22/15  Yes Mercy Riding, MD  folic acid (FOLVITE) 1 MG tablet Take 1 tablet (1 mg total) by mouth daily. 07/22/15  Yes Mercy Riding, MD  ipratropium-albuterol (DUONEB) 0.5-2.5 (3) MG/3ML SOLN Take 3 mLs by nebulization 2 (two) times daily. 07/22/15  Yes Mercy Riding, MD  lactulose (CHRONULAC) 10 GM/15ML solution Take 45 mLs (30 g total) by mouth 4 (four) times daily. Titrate for 2 bowel movements a day. 07/22/15  Yes Mercy Riding, MD  omeprazole (PRILOSEC) 20 MG capsule Take 20 mg by mouth daily.  01/18/13  Yes Historical Provider, MD  midodrine (PROAMATINE) 5 MG tablet Take 1 tablet (5 mg total) by mouth 3 (three) times daily with meals. 07/22/15   Mercy Riding, MD  rifaximin (XIFAXAN) 200 MG tablet Take 2 tablets (400 mg total) by mouth 3 (three) times daily. 07/22/15   Mercy Riding, MD  thiamine 100 MG tablet Take 1 tablet (100 mg total) by mouth daily. 07/22/15   Mercy Riding, MD    Current Facility-Administered Medications  Medication Dose Route Frequency Provider Last Rate Last Dose  . acetaminophen (TYLENOL) suppository 325 mg  325 mg Rectal Q6H PRN Mercy Riding, MD   325 mg at 08/05/15 0143  .  budesonide (PULMICORT) nebulizer solution 0.5 mg  0.5 mg Nebulization BID Leone Brand, MD   0.5 mg at 08/09/15 0753  . dextrose 5 % and 0.45 % NaCl with KCl 20 mEq/L infusion   Intravenous Continuous Vivi Barrack, MD 100 mL/hr at 08/09/15 0235    . folic acid (FOLVITE) tablet 1 mg  1 mg Oral Daily Leone Brand, MD   1 mg at 08/08/15 0928  . heparin injection 5,000 Units  5,000 Units Subcutaneous Q8H Leone Brand, MD   5,000 Units at 08/09/15 0503  . lactulose (CHRONULAC) 10 GM/15ML solution 30 g  30 g Oral QID Leone Brand, MD   30 g at 08/08/15 2135  . midodrine (PROAMATINE)  tablet 5 mg  5 mg Oral TID WC Vivi Barrack, MD   5 mg at 08/09/15 0803  . pantoprazole (PROTONIX) EC tablet 40 mg  40 mg Oral Daily Leone Brand, MD   40 mg at 08/08/15 [id number]  . [place]   Oral PRN Lupita Dawn, MD      . rifaximin Doreene Nest) tablet 400 mg  400 mg Oral TID Leone Brand, MD   400 mg at 08/08/15 2135  . sodium chloride flush (NS) 0.9 % injection 3 mL  3 mL Intravenous Q12H Leone Brand, MD   3 mL at 08/08/15 2135  . thiamine (B-1) injection 100 mg  100 mg Intravenous Daily Vivi Barrack, MD   100 mg at 08/08/15 [id number]    Allergies as of 08/01/2015 - Review Complete 07/18/2015  Allergen Reaction Noted  . Amoxicillin Diarrhea 03/05/2013    Family History  Problem Relation Age of Onset  . Diabetes Father   . Diabetes Brother     Social History   Social History  . Marital Status: Married    Spouse Name: N/A  . Number of Children: N/A  . Years of Education: N/A   Occupational History  . Not on file.   Social History Main Topics  . Smoking status: Former Smoker -- 1.50 packs/day for 40 years    Types: Cigarettes    Quit date: 04/16/2004  . Smokeless tobacco: Never Used  . Alcohol Use: 50.4 oz/week    84 Glasses of wine per week     Comment: 08/04/2015 "@ least 2 bottles of wire/day"  . Drug Use: No  . Sexual Activity: No   Other Topics Concern  . Not on file   Social History Narrative    Review of Systems: Positive = bold Gen: Denies any fever, chills, rigors, night sweats, anorexia, fatigue, weakness, malaise, involuntary weight loss, and sleep disorder CV: Denies chest pain, angina, palpitations, syncope, orthopnea, PND, peripheral edema, and claudication. Resp: Denies dyspnea, cough, sputum, wheezing, snoring, coughing up blood. GI: Described in detail in HPI.    GU : Denies urinary burning, blood in urine, urinary frequency, urinary hesitancy, nocturnal urination, and urinary incontinence. MS: Denies joint pain or swelling.   Denies muscle weakness, cramps, atrophy.  Derm: Denies rash, itching, oral ulcerations, hives, unhealing ulcers.  Psych: Denies depression, anxiety, memory loss, suicidal ideation, hallucinations,  and confusion. Heme: Denies bruising, bleeding, and enlarged lymph nodes. Neuro:  Denies any headaches, dizziness, paresthesias. Endo:  Denies any problems with DM, thyroid, adrenal function.  Physical Exam: Vital signs in last 24 hours: Temp:  [97.5 F (36.4 C)-98.2 F (36.8 C)] 97.5 F (36.4 C) (04/25 0427) Pulse Rate:  [59-68] 65 (04/25 0427) Resp:  [  16-18] 18 (04/25 0427) BP: (95-125)/(54-67) 125/64 mmHg (04/25 0427) SpO2:  [96 %-100 %] 98 % (04/25 0754) Last BM Date: 08/08/15 General:   Alert to person, time, place; Well-developed, well-nourished, pleasant and cooperative in NAD Head:  Normocephalic and atraumatic. Eyes:  Sclera clear, no icterus.   Conjunctiva pink. Ears:  Normal auditory acuity. Nose:  No deformity, discharge,  or lesions. Mouth:  No deformity or lesions.  Oropharynx pink & moist. Neck:  Thick, Supple; no masses or thyromegaly.  Audible snoring, even while awake and with HOB elevated 30 degrees Lungs:  Clear throughout to auscultation.   No wheezes, crackles, or rhonchi. No acute distress. Heart:  Regular rate and rhythm; no murmurs, clicks, rubs,  or gallops. Abdomen:  Soft, nontender and nondistended. No appreciate ascites.  No masses, hepatosplenomegaly or hernias noted. Normal bowel sounds, without guarding, and without rebound.     Msk:  Symmetrical without gross deformities. Normal posture. Pulses:  Normal pulses noted. Extremities:  Without clubbing; trace edema Neurologic:  Alert and  oriented x 3;  Diffusely weak, but no focal neurologic deficits. Skin:  Scattered ecchymoses, otherwise Intact without significant lesions or rashes. Cervical Nodes:  No significant cervical adenopathy. Psych:  Alert and cooperative. Normal mood and affect.   Lab  Results: No results for input(s): WBC, HGB, HCT, PLT in the last 72 hours. BMET  Recent Labs  08/08/15 0325 08/08/15 1701 08/09/15 0623  NA 142 140 136  K 3.9 3.7 3.8  CL 115* 115* 110  CO2 17* 18* 18*  GLUCOSE 87 82 81  BUN 11 11 8   CREATININE 2.12* 2.00* 1.79*  CALCIUM 8.2* 8.5* 8.5*   LFT No results for input(s): PROT, ALBUMIN, AST, ALT, ALKPHOS, BILITOT, BILIDIR, IBILI in the last 72 hours. PT/INR No results for input(s): LABPROT, INR in the last 72 hours.  Studies/Results:  Impression:  1.  Recurrent confusion.  While certainly patient may have component of hepatic encephalopathy (perhaps incited by hypernatremia and tendency increased after TIPS), I am not convinced that this is the sole cause of his escalating confusion over the past few weeks.  I am very concerned he could be having recurrent significant hypoxia and/or hypercapnia from his seemingly pronounced obstructive sleep apnea.  Significant OSA with hypoxia/hypercapnia can certainly lend to recurrent bouts of confusion as well. 2.  Alcohol abuse, ongoing. 3.  Alcohol-mediated cirrhosis, complicated by ascites. 4.  Refractory ascites. 5.  Acute renal insufficiency with significant hypernatremia, possibly dehydration-mediated, improving with intravenous fluids.  Plan:  1.  Titrate lactulose dosing, goal for patient is 3-4 stools per day (less than 3 stools per day would render actions of lactulose useless).  Continue lactulose indefinitely. 2.  Continue rifaximin 550 mg po bid indefinitely. 3.  I would aggressive evaluate and manage patient for sleep apnea.   4.  Watch renal function and electrolytes closely. 5.  Would NOT reverse TIPS. 6.  Patient's overall health status is poor, including that of his liver, and I would expect continued overall slow progressive decline in his health, although hopefully we can help some with the above recommendations.  He is not liver transplant candidate due to his multiple  comorbidities as well as his alcohol use. 7.  [facility] will sign-off; will arrange outpatient follow-up with Dr. Oletta Lamas [phone]), patient's primary gastroenterologist.  Thank you for the consultation.   LOS: 5 days   Eisa Necaise M  08/09/2015, 8:34 AM  Pager [phone] If no answer or after 5 PM call  [phone]  

## 2015-08-09 NOTE — Progress Notes (Signed)
Family Medicine Teaching Service Daily Progress Note Intern Pager: [phone]  Patient name: [name] Medical record number: [id number] Date of birth: February 11, 1946 Age: 70 y.o. Gender: male  Primary Care Provider: Lujean Amel, MD Consultants: none Code Status: DNR  Pt Overview and Major Events to Date:  4/20: admitted with AMS and Hypernatremia  Assessment and Plan: [name] is a 70 y.o. male presenting with AMS likley from hypernatremia vs hepatic encephalopathy. 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 Likely 2/2 to severe hypernatremia vs. hepatic encephalopathy. Sepsis (hypothermic on admission) but blood and urine cultures NGTD. Drugs (benzos on UDS) also possibly playing a role. Patient AAO x4 except dates. -Vanc/zosyn 4/20-4/23 -Appreciate GI recs:  -Titrate lactulose for a goal of 3-4 BM a day.   - Rifaximin 550 mig twice a day indefinitely  - Outpt follow up with GI (Dr. Oletta Lamas)  - signed off -SLP recs: moderate to severe dysphagia risk. Recommended puree at slow rate. -PT recs: SNF. Requires increased time and simple one-step commands  Severe hypernatremia: resolved. Na 168 on admission, initial free water deficit of 9.7L. - Continue D5-1/2NS+20KCL@100  until taking PO well  Acute on CKD-3: improved. Cr 1.8. Cr 2.79 on admission(1.3-1.6 last hosp).  -Strict I/O: UOP 1.8L -Daily BMP -D/c foley today. Voiding trial  Hypotension: normotensive after midodrine 5mg . Likely secondary to cirrhosis.  - Restarted midodrine 5mg  three times a day  Snoring/?Sleep apnea: followed by outside pulmonology Dr. Corrie Dandy, was going to schedule outpatient sleep study - pulse ox - also restart home pulmicort nebs - CPAP at night. Not able to get BiPAP on floor  Penile ulcer: negative RPR and GC/C, overall appears to be resolving.   SCC S/p esophagectomy - continue  home PPI/protonix  FEN/GI:  Moderate to severe dysphagia risk. Recommended puree at slow rate. IVF as above  Prophylaxis: heparin sq  Disposition: continue floor. Possible discharge tomorrow if goo by mouth.  Subjective:  No events overnight. Denies chest pain or shortness of breath. Alert and oriented today. Reports eating some apple sauce this morning. Reports having big BM yesterday.   Objective: Temp:  [97.5 F (36.4 C)-98.2 F (36.8 C)] 97.5 F (36.4 C) (04/25 0427) Pulse Rate:  [59-68] 65 (04/25 0427) Resp:  [16-18] 18 (04/25 0427) BP: (95-125)/(54-67) 125/64 mmHg (04/25 0427) SpO2:  [96 %-100 %] 98 % (04/25 0754) Physical Exam: GEN: alert and awake, NAD Oropharynx: Tongue appears bigger but no sign of lesion and inflammation CVS: RRR, normal s1 and s2, no murmurs, no edema RESP: no increased work of breathing, good air movement bilaterally, no crackles or wheeze, course upper airway sound. GI: soft, non-tender,non-distended, +BS SKIN: some skin bruises on his left arm and shins likely from scratching himself.  NEURO: AAO x4  Laboratory:  Recent Labs Lab 08/04/15 1042 08/05/15 0529  WBC 5.4 5.6  HGB 11.4* 10.3*  HCT 34.6* 31.2*  PLT 104* 102*    Recent Labs Lab 08/04/15 1042  08/08/15 0325 08/08/15 1701 08/09/15 0623  NA 168*  < > 142 140 136  K 3.5  < > 3.9 3.7 3.8  CL >130*  < > 115* 115* 110  CO2 22  < > 17* 18* 18*  BUN 19  < > 11 11 8   CREATININE 2.08*  < > 2.12* 2.00* 1.79*  CALCIUM 10.8*  < > 8.2* 8.5* 8.5*  PROT 7.4  --   --   --   --  BILITOT 1.8*  --   --   --   --   ALKPHOS 157*  --   --   --   --   ALT 30  --   --   --   --   AST 41  --   --   --   --   GLUCOSE 99  < > 87 82 81  < > = values in this interval not displayed.  Imaging/Diagnostic Tests: None new.   Mercy Riding, MD 08/09/2015, 8:02 AM PGY-1, [place] Intern pager: [phone], text pages welcome

## 2015-08-09 NOTE — Progress Notes (Signed)
MD contacted regarding order to discontinue foley. Foley placed for chronic reasons. Waiting to hear back from MD. Foley remains in place

## 2015-08-09 NOTE — Discharge Summary (Signed)
[facility] Discharge Summary  Patient name: [name] Medical record number: [id number] Date of birth: 11/21/1945 Age: 70 y.o. Gender: male Date of Admission: 08/04/2015  Date of Discharge: 08/10/2015  Admitting Physician: Lupita Dawn, MD  Primary Care Provider: Lujean Amel, MD Consultants: GI  Indication for Hospitalization: Altered mental status, hypernatremia  Discharge Diagnoses/Problem List:  Cirrhosis with TIPs Encephalopathy CKD-3 Hypotension OSA Urinary retenition  Disposition: SNF  Discharge Condition: improved and stable  Discharge Exam:   Brief Hospital Course:  [name] is a 70 y.o. 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 Off note, received call from [facility] on 04/17 for transfer but patient showed up on our list on 04/20. So, it was unclear how patient was cared between 04/17 and 04/20.  Encephalopathy: improved. Likely 2/2 to severe hypernatremia (Na 168 on admission) vs. hepatic encephalopathy.  Patient with TIPS placement but ammonia 46 on admission. Urine drug screen positive for benzo on admission, which could have played a role in his mental status change and hypernatremia. He was started on Vanc and zosyn out of concern for sepsis due to hypothermia to 77F on admission but blood and urine cultures NGTD. So antibiotics were discontinued.  Patient was started on lactulose and rifaximin given history of cirrhosis and TIPS. GI was consulted and recommended titration of his lactulose for a goal of 3-4 bowel movements a day. They also recommended continuing rifaximin at 550 mg twice a day. Patient was also evaluated by speech lab and pathology prior to initiating oral intake. He was started on honey thick diet, which was advanced  gradually.  Prior to discharge, mental status improved. He was alert, awake and oriented to self, time, place and person. He has had good oral intake and take his medications crushed in honey thick diet. Recommend observation with his meals as he still have risk for aspiration.   Severe hypovolemic hypernatremia: resolved. Na 168 on admission, initial free water deficit of 9.7L. Patient received 3L of NS bolus on admission. The water deficit was replaced by D5W over three days with subsequent resolution of his hypernatremia.  Acute on CKD-3: improved. Cr trended fro 2.79 on admission to 1.79 on discharge. Baseline creatinine 1.3-1.6. Patient was admitted with foley from outside hospital. He had history of urinary retention. We discontinued foley but he failed voiding trial so foley had to be reinserted.  We recommend outpatient urology follow up as outpatient.   Hypotension: resolved. mildly hypotensive on admission. Hypotension resolved with IVF and midodrine 5mg  three times a day.  Snoring/OSA: patient on BiPAP during this admission. Settings for BiPAP are IPAP of 15 and EPAP 5. Mask size medium. Transitioned to CPAP when he was tranferred to regular floor and did well with that. Followed by outside pulmonology Dr. Corrie Dandy. Recommend nightly CPAP as outpatient.   Other chronic conditions stable.  Issues for Follow Up:  1. AMS: recommend avoiding medications such as benzo as we think this had role to play this admission with AMS. Continue lactulose for a goal of 3-4 bowel movements.  2. Liver cirrhosis s/p TIPS: continue lactulose for a goal of 3-4 bowel movements. Continue rifaximin as well. Patient to follow up with his GI (Dr. Oletta Lamas) in two weeks after discharge. 3. AKI/CKD-3: recommend checking his BMP in a week 4. Hypotension: discharged on midodrine  5 mg twice a day.  5. OSA: Dr. Corrie Dandy to follow on patient for sleep study during his prior admission in 04/2015. Recommend BiPAP at night  if available. Otherwise, CPAP. See above for BIPAP setting. 6. Urinary retention: admitted with foley from outside hospital. Failed voiding trial here. Recommend outpatient Urology follow up.  Significant Procedures: none  Significant Labs and Imaging:   Recent Labs Lab 08/04/15 1042 08/05/15 0529  WBC 5.4 5.6  HGB 11.4* 10.3*  HCT 34.6* 31.2*  PLT 104* 102*    Recent Labs Lab 08/04/15 1042  08/05/15 0133  08/07/15 0510 08/07/15 1647 08/08/15 0325 08/08/15 1701 08/09/15 0623  NA 168*  < > 161*  < > 144 142 142 140 136  K 3.5  < > 3.4*  < > 3.7 4.0 3.9 3.7 3.8  CL >130*  < > 128*  < > 117* 116* 115* 115* 110  CO2 22  < > 21*  < > 19* 16* 17* 18* 18*  GLUCOSE 99  < > 103*  < > 90 87 87 82 81  BUN 19  < > 20  < > 14 13 11 11 8   CREATININE 2.08*  < > 2.68*  < > 2.41* 2.28* 2.12* 2.00* 1.79*  CALCIUM 10.8*  < > 9.2  < > 8.2* 8.1* 8.2* 8.5* 8.5*  MG  --   --  1.7  --   --   --   --   --   --   ALKPHOS 157*  --   --   --   --   --   --   --   --   AST 41  --   --   --   --   --   --   --   --   ALT 30  --   --   --   --   --   --   --   --   ALBUMIN 3.5  --   --   --   --   --   --   --   --   < > = values in this interval not displayed.   Results/Tests Pending at Time of Discharge: none  Discharge Medications:    Medication List    TAKE these medications        allopurinol 300 MG tablet  Commonly known as:  ZYLOPRIM  Take 300 mg by mouth daily.     budesonide 0.5 MG/2ML nebulizer solution  Commonly known as:  PULMICORT  Take 2 mLs (0.5 mg total) by nebulization 2 (two) times daily.     folic acid 1 MG tablet  Commonly known as:  FOLVITE  Take 1 tablet (1 mg total) by mouth daily.     ipratropium-albuterol 0.5-2.5 (3) MG/3ML Soln  Commonly known as:  DUONEB  Take 3 mLs by nebulization 2 (two) times daily.     lactulose 10 GM/15ML solution  Commonly known as:  CHRONULAC  Take 45 mLs (30 g total) by mouth 4 (four) times daily. Titrate for 3-4 bowel movements  a day.     midodrine 5 MG tablet  Commonly known as:  PROAMATINE  Take 1 tablet (5 mg total) by mouth 3 (three) times daily with meals.     omeprazole 20 MG capsule  Commonly known as:  PRILOSEC  Take 20 mg by mouth daily.     rifaximin 550 MG Tabs tablet  Commonly known as:  XIFAXAN  Take 1 tablet (550 mg total) by mouth 2 (two) times daily.     thiamine 100 MG tablet  Take 1 tablet (100 mg total) by mouth daily.        Discharge Instructions: Please refer to Patient Instructions section of EMR for full details.  Patient was counseled important signs and symptoms that should prompt return to medical care, changes in medications, dietary instructions, activity restrictions, and follow up appointments.   Follow-Up Appointments: Follow-up Information    Follow up with EDWARDS JR,JAMES L, MD. Schedule an appointment as soon as possible for a visit in 2 weeks.   Specialty:  Gastroenterology   Why:  for follow up   Contact information:   [address] [place] [address] [phone]       Mercy Riding, MD 08/10/2015, 12:26 PM PGY-1, [place]

## 2015-08-10 NOTE — Clinical Social Work Note (Addendum)
Late entry: CSW received noticed after hours that SNF was able to get CPAP into the building and that patient was, in fact, able to discharge today to [facility].  CSW requested assistance of RN to inform wife who was at bedside.  RN agreeable.  Received report from SNF that CPAP machine was unable to be delivered so late in the day and patient cannot be admitted without CPAP machine.  Discharge tomorrow (Thursday) to [facility].  Nonnie Done, LCSW [phone]  2H 1-14; [place] Licensed Clinical Social Worker

## 2015-08-10 NOTE — Clinical Social Work Placement (Signed)
   CLINICAL SOCIAL WORK PLACEMENT  NOTE  Date:  08/10/2015  Patient Details  Name: [name] MRN: [id number] Date of Birth: [birth year]  Clinical Social Work is seeking post-discharge placement for this patient at the [place] level of care (*CSW will initial, date and re-position this form in  chart as items are completed):  Yes   Patient/family provided with [facility] Work Department's list of facilities offering this level of care within the geographic area requested by the patient (or if unable, by the patient's family).  Yes   Patient/family informed of their freedom to choose among providers that offer the needed level of care, that participate in Medicare, Medicaid or managed care program needed by the patient, have an available bed and are willing to accept the patient.  Yes   Patient/family informed of [facility]'s ownership interest in [facility] and [facility], as well as of the fact that they are under no obligation to receive care at these facilities.  PASRR submitted to EDS on       PASRR number received on       Existing PASRR number confirmed on 08/05/15     FL2 transmitted to all facilities in geographic area requested by pt/family on 08/05/15     FL2 transmitted to all facilities within larger geographic area on       Patient informed that his/her managed care company has contracts with or will negotiate with certain facilities, including the following:            Patient/family informed of bed offers received.  Patient chooses bed at       Physician recommends and patient chooses bed at      Patient to be transferred to   on  .  Patient to be transferred to facility by       Patient family notified on   of transfer.  Name of family member notified:        PHYSICIAN Please sign FL2, Please sign DNR     Additional Comment:    _______________________________________________ Dulcy Fanny,  LCSW 08/10/2015, 2:38 PM

## 2015-08-10 NOTE — Progress Notes (Signed)
Patient discharged to [facility] , report was given to nurse [facility].

## 2015-08-10 NOTE — Progress Notes (Signed)
CSW received call from patient's wife. She would like patient to go to [facility]. CSW alerted facility to start insurance authorization.   Percell Locus Mid[place] Daughety LCSWA [phone]

## 2015-08-10 NOTE — Progress Notes (Signed)
Speech Language Pathology Treatment: Dysphagia;Cognitive-Linquistic  Patient Details Name: [name] MRN: [id number] DOB: [birth year] Today's Date: 08/10/2015 Time: [id number] SLP Time Calculation (min) (ACUTE ONLY): 38 min  Assessment / Plan / Recommendation Clinical Impression  Pt seen for skilled speech/cognitive and dysphagia therapy. Pt confusion is significantly decreased as compared to previous session. O x 4 generally and able to follow 3-step directives with 3/4 acc. Pt benefits from decreased distractions in the room to focus on therapy activities. Pt demonstrated recall of dysphagia education provided with a 30 minute delay and min verbal cueing. Swallow function appears to be improving. Vocal quality remained clear with small boluses of thin (via spoon and cup). However, pt had silent aspiration on previous MBSS so, lack of s/s aspiration gives low confidence for tolerance. Also, pt demonstrates an audible gurgly swallow which may be consistent with esophagectomy versus pharyngeal dysphagia, however this is impossible to know at bedside.   A repeat MBSS is warranted prior to discharge from acute. Prefer to reassess with MBSS tomorrow to maximize opportunity for improvement. RN to page SLP if pt is to be discharged before tomorrow.   HPI HPI: Pt is a 70 y.o. male presented to ED 4/20 presenting with hepatic encephalopathy. 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 Pt was seen yesterdasy 4/22 for a bedside swallow eval with recommendations for pt to be NPO given lethargy/ decreased awareness of bolus. Pt reportedly improving today 4/23, SLP eval re-ordered as there is consideration now of enteral feedings or NG.      SLP Plan  Continue with current plan of care;MBS     Recommendations  Diet recommendations: Dysphagia 2 (fine chop);Honey-thick liquid (pending reasssessment via MBSS) Liquids provided  via: Cup Medication Administration: Crushed with puree Supervision: Full supervision/cueing for compensatory strategies Compensations: Minimize environmental distractions;Slow rate;Small sips/bites;Multiple dry swallows after each bite/sip Postural Changes and/or Swallow Maneuvers: Seated upright 90 degrees;Upright 30-60 min after meal             General recommendations: Rehab consult Oral Care Recommendations: Oral care BID Follow up Recommendations: Inpatient Rehab Plan: Continue with current plan of care;MBS     [place], Mercedes Pager [phone] 08/10/2015, 10:00 AM

## 2015-08-12 ENCOUNTER — Non-Acute Institutional Stay (SKILLED_NURSING_FACILITY): Payer: Medicare HMO | Admitting: Internal Medicine

## 2015-08-12 ENCOUNTER — Encounter: Payer: Self-pay | Admitting: Internal Medicine

## 2015-08-12 DIAGNOSIS — K219 Gastro-esophageal reflux disease without esophagitis: Secondary | ICD-10-CM | POA: Diagnosis not present

## 2015-08-12 DIAGNOSIS — I739 Peripheral vascular disease, unspecified: Secondary | ICD-10-CM | POA: Diagnosis not present

## 2015-08-12 DIAGNOSIS — D539 Nutritional anemia, unspecified: Secondary | ICD-10-CM | POA: Diagnosis not present

## 2015-08-12 DIAGNOSIS — K7290 Hepatic failure, unspecified without coma: Secondary | ICD-10-CM | POA: Diagnosis not present

## 2015-08-12 DIAGNOSIS — R339 Retention of urine, unspecified: Secondary | ICD-10-CM

## 2015-08-12 DIAGNOSIS — D696 Thrombocytopenia, unspecified: Secondary | ICD-10-CM

## 2015-08-12 DIAGNOSIS — R5381 Other malaise: Secondary | ICD-10-CM | POA: Diagnosis not present

## 2015-08-12 DIAGNOSIS — M1A9XX0 Chronic gout, unspecified, without tophus (tophi): Secondary | ICD-10-CM | POA: Diagnosis not present

## 2015-08-12 DIAGNOSIS — K7031 Alcoholic cirrhosis of liver with ascites: Secondary | ICD-10-CM | POA: Diagnosis not present

## 2015-08-12 DIAGNOSIS — N183 Chronic kidney disease, stage 3 unspecified: Secondary | ICD-10-CM

## 2015-08-12 DIAGNOSIS — R1310 Dysphagia, unspecified: Secondary | ICD-10-CM

## 2015-08-12 DIAGNOSIS — K7682 Hepatic encephalopathy: Secondary | ICD-10-CM

## 2015-08-12 NOTE — Progress Notes (Signed)
LOCATION: [name]  PCP: Lujean Amel, MD   Code Status: DNR  Goals of care: Advanced Directive information Advanced Directives 08/12/2015  Does patient have an advance directive? Yes  Type of Advance Directive Out of facility DNR (pink MOST or yellow form)  Does patient want to make changes to advanced directive? No - Patient declined  Copy of advanced directive(s) in chart? Yes       Extended Emergency Contact Information Primary Emergency Contact: Ford,Geraldine Address: [address]          [place], [place] [address] Johnnette Litter of [place] Phone: [phone] Mobile Phone: [phone] Relation: Spouse   Allergies  Allergen Reactions  . Amoxicillin Diarrhea    Chief Complaint  Patient presents with  . New Admit To SNF    New Admission     HPI:  Patient is a 70 y.o. male seen today for short term rehabilitation post hospital admission from 08/04/15-08/10/15 with altered mental status. This was thought to be from hypernatremia, hepatic encephalopathy and possible benzodiazepine use. He was started initially on antibiotics which were discontinued with culture showing no growth.  Lactulose and rifaxamin were started and GI were consulted. He was seen by SLP team and placed on dysphagia diet. His hypernatremia was corrected with D5W. He has foley catheter for urinary retention and has failed voiding trial in the hospital. 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 He is seen in his room today.   Review of Systems:  Constitutional: Negative for fever, chills, diaphoresis. Energy level slowly returning. HENT: Negative for headache, congestion, nasal discharge, difficulty swallowing. Wears hearing aids.   Eyes: Negative for blurred vision, double vision and discharge.  Respiratory: Negative for  shortness of breath and wheezing. Positive for cough with  clear phlegm.  Cardiovascular: Negative for chest pain, palpitations. Positive for leg swelling.  Gastrointestinal: Negative for heartburn, nausea, vomiting, abdominal pain. Last bowel movement was yesterday. Genitourinary: Negative for dysuria and flank pain.  Musculoskeletal: Negative for back pain, fall.  Skin: Negative for itching, rash.  Neurological: Negative for weakness. Positive for dizziness with change of position. Psychiatric/Behavioral: Negative for depression.    Past Medical History  Diagnosis Date  . Hypertension     under control  . History of cancer chemotherapy   . Hx of radiation therapy 2002  . Gout     under control  . Umbilical hernia   . Fatty liver     "under control, being monitored"  . Shortness of breath dyspnea   . GERD (gastroesophageal reflux disease)   . Alcoholic cirrhosis ([place])   . Esophageal cancer ([place]) 2002    "partial" esophagus removed  . Sleep apnea     "will not get tested" (08/04/2015)  . Arthritis     "neck" (08/04/2015)   Past Surgical History  Procedure Laterality Date  . Esophagectomy  2002    "partial"  . Incisional hernia repair  2004  . Umbilical hernia repair N/A 06/14/2014    Procedure:  OPEN UMBILICAL HERNIA REPAIR;  Surgeon: Pedro Earls, MD;  Location: [facility];  Service: General;  Laterality: N/A;  . Tonsillectomy    . Radiology with anesthesia N/A 02/09/2015    Procedure: TIPS;  Surgeon: Jacqulynn Cadet, MD;  Location: [place];  Service: Radiology;  Laterality: N/A;  . Hernia repair     Social History:   reports that he quit smoking  about 11 years ago. His smoking use included Cigarettes. He has a 60 pack-year smoking history. He has never used smokeless tobacco. He reports that he drinks about 50.4 oz of alcohol per week. He reports that he does not use illicit drugs.  Family History  Problem Relation Age of Onset  . Diabetes Father   . Diabetes Brother     Medications:   Medication List       This list is  accurate as of: 08/12/15  4:59 PM.  Always use your most recent med list.               allopurinol 300 MG tablet  Commonly known as:  ZYLOPRIM  Take 300 mg by mouth daily.     budesonide 0.5 MG/2ML nebulizer solution  Commonly known as:  PULMICORT  Take 2 mLs (0.5 mg total) by nebulization 2 (two) times daily.     folic acid 1 MG tablet  Commonly known as:  FOLVITE  Take 1 tablet (1 mg total) by mouth daily.     ipratropium-albuterol 0.5-2.5 (3) MG/3ML Soln  Commonly known as:  DUONEB  Take 3 mLs by nebulization 2 (two) times daily.     lactulose 10 GM/15ML solution  Commonly known as:  CHRONULAC  Take 45 mLs (30 g total) by mouth 4 (four) times daily. Titrate for 3-4 bowel movements a day.     midodrine 5 MG tablet  Commonly known as:  PROAMATINE  Take 1 tablet (5 mg total) by mouth 3 (three) times daily with meals.     omeprazole 20 MG capsule  Commonly known as:  PRILOSEC  Take 20 mg by mouth daily.     rifaximin 550 MG Tabs tablet  Commonly known as:  XIFAXAN  Take 1 tablet (550 mg total) by mouth 2 (two) times daily.     thiamine 100 MG tablet  Take 1 tablet (100 mg total) by mouth daily.        Immunizations: Immunization History  Administered Date(s) Administered  . PPD Test 08/10/2015  . Pneumococcal Polysaccharide-23 07/10/2015     Physical Exam: Filed Vitals:   08/12/15 1653  BP: 121/69  Pulse: 63  Temp: 97.3 F (36.3 C)  TempSrc: Oral  Resp: 20  SpO2: 94%    General- elderly frail and ill appearing, in no acute distress Head- normocephalic, atraumatic Throat- moist mucus membrane  Eyes- PERRLA, EOMI, no pallor, + icterus Neck- no cervical lymphadenopathy Cardiovascular- normal s1,s2, no murmur, + leg edema Respiratory- bilateral clear to auscultation, no wheeze, no rhonchi, no crackles, no use of accessory muscles Abdomen- bowel sounds present, soft, non tender but distended, has foley catheter Musculoskeletal- able to move all 4  extremities, mild asterexsis present, generalized weakness Neurological- alert and oriented to person, place and time Skin- warm and dry, skin tear to left arm, dressing to left leg clean and dry Psychiatry- normal mood and affect    Labs reviewed: Basic Metabolic Panel:  Recent Labs  07/05/15 2036  07/07/15 0455  07/09/15 0231  08/05/15 0133  08/08/15 0325 08/08/15 1701 08/09/15 0623  NA 131*  < > 135  < > 138  < > 161*  < > 142 140 136  K 3.9  < > 3.0*  < > 3.0*  < > 3.4*  < > 3.9 3.7 3.8  CL 97*  < > 103  < > 108  < > 128*  < > 115* 115* 110  CO2 25  < >  21*  < > 20*  < > 21*  < > 17* 18* 18*  GLUCOSE 114*  < > 80  < > 89  < > 103*  < > 87 82 81  BUN 18  < > 14  < > 10  < > 20  < > 11 11 8   CREATININE 1.62*  < > 1.66*  < > 1.39*  < > 2.68*  < > 2.12* 2.00* 1.79*  CALCIUM 8.3*  < > 7.5*  < > 7.9*  < > 9.2  < > 8.2* 8.5* 8.5*  MG 1.3*  --  1.6*  --  1.8  --  1.7  --   --   --   --   PHOS 3.5  --  2.1*  --  2.3*  --   --   --   --   --   --   < > = values in this interval not displayed. Liver Function Tests:  Recent Labs  07/13/15 0420 07/16/15 0308 08/04/15 1042  AST 48* 80* 41  ALT 32 50 30  ALKPHOS 98 147* 157*  BILITOT 0.9 1.7* 1.8*  PROT 6.2* 7.0 7.4  ALBUMIN 3.0* 3.5 3.5   No results for input(s): LIPASE, AMYLASE in the last 8760 hours.  Recent Labs  07/11/15 0240 08/04/15 1042 08/05/15 0529  AMMONIA 69* 23 48*   CBC:  Recent Labs  07/21/15 0446 08/04/15 1042 08/05/15 0529  WBC 4.1 5.4 5.6  NEUTROABS  --  3.4  --   HGB 9.4* 11.4* 10.3*  HCT 27.3* 34.6* 31.2*  MCV 99.3 102.4* 101.6*  PLT 111* 104* 102*   Cardiac Enzymes:  Recent Labs  08/04/15 1042 08/04/15 1450 08/07/15 0146  CKTOTAL  --  75  --   TROPONINI 0.04*  --  0.03   BNP: Invalid input(s): POCBNP CBG:  Recent Labs  08/05/15 0855 08/06/15 2242 08/09/15 0758  GLUCAP 71 83 70    Radiological Exams: Dg Chest 2 View  07/20/2015  CLINICAL DATA:  Shortness of breath,  weakness, pneumonia EXAM: CHEST  2 VIEW COMPARISON:  07/18/2015 FINDINGS: Cardiomegaly again noted. Limited study by poor inspiration. Central mild vascular congestion and mild infrahilar interstitial prominence suspicious for minimal interstitial edema. Hazy left basilar atelectasis or infiltrate. IMPRESSION: Central mild vascular congestion and mild infrahilar interstitial prominence suspicious for minimal interstitial edema. Hazy left basilar atelectasis or infiltrate. Electronically Signed   By: Lahoma Crocker M.D.   On: 07/20/2015 11:48   Dg Chest Port 1 View  08/07/2015  CLINICAL DATA:  Aspiration into respiratory tract, initial encounter EXAM: PORTABLE CHEST 1 VIEW COMPARISON:  08/04/2015 FINDINGS: Stable enlarged cardiac silhouette. Mild increase in LEFT basilar atelectasis. No pulmonary edema or infiltrate. No pneumothorax IMPRESSION: Cardiomegaly and LEFT basilar atelectasis. Electronically Signed   By: Suzy Bouchard M.D.   On: 08/07/2015 09:29   Dg Chest Port 1 View  08/04/2015  CLINICAL DATA:  Hypoxia EXAM: PORTABLE CHEST 1 VIEW COMPARISON:  07/20/2015 FINDINGS: There are low lung volumes. There is no evidence of no pneumonia or of residual interstitial edema. Mild medial lung base opacity is noted consistent with atelectasis. No convincing pleural effusion. No pneumothorax. Cardiac silhouette is normal in size. No mediastinal or hilar masses or convincing adenopathy. IMPRESSION: 1. Improved lung aeration since the prior study. No convincing pneumonia or edema. Electronically Signed   By: Lajean Manes M.D.   On: 08/04/2015 11:12   Dg Chest Port 1 View  07/18/2015  CLINICAL DATA:  Desaturation, history of congestive heart failure EXAM: PORTABLE CHEST 1 VIEW COMPARISON:  07/18/2015 at 12:01 a.m. FINDINGS: Cardiac enlargement stable. Mild hazy perihilar opacity bilaterally. Vascular pattern is normal. No definite pleural effusion. IMPRESSION: Mild perihilar haziness. In the absence of vascular  congestion or interstitial edema it is felt this is more likely due to atelectasis rather than pulmonary edema but follow-up is recommended. Electronically Signed   By: Skipper Cliche M.D.   On: 07/18/2015 07:15   Dg Chest Port 1 View  07/18/2015  CLINICAL DATA:  Hypothermia EXAM: PORTABLE CHEST 1 VIEW COMPARISON:  07/11/2015 FINDINGS: Cardiac shadow is enlarged but stable. The endotracheal tube is been removed. Lungs are clear bilaterally. No acute bony abnormality is noted. IMPRESSION: No acute abnormality seen. Electronically Signed   By: Inez Catalina M.D.   On: 07/18/2015 00:30   Dg Swallowing Func-speech Pathology  08/07/2015  Objective Swallowing Evaluation: Type of Study: MBS-Modified Barium Swallow Study Patient Details Name: [name] MRN: [id number] Date of Birth: 02-19-1946 Today's Date: 08/07/2015 Time: SLP Start Time (ACUTE ONLY): 1245-SLP Stop Time (ACUTE ONLY): 1315 SLP Time Calculation (min) (ACUTE ONLY): 30 min Past Medical History: Past Medical History Diagnosis Date . Hypertension    under control . History of cancer chemotherapy  . Hx of radiation therapy 2002 . Gout    under control . Umbilical hernia  . Fatty liver    "under control, being monitored" . Shortness of breath dyspnea  . GERD (gastroesophageal reflux disease)  . Alcoholic cirrhosis ([place])  . Esophageal cancer ([place]) 2002   "partial" esophagus removed . Sleep apnea    "will not get tested" (08/04/2015) . Arthritis    "neck" (08/04/2015) Past Surgical History: Past Surgical History Procedure Laterality Date . Esophagectomy  2002   "partial" . Incisional hernia repair  2004 . Umbilical hernia repair N/A 06/14/2014   Procedure:  OPEN UMBILICAL HERNIA REPAIR;  Surgeon: Pedro Earls, MD;  Location: [facility];  Service: General;  Laterality: N/A; . Tonsillectomy   . Radiology with anesthesia N/A 02/09/2015   Procedure: TIPS;  Surgeon: Jacqulynn Cadet, MD;  Location: [place];  Service: Radiology;  Laterality: N/A; . Hernia repair   HPI:  Pt is a 70 y.o. male presented to ED 4/20 presenting with hepatic encephalopathy. 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 Pt was seen yesterdasy 4/22 for a bedside swallow eval with recommendations for pt to be NPO given lethargy/ decreased awareness of bolus. Pt reportedly improving today 4/23, SLP eval re-ordered as there is consideration now of enteral feedings or NG. Subjective: Pt sitting up in chair at bedside and able to recall details of this hospitalization. Assessment / Plan / Recommendation CHL IP CLINICAL IMPRESSIONS 08/07/2015 Therapy Diagnosis Moderate oral phase dysphagia;Moderate pharyngeal phase dysphagia Clinical Impression Pt currently demonstrating a moderate oropharyngeal dysphagia which appears to be cognitive-based. Pt had a delay in swallow initiation to the level of the valleculae or pyriform sinuses across consistencies, resulting in frank silent aspiration of thin liquids and trace penetration of nectar-thick liquids. No penetration/ aspiration of honey-thick or puree consistencies noted. However, the pt did have mild-moderate amounts of residuals in the valleculae post-swallow resulting from reduced tongue base retraction and needed max cues to swallow a 2nd time. Most of the residuals were cleared with a 2nd swallow. Pt was easily distracted during the evaluation which appeared to be a contributing factor in swallow function  observed. Pt is at high risk of aspiration of thin and nectar-thick liquids. Recommend initiating diet of dysphagia 1, honey-thick liquids, meds crushed in puree or via alternative means if cannot crush. Full supervision during meals to cue for small bites/ sips, cue pt to swallow 2 times with each bite/ sip, minimize distractions. Will continue to follow for diet tolerance/ consider advancement. Impact on safety and function Moderate aspiration risk;Severe aspiration risk   CHL IP  TREATMENT RECOMMENDATION 08/07/2015 Treatment Recommendations Therapy as outlined in treatment plan below   Prognosis 08/07/2015 Prognosis for Safe Diet Advancement Good Barriers to Reach Goals Cognitive deficits Barriers/Prognosis Comment -- CHL IP DIET RECOMMENDATION 08/07/2015 SLP Diet Recommendations Dysphagia 1 (Puree) solids;Honey thick liquids Liquid Administration via Spoon;Cup Medication Administration Crushed with puree Compensations Minimize environmental distractions;Slow rate;Small sips/bites;Multiple dry swallows after each bite/sip Postural Changes Remain semi-upright after after feeds/meals (Comment);Seated upright at 90 degrees   CHL IP OTHER RECOMMENDATIONS 08/07/2015 Recommended Consults -- Oral Care Recommendations Oral care BID Other Recommendations Have oral suction available;Clarify dietary restrictions   CHL IP FOLLOW UP RECOMMENDATIONS 08/07/2015 Follow up Recommendations 24 hour supervision/assistance   CHL IP FREQUENCY AND DURATION 08/07/2015 Speech Therapy Frequency (ACUTE ONLY) min 2x/week Treatment Duration 2 weeks      CHL IP ORAL PHASE 08/07/2015 Oral Phase Impaired Oral - Pudding Teaspoon -- Oral - Pudding Cup -- Oral - Honey Teaspoon -- Oral - Honey Cup Lingual/palatal residue;Delayed oral transit;Piecemeal swallowing;Reduced posterior propulsion Oral - Nectar Teaspoon -- Oral - Nectar Cup Reduced posterior propulsion;Lingual/palatal residue;Piecemeal swallowing;Delayed oral transit Oral - Nectar Straw -- Oral - Thin Teaspoon -- Oral - Thin Cup Reduced posterior propulsion;Delayed oral transit Oral - Thin Straw -- Oral - Puree Reduced posterior propulsion;Lingual/palatal residue;Piecemeal swallowing;Delayed oral transit Oral - Mech Soft -- Oral - Regular -- Oral - Multi-Consistency -- Oral - Pill -- Oral Phase - Comment --  CHL IP PHARYNGEAL PHASE 08/07/2015 Pharyngeal Phase Impaired Pharyngeal- Pudding Teaspoon -- Pharyngeal -- Pharyngeal- Pudding Cup -- Pharyngeal -- Pharyngeal- Honey  Teaspoon -- Pharyngeal -- Pharyngeal- Honey Cup Delayed swallow initiation-vallecula;Reduced tongue base retraction;Pharyngeal residue - valleculae Pharyngeal -- Pharyngeal- Nectar Teaspoon -- Pharyngeal -- Pharyngeal- Nectar Cup Delayed swallow initiation-vallecula;Reduced tongue base retraction;Pharyngeal residue - valleculae Pharyngeal -- Pharyngeal- Nectar Straw -- Pharyngeal -- Pharyngeal- Thin Teaspoon -- Pharyngeal -- Pharyngeal- Thin Cup Delayed swallow initiation-pyriform sinuses;Reduced tongue base retraction;Penetration/Aspiration before swallow;Moderate aspiration;Pharyngeal residue - valleculae;Pharyngeal residue - pyriform Pharyngeal Material enters airway, passes BELOW cords without attempt by patient to eject out (silent aspiration) Pharyngeal- Thin Straw -- Pharyngeal -- Pharyngeal- Puree Delayed swallow initiation-vallecula;Reduced tongue base retraction;Pharyngeal residue - valleculae Pharyngeal -- Pharyngeal- Mechanical Soft -- Pharyngeal -- Pharyngeal- Regular -- Pharyngeal -- Pharyngeal- Multi-consistency -- Pharyngeal -- Pharyngeal- Pill -- Pharyngeal -- Pharyngeal Comment --  CHL IP CERVICAL ESOPHAGEAL PHASE 08/07/2015 Cervical Esophageal Phase WFL Pudding Teaspoon -- Pudding Cup -- Honey Teaspoon -- Honey Cup -- Nectar Teaspoon -- Nectar Cup -- Nectar Straw -- Thin Teaspoon -- Thin Cup -- Thin Straw -- Puree -- Mechanical Soft -- Regular -- Multi-consistency -- Pill -- Cervical Esophageal Comment -- CHL IP GO 07/13/2015 Functional Assessment Tool Used skilled clinical judgment Functional Limitations Swallowing Swallow Current Status [id number]) CI Swallow Goal Status [id number]) [place] Swallow Discharge Status [id number]) (None) Motor Speech Current Status [id number]) (None) Motor Speech Goal Status [id number]) (None) Motor Speech Goal Status [id number]) (None) Spoken Language Comprehension Current Status [id number]) (None) Spoken Language Comprehension Goal Status [id number]) (None) Spoken Language Comprehension Discharge  Status [phone]) (None)  Spoken Language Expression Current Status [id number]) (None) Spoken Language Expression Goal Status [phone]) (None) Spoken Language Expression Discharge Status [phone]) (None) Attention Current Status [phone]) (None) Attention Goal Status [id number]) (None) Attention Discharge Status [phone]) (None) Memory Current Status [id number]) (None) Memory Goal Status [id number]) (None) Memory Discharge Status [id number]) (None) Voice Current Status [id number]) (None) Voice Goal Status [id number]) (None) Voice Discharge Status [phone]) (None) Other Speech-Language Pathology Functional Limitation [phone]) (None) Other Speech-Language Pathology Functional Limitation Goal Status [id number]) (None) Other Speech-Language Pathology Functional Limitation Discharge Status [phone]) (None) Kern Reap, MA, CCC-SLP 08/07/2015, 1:48 PM [phone]              Assessment/Plan  Physical deconditioning Will have him work with physical therapy and occupational therapy team to help with gait training and muscle strengthening exercises.fall precautions. Skin care. Encourage to be out of bed.   Hepatic encephalopathy Continue lactulose 45 cc/ 30 g qid for now and monitor for 2-3 bowel movement a day, check cmp.  etoh Liver cirrhosis S/p TIPS. Continue rifaxamin, to follow up with GI team. Monitor weight. Continue thiamine and folic acid  Hypotension Monitor his vital signs, continue midodrine 5 mg tid  Urinary retention Has failed voiding trial, continue foley catheter, will need follow up with urology  Macrocytic anemia With liver cirrhosis, monitor cbc. Continue thiamine and folic acid  Thrombocytopenia With marrow suppression with his liver cirrhosis, no bleed reported, monitor clinically  Dysphagia SLP to follow, aspiration precautions, pureed food with nectar thick liquid for now  ckd stage 3 Monitor bmp  PAD Has arterial ulcer to right 5th toe and left 1st metatarsal, continue wound care  Gout No flare up, continue  allopurinol  gerd Stable,  Continue prilosec    Goals of care: short term rehabilitation   Labs/tests ordered: cbc, cmp 08/15/15   Family/ staff Communication: reviewed care plan with patient and nursing supervisor    Blanchie Serve, MD Internal Medicine [facility] [address] [place], [place] [address] Cell Phone (Monday-Friday 8 am - 5 pm): [phone] On Call: [phone] and follow prompts after 5 pm and on weekends Office Phone: [phone] Office Fax: [phone]

## 2015-08-15 LAB — HEPATIC FUNCTION PANEL
ALK PHOS: 210 U/L — AB (ref 25–125)
ALT: 26 U/L (ref 10–40)
AST: 56 U/L — AB (ref 14–40)
Bilirubin, Total: 0.9 mg/dL

## 2015-08-15 LAB — CBC AND DIFFERENTIAL
HCT: 25 % — AB (ref 41–53)
Hemoglobin: 8.4 g/dL — AB (ref 13.5–17.5)
Platelets: 131 10*3/uL — AB (ref 150–399)
WBC: 4.9 10^3/mL

## 2015-08-15 LAB — BASIC METABOLIC PANEL
BUN: 11 mg/dL (ref 4–21)
CREATININE: 1.5 mg/dL — AB (ref 0.6–1.3)
Glucose: 70 mg/dL
Potassium: 4.2 mmol/L (ref 3.4–5.3)
SODIUM: 134 mmol/L — AB (ref 137–147)

## 2015-08-17 ENCOUNTER — Encounter: Payer: Self-pay | Admitting: Family

## 2015-08-17 ENCOUNTER — Non-Acute Institutional Stay (SKILLED_NURSING_FACILITY): Payer: Medicare HMO | Admitting: Family

## 2015-08-17 DIAGNOSIS — D509 Iron deficiency anemia, unspecified: Secondary | ICD-10-CM | POA: Diagnosis not present

## 2015-08-17 DIAGNOSIS — E871 Hypo-osmolality and hyponatremia: Secondary | ICD-10-CM | POA: Diagnosis not present

## 2015-08-17 DIAGNOSIS — E46 Unspecified protein-calorie malnutrition: Secondary | ICD-10-CM

## 2015-08-17 NOTE — Progress Notes (Signed)
Location:  [facility] Room Number: 105 Place of Service:  SNF (31) Provider:  Marlowe Sax, FNP-C Lujean Amel, MD  Patient Care Team: Lujean Amel, MD as PCP - General (Family Medicine)  Extended Emergency Contact Information Primary Emergency Contact: Brazier,Geraldine Address: [address]          [place], [place] [address] Johnnette Litter of [place] Phone: [phone] Mobile Phone: [phone] Relation: Spouse  Code Status:  DNR Goals of care: Advanced Directive information Advanced Directives 08/17/2015  Does patient have an advance directive? Yes  Type of Advance Directive Out of facility DNR (pink MOST or yellow form)  Does patient want to make changes to advanced directive? No - Patient declined  Copy of advanced directive(s) in chart? Yes     Chief Complaint  Patient presents with  . Acute Visit    Follow up on Abnormal Labs    HPI:  Pt is a 70 y.o. male seen today at [facility] for an acute visit for abnormal labs. He has a medical history of HTN, GERD, liver cirrhosis, Esophagus CA,Hx of chemotherapy among others. He is seen in his room today. He states does not like thickened liquids. Has a swallow study with speech therapist today. His recent lab results Hgb 8.4, MCV 103.8,Total protein 5.1, Alb 2.69 (08/15/2015). He denies any acute issues. Facility staff reports no new concerns.    Past Medical History  Diagnosis Date  . Hypertension     under control  . History of cancer chemotherapy   . Hx of radiation therapy 2002  . Gout     under control  . Umbilical hernia   . Fatty liver     "under control, being monitored"  . Shortness of breath dyspnea   . GERD (gastroesophageal reflux disease)   . Alcoholic cirrhosis ([place])   . Esophageal cancer ([place]) 2002    "partial" esophagus removed  . Sleep apnea     "will not get tested" (08/04/2015)  . Arthritis     "neck" (08/04/2015)   Past Surgical History   Procedure Laterality Date  . Esophagectomy  2002    "partial"  . Incisional hernia repair  2004  . Umbilical hernia repair N/A 06/14/2014    Procedure:  OPEN UMBILICAL HERNIA REPAIR;  Surgeon: Pedro Earls, MD;  Location: [facility];  Service: General;  Laterality: N/A;  . Tonsillectomy    . Radiology with anesthesia N/A 02/09/2015    Procedure: TIPS;  Surgeon: Jacqulynn Cadet, MD;  Location: [facility];  Service: Radiology;  Laterality: N/A;  . Hernia repair      Allergies  Allergen Reactions  . Amoxicillin Diarrhea      Medication List       This list is accurate as of: 08/17/15  9:46 AM.  Always use your most recent med list.               allopurinol 300 MG tablet  Commonly known as:  ZYLOPRIM  Take 300 mg by mouth daily.     budesonide-formoterol 160-4.5 MCG/ACT inhaler  Commonly known as:  SYMBICORT  Inhale 2 puffs into the lungs 2 (two) times daily.     folic acid 1 MG tablet  Commonly known as:  FOLVITE  Take 1 tablet (1 mg total) by mouth daily.     ipratropium-albuterol 0.5-2.5 (3) MG/3ML Soln  Commonly known as:  DUONEB  Take 3 mLs by nebulization 2 (two) times daily.  lactulose 10 GM/15ML solution  Commonly known as:  CHRONULAC  Take 45 mLs (30 g total) by mouth 4 (four) times daily. Titrate for 3-4 bowel movements a day.     midodrine 5 MG tablet  Commonly known as:  PROAMATINE  Take 1 tablet (5 mg total) by mouth 3 (three) times daily with meals.     nystatin cream  Commonly known as:  MYCOSTATIN  Apply cream twice daily to buttocks for fungal rash.     omeprazole 20 MG capsule  Commonly known as:  PRILOSEC  Take 20 mg by mouth daily.     rifaximin 550 MG Tabs tablet  Commonly known as:  XIFAXAN  Take 1 tablet (550 mg total) by mouth 2 (two) times daily.     thiamine 100 MG tablet  Take 1 tablet (100 mg total) by mouth daily.        Review of Systems  Constitutional: Negative for fever, chills, activity change, appetite change and  fatigue.  HENT: Negative for congestion, rhinorrhea, sinus pressure, sneezing and sore throat.   Eyes: Negative.   Respiratory: Negative for cough, chest tightness, shortness of breath and wheezing.   Gastrointestinal: Negative for nausea, vomiting, abdominal pain, diarrhea, constipation and abdominal distention.  Endocrine: Negative.   Genitourinary: Negative for hematuria and flank pain.  Musculoskeletal: Positive for gait problem.  Skin: Negative.   Neurological: Negative for dizziness, syncope, light-headedness, numbness and headaches.  Psychiatric/Behavioral: Negative for hallucinations, confusion and agitation.    Immunization History  Administered Date(s) Administered  . PPD Test 08/10/2015  . Pneumococcal Polysaccharide-23 07/10/2015   Pertinent  Health Maintenance Due  Topic Date Due  . COLONOSCOPY  06/21/1993  . INFLUENZA VACCINE  11/15/2015  . PNA vac Low Risk Adult (2 of 2 - PCV13) 07/09/2016   No flowsheet data found. Functional Status Survey:    Filed Vitals:   08/17/15 0915  BP: 122/69  Pulse: 84  Temp: 97 F (36.1 C)  Resp: 18  Height: 6\' 2"  (1.88 m)  Weight: 211 lb (95.709 kg)  SpO2: 94%   Body mass index is 27.08 kg/(m^2). Physical Exam  Constitutional: He appears well-developed and well-nourished. No distress.  HENT:  Head: Normocephalic.  Mouth/Throat: Oropharynx is clear and moist.  Eyes: Conjunctivae and EOM are normal. Pupils are equal, round, and reactive to light. Right eye exhibits no discharge. Left eye exhibits no discharge. No scleral icterus.  Neck: Normal range of motion. No JVD present. No thyromegaly present.  Cardiovascular: Normal rate, regular rhythm, normal heart sounds and intact distal pulses.  Exam reveals no gallop and no friction rub.   No murmur heard. Pulmonary/Chest: Effort normal and breath sounds normal. No respiratory distress. He has no wheezes. He has no rales.  Abdominal: Soft. Bowel sounds are normal. He exhibits no  distension. There is no tenderness. There is no rebound and no guarding.  Musculoskeletal: Normal range of motion. He exhibits no edema or tenderness.  Lymphadenopathy:    He has no cervical adenopathy.  Neurological: He is alert.  Skin: Skin is warm and dry. No rash noted. No erythema. There is pallor.  Psychiatric: He has a normal mood and affect.    Labs reviewed:  Recent Labs  07/05/15 2036  07/07/15 0455  07/09/15 0231  08/05/15 0133  08/08/15 0325 08/08/15 1701 08/09/15 0623 08/15/15  NA 131*  < > 135  < > 138  < > 161*  < > 142 140 136 134*  K 3.9  < >  3.0*  < > 3.0*  < > 3.4*  < > 3.9 3.7 3.8 4.2  CL 97*  < > 103  < > 108  < > 128*  < > 115* 115* 110  --   CO2 25  < > 21*  < > 20*  < > 21*  < > 17* 18* 18*  --   GLUCOSE 114*  < > 80  < > 89  < > 103*  < > 87 82 81  --   BUN 18  < > 14  < > 10  < > 20  < > 11 11 8 11   CREATININE 1.62*  < > 1.66*  < > 1.39*  < > 2.68*  < > 2.12* 2.00* 1.79* 1.5*  CALCIUM 8.3*  < > 7.5*  < > 7.9*  < > 9.2  < > 8.2* 8.5* 8.5*  --   MG 1.3*  --  1.6*  --  1.8  --  1.7  --   --   --   --   --   PHOS 3.5  --  2.1*  --  2.3*  --   --   --   --   --   --   --   < > = values in this interval not displayed.  Recent Labs  07/13/15 0420 07/16/15 0308 08/04/15 1042 08/15/15  AST 48* 80* 41 56*  ALT 32 50 30 26  ALKPHOS 98 147* 157* 210*  BILITOT 0.9 1.7* 1.8*  --   PROT 6.2* 7.0 7.4  --   ALBUMIN 3.0* 3.5 3.5  --     Recent Labs  07/21/15 0446 08/04/15 1042 08/05/15 0529 08/15/15  WBC 4.1 5.4 5.6 4.9  NEUTROABS  --  3.4  --   --   HGB 9.4* 11.4* 10.3* 8.4*  HCT 27.3* 34.6* 31.2* 25*  MCV 99.3 102.4* 101.6*  --   PLT 111* 104* 102* 131*   No results found for: TSH No results found for: HGBA1C Lab Results  Component Value Date   TRIG 54 07/05/2015    Significant Diagnostic Results in last 30 days:  Dg Chest 2 View  07/20/2015  CLINICAL DATA:  Shortness of breath, weakness, pneumonia EXAM: CHEST  2 VIEW COMPARISON:  07/18/2015  FINDINGS: Cardiomegaly again noted. Limited study by poor inspiration. Central mild vascular congestion and mild infrahilar interstitial prominence suspicious for minimal interstitial edema. Hazy left basilar atelectasis or infiltrate. IMPRESSION: Central mild vascular congestion and mild infrahilar interstitial prominence suspicious for minimal interstitial edema. Hazy left basilar atelectasis or infiltrate. Electronically Signed   By: Lahoma Crocker M.D.   On: 07/20/2015 11:48   Dg Chest Port 1 View  08/07/2015  CLINICAL DATA:  Aspiration into respiratory tract, initial encounter EXAM: PORTABLE CHEST 1 VIEW COMPARISON:  08/04/2015 FINDINGS: Stable enlarged cardiac silhouette. Mild increase in LEFT basilar atelectasis. No pulmonary edema or infiltrate. No pneumothorax IMPRESSION: Cardiomegaly and LEFT basilar atelectasis. Electronically Signed   By: Suzy Bouchard M.D.   On: 08/07/2015 09:29   Dg Chest Port 1 View  08/04/2015  CLINICAL DATA:  Hypoxia EXAM: PORTABLE CHEST 1 VIEW COMPARISON:  07/20/2015 FINDINGS: There are low lung volumes. There is no evidence of no pneumonia or of residual interstitial edema. Mild medial lung base opacity is noted consistent with atelectasis. No convincing pleural effusion. No pneumothorax. Cardiac silhouette is normal in size. No mediastinal or hilar masses or convincing adenopathy. IMPRESSION: 1. Improved lung aeration  since the prior study. No convincing pneumonia or edema. Electronically Signed   By: Lajean Manes M.D.   On: 08/04/2015 11:12   Dg Swallowing Func-speech Pathology  08/07/2015  Objective Swallowing Evaluation: Type of Study: MBS-Modified Barium Swallow Study Patient Details Name: Mylin Stolar MRN: [id number] Date of Birth: 1946-01-04 Today's Date: 08/07/2015 Time: SLP Start Time (ACUTE ONLY): 1245-SLP Stop Time (ACUTE ONLY): 1315 SLP Time Calculation (min) (ACUTE ONLY): 30 min Past Medical History: Past Medical History Diagnosis Date . Hypertension    under  control . History of cancer chemotherapy  . Hx of radiation therapy 2002 . Gout    under control . Umbilical hernia  . Fatty liver    "under control, being monitored" . Shortness of breath dyspnea  . GERD (gastroesophageal reflux disease)  . Alcoholic cirrhosis ([place])  . Esophageal cancer ([place]) 2002   "partial" esophagus removed . Sleep apnea    "will not get tested" (08/04/2015) . Arthritis    "neck" (08/04/2015) Past Surgical History: Past Surgical History Procedure Laterality Date . Esophagectomy  2002   "partial" . Incisional hernia repair  2004 . Umbilical hernia repair N/A 06/14/2014   Procedure:  OPEN UMBILICAL HERNIA REPAIR;  Surgeon: Pedro Earls, MD;  Location: [facility];  Service: General;  Laterality: N/A; . Tonsillectomy   . Radiology with anesthesia N/A 02/09/2015   Procedure: TIPS;  Surgeon: Jacqulynn Cadet, MD;  Location: [facility];  Service: Radiology;  Laterality: N/A; . Hernia repair   HPI: Pt is a 70 y.o. male presented to ED 4/20 presenting with hepatic encephalopathy. 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 Pt was seen yesterdasy 4/22 for a bedside swallow eval with recommendations for pt to be NPO given lethargy/ decreased awareness of bolus. Pt reportedly improving today 4/23, SLP eval re-ordered as there is consideration now of enteral feedings or NG. Subjective: Pt sitting up in chair at bedside and able to recall details of this hospitalization. Assessment / Plan / Recommendation CHL IP CLINICAL IMPRESSIONS 08/07/2015 Therapy Diagnosis Moderate oral phase dysphagia;Moderate pharyngeal phase dysphagia Clinical Impression Pt currently demonstrating a moderate oropharyngeal dysphagia which appears to be cognitive-based. Pt had a delay in swallow initiation to the level of the valleculae or pyriform sinuses across consistencies, resulting in frank silent aspiration of thin liquids and trace penetration of  nectar-thick liquids. No penetration/ aspiration of honey-thick or puree consistencies noted. However, the pt did have mild-moderate amounts of residuals in the valleculae post-swallow resulting from reduced tongue base retraction and needed max cues to swallow a 2nd time. Most of the residuals were cleared with a 2nd swallow. Pt was easily distracted during the evaluation which appeared to be a contributing factor in swallow function observed. Pt is at high risk of aspiration of thin and nectar-thick liquids. Recommend initiating diet of dysphagia 1, honey-thick liquids, meds crushed in puree or via alternative means if cannot crush. Full supervision during meals to cue for small bites/ sips, cue pt to swallow 2 times with each bite/ sip, minimize distractions. Will continue to follow for diet tolerance/ consider advancement. Impact on safety and function Moderate aspiration risk;Severe aspiration risk   CHL IP TREATMENT RECOMMENDATION 08/07/2015 Treatment Recommendations Therapy as outlined in treatment plan below   Prognosis 08/07/2015 Prognosis for Safe Diet Advancement Good Barriers to Reach Goals Cognitive deficits Barriers/Prognosis Comment -- CHL IP DIET RECOMMENDATION 08/07/2015 SLP Diet Recommendations Dysphagia 1 (Puree) solids;Honey thick liquids Liquid  Administration via Spoon;Cup Medication Administration Crushed with puree Compensations Minimize environmental distractions;Slow rate;Small sips/bites;Multiple dry swallows after each bite/sip Postural Changes Remain semi-upright after after feeds/meals (Comment);Seated upright at 90 degrees   CHL IP OTHER RECOMMENDATIONS 08/07/2015 Recommended Consults -- Oral Care Recommendations Oral care BID Other Recommendations Have oral suction available;Clarify dietary restrictions   CHL IP FOLLOW UP RECOMMENDATIONS 08/07/2015 Follow up Recommendations 24 hour supervision/assistance   CHL IP FREQUENCY AND DURATION 08/07/2015 Speech Therapy Frequency (ACUTE ONLY) min  2x/week Treatment Duration 2 weeks      CHL IP ORAL PHASE 08/07/2015 Oral Phase Impaired Oral - Pudding Teaspoon -- Oral - Pudding Cup -- Oral - Honey Teaspoon -- Oral - Honey Cup Lingual/palatal residue;Delayed oral transit;Piecemeal swallowing;Reduced posterior propulsion Oral - Nectar Teaspoon -- Oral - Nectar Cup Reduced posterior propulsion;Lingual/palatal residue;Piecemeal swallowing;Delayed oral transit Oral - Nectar Straw -- Oral - Thin Teaspoon -- Oral - Thin Cup Reduced posterior propulsion;Delayed oral transit Oral - Thin Straw -- Oral - Puree Reduced posterior propulsion;Lingual/palatal residue;Piecemeal swallowing;Delayed oral transit Oral - Mech Soft -- Oral - Regular -- Oral - Multi-Consistency -- Oral - Pill -- Oral Phase - Comment --  CHL IP PHARYNGEAL PHASE 08/07/2015 Pharyngeal Phase Impaired Pharyngeal- Pudding Teaspoon -- Pharyngeal -- Pharyngeal- Pudding Cup -- Pharyngeal -- Pharyngeal- Honey Teaspoon -- Pharyngeal -- Pharyngeal- Honey Cup Delayed swallow initiation-vallecula;Reduced tongue base retraction;Pharyngeal residue - valleculae Pharyngeal -- Pharyngeal- Nectar Teaspoon -- Pharyngeal -- Pharyngeal- Nectar Cup Delayed swallow initiation-vallecula;Reduced tongue base retraction;Pharyngeal residue - valleculae Pharyngeal -- Pharyngeal- Nectar Straw -- Pharyngeal -- Pharyngeal- Thin Teaspoon -- Pharyngeal -- Pharyngeal- Thin Cup Delayed swallow initiation-pyriform sinuses;Reduced tongue base retraction;Penetration/Aspiration before swallow;Moderate aspiration;Pharyngeal residue - valleculae;Pharyngeal residue - pyriform Pharyngeal Material enters airway, passes BELOW cords without attempt by patient to eject out (silent aspiration) Pharyngeal- Thin Straw -- Pharyngeal -- Pharyngeal- Puree Delayed swallow initiation-vallecula;Reduced tongue base retraction;Pharyngeal residue - valleculae Pharyngeal -- Pharyngeal- Mechanical Soft -- Pharyngeal -- Pharyngeal- Regular -- Pharyngeal --  Pharyngeal- Multi-consistency -- Pharyngeal -- Pharyngeal- Pill -- Pharyngeal -- Pharyngeal Comment --  CHL IP CERVICAL ESOPHAGEAL PHASE 08/07/2015 Cervical Esophageal Phase WFL Pudding Teaspoon -- Pudding Cup -- Honey Teaspoon -- Honey Cup -- Nectar Teaspoon -- Nectar Cup -- Nectar Straw -- Thin Teaspoon -- Thin Cup -- Thin Straw -- Puree -- Mechanical Soft -- Regular -- Multi-consistency -- Pill -- Cervical Esophageal Comment -- CHL IP GO 07/13/2015 Functional Assessment Tool Used skilled clinical judgment Functional Limitations Swallowing Swallow Current Status [id number]) CI Swallow Goal Status [id number]) [place] Swallow Discharge Status [id number]) (None) Motor Speech Current Status [id number]) (None) Motor Speech Goal Status [id number]) (None) Motor Speech Goal Status [id number]) (None) Spoken Language Comprehension Current Status [id number]) (None) Spoken Language Comprehension Goal Status [id number]) (None) Spoken Language Comprehension Discharge Status [phone]) (None) Spoken Language Expression Current Status [id number]) (None) Spoken Language Expression Goal Status [id number]) (None) Spoken Language Expression Discharge Status [phone]) (None) Attention Current Status [id number]) (None) Attention Goal Status [id number]) (None) Attention Discharge Status [id number]) (None) Memory Current Status [id number]:130515) (None) Memory Goal Status [id number]) (None) Memory Discharge Status [id number]) (None) Voice Current Status [id number]) (None) Voice Goal Status [id number]) (None) Voice Discharge Status [id number]) (None) Other Speech-Language Pathology Functional Limitation [phone]) (None) Other Speech-Language Pathology Functional Limitation Goal Status [id number]) (None) Other Speech-Language Pathology Functional Limitation Discharge Status [phone]) (None) Kern Reap, MA, CCC-SLP 08/07/2015, 1:48 PM [phone]              Assessment/Plan Anemia Recent Hgb 8.4, MCV 103.8 (08/15/2015). Add Ferrous sulfate  325 mg Tablet daily. Continue Folic Acid 1 mg Tablet. Obtain Vitamin B12 level 08/18/2015  Protein  Malnutrition  Total protein 5.1, Alb 2.69 (08/15/2015) RD consult.   Hyponatremia  Na+ 134 (08/15/2015). Monitor BMP    Family/ staff Communication: Reviewed plan of Care with patient and facility Nurse supervisor.   Labs/tests ordered: Vitamin B12 level 08/18/2015

## 2015-08-22 ENCOUNTER — Encounter: Payer: Self-pay | Admitting: Family

## 2015-08-22 ENCOUNTER — Encounter (HOSPITAL_COMMUNITY): Payer: Self-pay | Admitting: Emergency Medicine

## 2015-08-22 ENCOUNTER — Inpatient Hospital Stay (HOSPITAL_COMMUNITY)
Admission: EM | Admit: 2015-08-22 | Discharge: 2015-09-15 | DRG: 432 | Disposition: E | Payer: Medicare HMO | Attending: Family Medicine | Admitting: Family Medicine

## 2015-08-22 ENCOUNTER — Non-Acute Institutional Stay (SKILLED_NURSING_FACILITY): Payer: Medicare HMO | Admitting: Family

## 2015-08-22 DIAGNOSIS — M109 Gout, unspecified: Secondary | ICD-10-CM | POA: Diagnosis present

## 2015-08-22 DIAGNOSIS — R040 Epistaxis: Secondary | ICD-10-CM

## 2015-08-22 DIAGNOSIS — Z923 Personal history of irradiation: Secondary | ICD-10-CM

## 2015-08-22 DIAGNOSIS — K760 Fatty (change of) liver, not elsewhere classified: Secondary | ICD-10-CM | POA: Diagnosis present

## 2015-08-22 DIAGNOSIS — J9620 Acute and chronic respiratory failure, unspecified whether with hypoxia or hypercapnia: Secondary | ICD-10-CM | POA: Diagnosis present

## 2015-08-22 DIAGNOSIS — Z9981 Dependence on supplemental oxygen: Secondary | ICD-10-CM | POA: Diagnosis not present

## 2015-08-22 DIAGNOSIS — D539 Nutritional anemia, unspecified: Secondary | ICD-10-CM | POA: Diagnosis present

## 2015-08-22 DIAGNOSIS — D696 Thrombocytopenia, unspecified: Secondary | ICD-10-CM | POA: Diagnosis not present

## 2015-08-22 DIAGNOSIS — F05 Delirium due to known physiological condition: Secondary | ICD-10-CM

## 2015-08-22 DIAGNOSIS — Z515 Encounter for palliative care: Secondary | ICD-10-CM

## 2015-08-22 DIAGNOSIS — Z87891 Personal history of nicotine dependence: Secondary | ICD-10-CM | POA: Diagnosis not present

## 2015-08-22 DIAGNOSIS — Z9221 Personal history of antineoplastic chemotherapy: Secondary | ICD-10-CM

## 2015-08-22 DIAGNOSIS — R0600 Dyspnea, unspecified: Secondary | ICD-10-CM

## 2015-08-22 DIAGNOSIS — K7682 Hepatic encephalopathy: Secondary | ICD-10-CM

## 2015-08-22 DIAGNOSIS — K7040 Alcoholic hepatic failure without coma: Secondary | ICD-10-CM | POA: Diagnosis present

## 2015-08-22 DIAGNOSIS — K7290 Hepatic failure, unspecified without coma: Secondary | ICD-10-CM

## 2015-08-22 DIAGNOSIS — Z79899 Other long term (current) drug therapy: Secondary | ICD-10-CM | POA: Diagnosis not present

## 2015-08-22 DIAGNOSIS — Z66 Do not resuscitate: Secondary | ICD-10-CM | POA: Diagnosis present

## 2015-08-22 DIAGNOSIS — T17908A Unspecified foreign body in respiratory tract, part unspecified causing other injury, initial encounter: Secondary | ICD-10-CM | POA: Insufficient documentation

## 2015-08-22 DIAGNOSIS — K767 Hepatorenal syndrome: Secondary | ICD-10-CM | POA: Diagnosis not present

## 2015-08-22 DIAGNOSIS — I129 Hypertensive chronic kidney disease with stage 1 through stage 4 chronic kidney disease, or unspecified chronic kidney disease: Secondary | ICD-10-CM | POA: Diagnosis present

## 2015-08-22 DIAGNOSIS — Z881 Allergy status to other antibiotic agents status: Secondary | ICD-10-CM | POA: Diagnosis not present

## 2015-08-22 DIAGNOSIS — I959 Hypotension, unspecified: Secondary | ICD-10-CM | POA: Diagnosis not present

## 2015-08-22 DIAGNOSIS — Z833 Family history of diabetes mellitus: Secondary | ICD-10-CM

## 2015-08-22 DIAGNOSIS — E035 Myxedema coma: Secondary | ICD-10-CM | POA: Diagnosis not present

## 2015-08-22 DIAGNOSIS — K7031 Alcoholic cirrhosis of liver with ascites: Secondary | ICD-10-CM | POA: Diagnosis present

## 2015-08-22 DIAGNOSIS — Z9049 Acquired absence of other specified parts of digestive tract: Secondary | ICD-10-CM | POA: Diagnosis not present

## 2015-08-22 DIAGNOSIS — K219 Gastro-esophageal reflux disease without esophagitis: Secondary | ICD-10-CM | POA: Diagnosis present

## 2015-08-22 DIAGNOSIS — E038 Other specified hypothyroidism: Secondary | ICD-10-CM | POA: Insufficient documentation

## 2015-08-22 DIAGNOSIS — N183 Chronic kidney disease, stage 3 (moderate): Secondary | ICD-10-CM | POA: Diagnosis present

## 2015-08-22 DIAGNOSIS — Z95828 Presence of other vascular implants and grafts: Secondary | ICD-10-CM

## 2015-08-22 DIAGNOSIS — R451 Restlessness and agitation: Secondary | ICD-10-CM | POA: Diagnosis not present

## 2015-08-22 DIAGNOSIS — Z7951 Long term (current) use of inhaled steroids: Secondary | ICD-10-CM | POA: Diagnosis not present

## 2015-08-22 DIAGNOSIS — Z8501 Personal history of malignant neoplasm of esophagus: Secondary | ICD-10-CM | POA: Diagnosis not present

## 2015-08-22 DIAGNOSIS — G4730 Sleep apnea, unspecified: Secondary | ICD-10-CM | POA: Diagnosis present

## 2015-08-22 DIAGNOSIS — T17998D Other foreign object in respiratory tract, part unspecified causing other injury, subsequent encounter: Secondary | ICD-10-CM | POA: Diagnosis not present

## 2015-08-22 DIAGNOSIS — R4182 Altered mental status, unspecified: Secondary | ICD-10-CM | POA: Diagnosis present

## 2015-08-22 LAB — COMPREHENSIVE METABOLIC PANEL
ALBUMIN: 2.9 g/dL — AB (ref 3.5–5.0)
ALK PHOS: 186 U/L — AB (ref 38–126)
ALT: 31 U/L (ref 17–63)
AST: 69 U/L — AB (ref 15–41)
Anion gap: 13 (ref 5–15)
BILIRUBIN TOTAL: 1.3 mg/dL — AB (ref 0.3–1.2)
BUN: 13 mg/dL (ref 6–20)
CALCIUM: 9.6 mg/dL (ref 8.9–10.3)
CO2: 21 mmol/L — ABNORMAL LOW (ref 22–32)
CREATININE: 1.62 mg/dL — AB (ref 0.61–1.24)
Chloride: 100 mmol/L — ABNORMAL LOW (ref 101–111)
GFR calc Af Amer: 47 mL/min — ABNORMAL LOW (ref >60)
GFR, EST NON AFRICAN AMERICAN: 41 mL/min — AB (ref >60)
GLUCOSE: 107 mg/dL — AB (ref 65–99)
POTASSIUM: 3.9 mmol/L (ref 3.5–5.1)
Sodium: 134 mmol/L — ABNORMAL LOW (ref 135–145)
TOTAL PROTEIN: 6.2 g/dL — AB (ref 6.5–8.1)

## 2015-08-22 LAB — CBC WITH DIFFERENTIAL/PLATELET
BASOS ABS: 0 10*3/uL (ref 0.0–0.1)
BASOS PCT: 1 %
EOS ABS: 0 10*3/uL (ref 0.0–0.7)
EOS PCT: 0 %
HCT: 23.9 % — ABNORMAL LOW (ref 39.0–52.0)
Hemoglobin: 8.5 g/dL — ABNORMAL LOW (ref 13.0–17.0)
LYMPHS PCT: 14 %
Lymphs Abs: 0.7 10*3/uL (ref 0.7–4.0)
MCH: 36.2 pg — ABNORMAL HIGH (ref 26.0–34.0)
MCHC: 35.6 g/dL (ref 30.0–36.0)
MCV: 101.7 fL — AB (ref 78.0–100.0)
MONO ABS: 0.4 10*3/uL (ref 0.1–1.0)
Monocytes Relative: 7 %
Neutro Abs: 4.3 10*3/uL (ref 1.7–7.7)
Neutrophils Relative %: 78 %
PLATELETS: 177 10*3/uL (ref 150–400)
RBC: 2.35 MIL/uL — AB (ref 4.22–5.81)
RDW: 16.7 % — AB (ref 11.5–15.5)
WBC: 5.4 10*3/uL (ref 4.0–10.5)

## 2015-08-22 LAB — POC OCCULT BLOOD, ED: FECAL OCCULT BLD: NEGATIVE

## 2015-08-22 LAB — PROTIME-INR
INR: 1.08 (ref 0.00–1.49)
PROTHROMBIN TIME: 14.2 s (ref 11.6–15.2)

## 2015-08-22 LAB — AMMONIA: Ammonia: 123 umol/L — ABNORMAL HIGH (ref 9–35)

## 2015-08-22 MED ORDER — LACTULOSE 10 GM/15ML PO SOLN
30.0000 g | Freq: Once | ORAL | Status: AC
  Administered 2015-08-22: 30 g via ORAL
  Filled 2015-08-22: qty 45

## 2015-08-22 NOTE — ED Notes (Signed)
Personal belongings sent home with wife. HA in left ear

## 2015-08-22 NOTE — ED Notes (Signed)
Per [facility]   AMS since 8 am this am. Hx of encephalopathy 2 nose bleeds this am   Some slurred speech since this am. No deficits on either side.  MD at facility is worried about ammonia level.   Stroke screen neg   CBG 125

## 2015-08-22 NOTE — Progress Notes (Signed)
Location:  [facility] Room Number: 105 Place of Service:  SNF (31) Provider:  Marlowe Sax, FNP-C  Lujean Amel, MD  Patient Care Team: Lujean Amel, MD as PCP - General (Family Medicine)  Extended Emergency Contact Information Primary Emergency Contact: Netto,Geraldine Address: [address]          [place], [place] [address] Johnnette Litter of [place] Phone: [phone] Mobile Phone: [phone] Relation: Spouse  Code Status:  DNR Goals of care: Advanced Directive information Advanced Directives 08/16/2015  Does patient have an advance directive? Yes  Type of Advance Directive Out of facility DNR (pink MOST or yellow form)  Does patient want to make changes to advanced directive? No - Patient declined  Copy of advanced directive(s) in chart? Yes     Chief Complaint  Patient presents with  . Acute Visit    Follow up on Abnormal Bleeding    HPI:  Pt is a 70 y.o. male seen today at [facility] for an acute visit for evaluation of nose bleeding. He has a significant medical history of Thrombocytopenia, Cirrohosis of the liver with Ascites, esophageal CA S/p FEES 08/17/2015 larygopharygeal reflux ,erythema and edema. He is seen in his room today. He denies any fever, chills or cough.Facility staff also reports some confusion.    Past Medical History  Diagnosis Date  . Hypertension     under control  . History of cancer chemotherapy   . Hx of radiation therapy 2002  . Gout     under control  . Umbilical hernia   . Fatty liver     "under control, being monitored"  . Shortness of breath dyspnea   . GERD (gastroesophageal reflux disease)   . Alcoholic cirrhosis ([place])   . Esophageal cancer ([place]) 2002    "partial" esophagus removed  . Sleep apnea     "will not get tested" (08/04/2015)  . Arthritis     "neck" (08/04/2015)   Past Surgical History  Procedure Laterality Date  . Esophagectomy  2002    "partial"  .  Incisional hernia repair  2004  . Umbilical hernia repair N/A 06/14/2014    Procedure:  OPEN UMBILICAL HERNIA REPAIR;  Surgeon: Pedro Earls, MD;  Location: [facility];  Service: General;  Laterality: N/A;  . Tonsillectomy    . Radiology with anesthesia N/A 02/09/2015    Procedure: TIPS;  Surgeon: Jacqulynn Cadet, MD;  Location: [facility];  Service: Radiology;  Laterality: N/A;  . Hernia repair      Allergies  Allergen Reactions  . Amoxicillin Diarrhea      Medication List       This list is accurate as of: 09/03/2015  9:35 AM.  Always use your most recent med list.               allopurinol 300 MG tablet  Commonly known as:  ZYLOPRIM  Take 300 mg by mouth daily.     budesonide-formoterol 160-4.5 MCG/ACT inhaler  Commonly known as:  SYMBICORT  Inhale 2 puffs into the lungs 2 (two) times daily.     ferrous sulfate 325 (65 FE) MG EC tablet  Take 325 mg by mouth daily.     folic acid 1 MG tablet  Commonly known as:  FOLVITE  Take 1 tablet (1 mg total) by mouth daily.     ipratropium-albuterol 0.5-2.5 (3) MG/3ML Soln  Commonly known as:  DUONEB  Take 3 mLs by nebulization 2 (two)  times daily.     lactulose 10 GM/15ML solution  Commonly known as:  CHRONULAC  Take 45 mLs (30 g total) by mouth 4 (four) times daily. Titrate for 3-4 bowel movements a day.     midodrine 5 MG tablet  Commonly known as:  PROAMATINE  Take 1 tablet (5 mg total) by mouth 3 (three) times daily with meals.     nystatin cream  Commonly known as:  MYCOSTATIN  Apply cream twice daily to buttocks for fungal rash.     omeprazole 20 MG capsule  Commonly known as:  PRILOSEC  Take 20 mg by mouth daily.     OXYGEN  Inhale 2 L into the lungs.     rifaximin 550 MG Tabs tablet  Commonly known as:  XIFAXAN  Take 1 tablet (550 mg total) by mouth 2 (two) times daily.     thiamine 100 MG tablet  Take 1 tablet (100 mg total) by mouth daily.        Review of Systems  Constitutional: Negative for  activity change, appetite change, chills, fatigue and fever.  HENT: Positive for nosebleeds. Negative for congestion, rhinorrhea, sinus pressure, sneezing and sore throat.   Eyes: Negative.   Respiratory: Negative for cough, chest tightness, shortness of breath and wheezing.   Cardiovascular: Negative for chest pain, palpitations and leg swelling.  Gastrointestinal: Negative for abdominal distention, abdominal pain, constipation, diarrhea, nausea and vomiting.  Genitourinary: Negative for flank pain and hematuria.  Musculoskeletal: Positive for gait problem.  Skin: Negative.   Neurological: Negative for dizziness, syncope, light-headedness, numbness and headaches.  Hematological: Does not bruise/bleed easily.  Psychiatric/Behavioral: Negative for agitation, confusion and hallucinations.    Immunization History  Administered Date(s) Administered  . PPD Test 08/10/2015  . Pneumococcal Polysaccharide-23 07/10/2015   Pertinent  Health Maintenance Due  Topic Date Due  . COLONOSCOPY  06/21/1993  . INFLUENZA VACCINE  11/15/2015  . PNA vac Low Risk Adult (2 of 2 - PCV13) 07/09/2016      Filed Vitals:   09/01/2015 0914  BP: 109/66  Pulse: 74  Temp: 98 F (36.7 C)  Resp: 18  Height: 6\' 2"  (1.88 m)  Weight: 208 lb (94.348 kg)  SpO2: 99%   Body mass index is 26.69 kg/(m^2). Physical Exam  Constitutional: He appears well-developed and well-nourished. No distress.  HENT:  Head: Normocephalic.  Mouth/Throat: Oropharynx is clear and moist.  Nose bleed   Eyes: Conjunctivae and EOM are normal. Pupils are equal, round, and reactive to light. Right eye exhibits no discharge. Left eye exhibits no discharge. No scleral icterus.  Neck: Normal range of motion. No JVD present. No thyromegaly present.  Cardiovascular: Normal rate, regular rhythm, normal heart sounds and intact distal pulses.  Exam reveals no gallop and no friction rub.   No murmur heard. Pulmonary/Chest: Effort normal and breath  sounds normal. No respiratory distress. He has no wheezes. He has no rales.  Abdominal: Soft. Bowel sounds are normal. He exhibits no distension. There is no tenderness. There is no rebound and no guarding.  Lymphadenopathy:    He has no cervical adenopathy.  Neurological: He is alert.  Skin: Skin is warm and dry. No rash noted. No erythema. There is pallor.  Psychiatric: He has a normal mood and affect.    Labs reviewed:  Recent Labs  07/05/15 2036  07/07/15 0455  07/09/15 0231  08/05/15 0133  08/08/15 0325 08/08/15 1701 08/09/15 0623 08/15/15  NA 131*  < > 135  < >  138  < > 161*  < > 142 140 136 134*  K 3.9  < > 3.0*  < > 3.0*  < > 3.4*  < > 3.9 3.7 3.8 4.2  CL 97*  < > 103  < > 108  < > 128*  < > 115* 115* 110  --   CO2 25  < > 21*  < > 20*  < > 21*  < > 17* 18* 18*  --   GLUCOSE 114*  < > 80  < > 89  < > 103*  < > 87 82 81  --   BUN 18  < > 14  < > 10  < > 20  < > 11 11 8 11   CREATININE 1.62*  < > 1.66*  < > 1.39*  < > 2.68*  < > 2.12* 2.00* 1.79* 1.5*  CALCIUM 8.3*  < > 7.5*  < > 7.9*  < > 9.2  < > 8.2* 8.5* 8.5*  --   MG 1.3*  --  1.6*  --  1.8  --  1.7  --   --   --   --   --   PHOS 3.5  --  2.1*  --  2.3*  --   --   --   --   --   --   --   < > = values in this interval not displayed.  Recent Labs  07/13/15 0420 07/16/15 0308 08/04/15 1042 08/15/15  AST 48* 80* 41 56*  ALT 32 50 30 26  ALKPHOS 98 147* 157* 210*  BILITOT 0.9 1.7* 1.8*  --   PROT 6.2* 7.0 7.4  --   ALBUMIN 3.0* 3.5 3.5  --     Recent Labs  07/21/15 0446 08/04/15 1042 08/05/15 0529 08/15/15  WBC 4.1 5.4 5.6 4.9  NEUTROABS  --  3.4  --   --   HGB 9.4* 11.4* 10.3* 8.4*  HCT 27.3* 34.6* 31.2* 25*  MCV 99.3 102.4* 101.6*  --   PLT 111* 104* 102* 131*   Lab Results  Component Value Date   TRIG 54 07/05/2015   Assessment/Plan Nose bleed  Had nose bleed prior to visit but has stopped. Hx of esophageal Ca S/p FEES 08/17/2015 larygopharygeal reflux ,erythema and edema.stat  CBC/diff  Thrombocytopenia  Recent PLts 131 ( 08/15/2015) previous 102  Will obtain stat CBC/diff. will send to ER for evaluation given hx of Esophageal Cancer and chemotherapy treatment if PLTs continue to be low   Confusion  Afebrile. Will obtain stat CBC/diff, BMP and Ammonia level. Continue on Lactulose.   Reflux  S/p FEES 08/17/2015 larygopharygeal reflux ,erythema and edema.Omeprazole 20 mg daily.     Family/ staff Communication: Reviewed plan of care with patient, Facility Nurse and Nurse supervisor.   Labs/tests ordered: stat CBC/diff, BMP and Ammonia level.

## 2015-08-22 NOTE — ED Provider Notes (Signed)
CSN: ID:[id number]     Arrival date & time 08/20/2015  2011 History   First MD Initiated Contact with Patient 08/30/2015 2017     Chief Complaint  Patient presents with  . Altered Mental Status     (Consider location/radiation/quality/duration/timing/severity/associated sxs/prior Treatment) HPI Comments: Patient with a history of alcoholic cirrhosis s/p TIPs (01/2015), esophageal cancer s/p esophagectomy (2002), HTN, GERD presents from [facility] where he is rehabilitating from recent hospitalization for confusion, likely hepatic encephalopathy, who became increasingly confused throughout today. No fever, no changes in medications, no falls or injuries. Per nursing home staff Aniceto Boss, RN) the patient has had decreasing number of stools in the last 2-3 days, down to one daily. He had reportedly been doing well until today when he became confused, disoriented and forgetful. Per his wife, he started this morning being minimally confused and, this evening, has stopped recognizing who she is.  Patient is a 70 y.o. male presenting with altered mental status. The history is provided by the patient, the spouse and the nursing home. No language interpreter was used.  Altered Mental Status Presenting symptoms: confusion and disorientation   Severity:  Severe Most recent episode:  Today   Past Medical History  Diagnosis Date  . Hypertension     under control  . History of cancer chemotherapy   . Hx of radiation therapy 2002  . Gout     under control  . Umbilical hernia   . Fatty liver     "under control, being monitored"  . Shortness of breath dyspnea   . GERD (gastroesophageal reflux disease)   . Alcoholic cirrhosis ([place])   . Esophageal cancer ([place]) 2002    "partial" esophagus removed  . Sleep apnea     "will not get tested" (08/04/2015)  . Arthritis     "neck" (08/04/2015)   Past Surgical History  Procedure Laterality Date  . Esophagectomy  2002    "partial"  . Incisional hernia repair   2004  . Umbilical hernia repair N/A 06/14/2014    Procedure:  OPEN UMBILICAL HERNIA REPAIR;  Surgeon: Pedro Earls, MD;  Location[facility];  Service: General;  Laterality: N/A;  . Tonsillectomy    . Radiology with anesthesia N/A 02/09/2015    Procedure: TIPS;  Surgeon: Jacqulynn Cadet, MD;  Location: [place];  Service: Radiology;  Laterality: N/A;  . Hernia repair     Family History  Problem Relation Age of Onset  . Diabetes Father   . Diabetes Brother    Social History  Substance Use Topics  . Smoking status: Former Smoker -- 1.50 packs/day for 40 years    Types: Cigarettes    Quit date: 04/16/2004  . Smokeless tobacco: Never Used  . Alcohol Use: 50.4 oz/week    84 Glasses of wine per week     Comment: 08/04/2015 "@ least 2 bottles of wire/day"    Review of Systems  Unable to perform ROS: Mental status change  Psychiatric/Behavioral: Positive for confusion.      Allergies  Amoxicillin  Home Medications   Prior to Admission medications   Medication Sig Start Date End Date Taking? Authorizing Provider  allopurinol (ZYLOPRIM) 300 MG tablet Take 300 mg by mouth daily.  12/20/12   Historical Provider, MD  budesonide-formoterol (SYMBICORT) 160-4.5 MCG/ACT inhaler Inhale 2 puffs into the lungs 2 (two) times daily.    Historical Provider, MD  ferrous sulfate 325 (65 FE) MG EC tablet Take 325 mg by mouth daily.  Historical Provider, MD  folic acid (FOLVITE) 1 MG tablet Take 1 tablet (1 mg total) by mouth daily. 07/22/15   Mercy Riding, MD  ipratropium-albuterol (DUONEB) 0.5-2.5 (3) MG/3ML SOLN Take 3 mLs by nebulization 2 (two) times daily. 07/22/15   Mercy Riding, MD  lactulose (CHRONULAC) 10 GM/15ML solution Take 45 mLs (30 g total) by mouth 4 (four) times daily. Titrate for 3-4 bowel movements a day. 08/09/15   Mercy Riding, MD  midodrine (PROAMATINE) 5 MG tablet Take 1 tablet (5 mg total) by mouth 3 (three) times daily with meals. 07/22/15   Mercy Riding, MD  nystatin cream  (MYCOSTATIN) Apply cream twice daily to buttocks for fungal rash.    Historical Provider, MD  omeprazole (PRILOSEC) 20 MG capsule Take 20 mg by mouth daily.  01/18/13   Historical Provider, MD  OXYGEN Inhale 2 L into the lungs.    Historical Provider, MD  rifaximin (XIFAXAN) 550 MG TABS tablet Take 1 tablet (550 mg total) by mouth 2 (two) times daily. 08/09/15   Mercy Riding, MD  thiamine 100 MG tablet Take 1 tablet (100 mg total) by mouth daily. 07/22/15   Mercy Riding, MD   BP 131/87 mmHg  Temp(Src) 97.6 F (36.4 C) (Oral)  Resp 14  SpO2 99% Physical Exam  Constitutional: He appears well-developed and well-nourished. No distress.  HENT:  Head: Normocephalic.  Eyes:  Conjunctival pallor.  Neck: Normal range of motion. Neck supple.  Cardiovascular: Normal rate and regular rhythm.   Pulmonary/Chest: Effort normal. He has no wheezes. He has rales.  Mild, diffuse rales bilaterally.  Abdominal: Soft. Bowel sounds are normal. There is no tenderness. There is no rebound and no guarding.  Protuberant abdomen, non-tender.    Musculoskeletal: Normal range of motion.  Neurological: He is alert. Coordination normal.  The patient is disoriented to person, place and time. He believes he is [place] and is unaware he is in the hospital. Follows commands. Pleasant demeanor, cooperative.   Skin: Skin is warm and dry. No rash noted.  Psychiatric: He has a normal mood and affect.    ED Course  Procedures (including critical care time) Labs Review Labs Reviewed  CBC WITH DIFFERENTIAL/PLATELET - Abnormal; Notable for the following:    RBC 2.35 (*)    Hemoglobin 8.5 (*)    HCT 23.9 (*)    MCV 101.7 (*)    MCH 36.2 (*)    RDW 16.7 (*)    All other components within normal limits  PROTIME-INR  COMPREHENSIVE METABOLIC PANEL  AMMONIA  POC OCCULT BLOOD, ED   Results for orders placed or performed during the hospital encounter of 09/05/2015  Comprehensive metabolic panel  Result Value Ref Range    Sodium 134 (L) 135 - 145 mmol/L   Potassium 3.9 3.5 - 5.1 mmol/L   Chloride 100 (L) 101 - 111 mmol/L   CO2 21 (L) 22 - 32 mmol/L   Glucose, Bld 107 (H) 65 - 99 mg/dL   BUN 13 6 - 20 mg/dL   Creatinine, Ser 1.62 (H) 0.61 - 1.24 mg/dL   Calcium 9.6 8.9 - 10.3 mg/dL   Total Protein 6.2 (L) 6.5 - 8.1 g/dL   Albumin 2.9 (L) 3.5 - 5.0 g/dL   AST 69 (H) 15 - 41 U/L   ALT 31 17 - 63 U/L   Alkaline Phosphatase 186 (H) 38 - 126 U/L   Total Bilirubin 1.3 (H) 0.3 - 1.2 mg/dL  GFR calc non Af Amer 41 (L) >60 mL/min   GFR calc Af Amer 47 (L) >60 mL/min   Anion gap 13 5 - 15  CBC with Differential  Result Value Ref Range   WBC 5.4 4.0 - 10.5 K/uL   RBC 2.35 (L) 4.22 - 5.81 MIL/uL   Hemoglobin 8.5 (L) 13.0 - 17.0 g/dL   HCT 23.9 (L) 39.0 - 52.0 %   MCV 101.7 (H) 78.0 - 100.0 fL   MCH 36.2 (H) 26.0 - 34.0 pg   MCHC 35.6 30.0 - 36.0 g/dL   RDW 16.7 (H) 11.5 - 15.5 %   Platelets 177 150 - 400 K/uL   Neutrophils Relative % 78 %   Neutro Abs 4.3 1.7 - 7.7 K/uL   Lymphocytes Relative 14 %   Lymphs Abs 0.7 0.7 - 4.0 K/uL   Monocytes Relative 7 %   Monocytes Absolute 0.4 0.1 - 1.0 K/uL   Eosinophils Relative 0 %   Eosinophils Absolute 0.0 0.0 - 0.7 K/uL   Basophils Relative 1 %   Basophils Absolute 0.0 0.0 - 0.1 K/uL  Protime-INR  Result Value Ref Range   Prothrombin Time 14.2 11.6 - 15.2 seconds   INR 1.08 0.00 - 1.49  Ammonia  Result Value Ref Range   Ammonia 123 (H) 9 - 35 umol/L  POC occult blood, ED Provider will collect  Result Value Ref Range   Fecal Occult Bld NEGATIVE NEGATIVE    Imaging Review No results found. I have personally reviewed and evaluated these images and lab results as part of my medical decision-making.   EKG Interpretation None      MDM   Final diagnoses:  None    1. Hepatic encephalopathy 2. Alcoholic cirrhosis  Patient arrives from [facility] with increasing confusion. History of encephalopathy and alcoholic cirrhosis, markedly confused,  disoriented to place and time, does not recognize his wife. VSS. Discussed with [place] who managed recent admission with discharge 08/10/15 providing for 'bounce back' admission to [facility].  Discussed with [facility] resident who accepts for admission.     Charlann Lange, PA-C 09/14/2015 2202

## 2015-08-22 NOTE — H&P (Signed)
[facility] Admission History and Physical Service Pager: [phone]  Patient name: [name] Medical record number: [id number] Date of birth: 1945-07-24 Age: 70 y.o. Gender: male  Primary Care Provider: Lujean Amel, MD Consultants: none Code Status: DNR confirmed at admission with wife  Chief Complaint: increased confusion  Assessment and Plan: [name] is a 70 y.o. male presenting with increasing confusion for one day . 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 Patient with history of portal hypertension s/p TIPS. Ammonia elevated to 123 in the setting of decreased BM's. Patient oriented to self only on exam. Low suspicion for CVA without history of fall/trauma or focal deficit on exam. No fever or leukocytosis to think of infectious process. However, patient with chronic indwelling foley catheter and possibly ascites. No significant electrolyte abnormalities on CMP. BUN and glucose within normal range. -Admit to Med-Surg. Admitting Dr. Erin Hearing -Lactulose 30 mg q6h or 200 mg enema q6h -Rifaximin 550 mg twice a day -Continue folate and thiamine -Vital signs per unit protocol -Pulse ox with vital signs -UDS -NPO except for sips with meds -SLP eval in the morning -UA and Urine culture -AM CBC and CMP -Monitor mental status -SW consult. patient from SNF  CKD-3A: no significant AKI. serum creatinine 1.62. Baseline ~1.3-1.6. Patient with history of urinary retention and indwelling foley. Would consider repeat of voiding trial -D5NS'@100'$  -UA and urine culture -Urology follow up as outpatient. -I&O  -Will monitor with daily BMP  History of Hypotension: normotensive this admission -will continue home midodrine  Snoring/?Sleep apnea: followed by outside pulmonology Dr. Corrie Dandy. Plan was to schedule  outpatient sleep study during his hospitalization in 04/2015 - Dulera 200/5 twice a day  - Duonebs twice a day  - CPAP at night  Gout: Chronic, controlled on allopurinol 300 mg daily at home.  -Continue home allopurinol  S/p esophagectomy: on Omeprazole at home - protonix  Macrocytic anemia: Chronic, stable. Most attributable to cirrhosis and chronic vitamin deficiencies. Maybe acute blood loss contributing with epistaxis. FOBT neg. Stable from 5/1.  - Monitor hgb and signs/symptoms of bleeding.   Lower extremity wounds: No infection, with background xerosis. Last dressing changed 5/6, with recommendations for q3d changes.  - Replace dressing 5/9, defer WOC consult.  FEN/GI: -D5NS at 100 -NPO pending SLP eval -Protonix as above  Prophylaxis: Lovenox   Disposition: Med-Surg pending improvement in mental status.   History of Present Illness:  [name] is a 70 y.o. male presenting with increased confusion.  Per wife, patient has some slurred speech yesterday morning. She denies facial drop or other noticeable focal deficit. Today, his confusion has gotten worse. He was not able to hold his news papers. She says he was getting his lactulose 4 times a day when she visits him at facility. He usually has bowel movement once a day but not today. Per ED note, they talked to nursing home staff Tony Boss, RN) who reported decreased number of stools in the last 2-3 days, down to one daily.  Wife denies fever or cough. She reports some epistaxis at nursing facility.  ED course: vital signs within normal range. CMP with BUN, 13, Cr 1.62, alk phos 186, AST 69, Ammonia 123, WBC 5.4, Hgb 8.5, plt 177. Glucose 107. FOBT negative. EKG with borderline LAD.  Review Of Systems: Per HPI Otherwise the remainder of the systems were negative.  Patient  Active Problem List   Diagnosis Date Noted  . Altered mental status 08/23/2015  . Arterial hypotension   . Hematuria   . Hypernatremia   . AKI  (acute kidney injury) ([place])   . Aspiration pneumonia of right middle lobe ([place])   . Sepsis ([place])   . Encephalopathy acute   . HCAP (healthcare-associated pneumonia)   . Encephalopathy   . Alcohol withdrawal ([place]) 07/12/2015  . Respiratory failure with hypoxia and hypercapnia ([place]) 07/05/2015  . Abdominal distention   . Alcohol abuse   . Cirrhosis ([place])   . Penile ulcer   . Hepatic encephalopathy ([place]) 07/04/2015  . Acid indigestion 01/05/2015  . Cancer of esophagus ([place]) 01/05/2015  . Gout 01/05/2015  . S/P TIPS (transjugular intrahepatic portosystemic shunt)   . Ascites-treated 06/14/2014  . Abnormal magnetic resonance imaging study 05/16/2012  . Chronic cervical pain 05/16/2012    Past Medical History: Past Medical History  Diagnosis Date  . Hypertension     under control  . History of cancer chemotherapy   . Hx of radiation therapy 2002  . Gout     under control  . Umbilical hernia   . Fatty liver     "under control, being monitored"  . Shortness of breath dyspnea   . GERD (gastroesophageal reflux disease)   . Alcoholic cirrhosis ([place])   . Esophageal cancer ([place]) 2002    "partial" esophagus removed  . Sleep apnea     "will not get tested" (08/04/2015)  . Arthritis     "neck" (08/04/2015)    Past Surgical History: Past Surgical History  Procedure Laterality Date  . Esophagectomy  2002    "partial"  . Incisional hernia repair  2004  . Umbilical hernia repair N/A 06/14/2014    Procedure:  OPEN UMBILICAL HERNIA REPAIR;  Surgeon: Pedro Earls, MD;  Location: [facility];  Service: General;  Laterality: N/A;  . Tonsillectomy    . Radiology with anesthesia N/A 02/09/2015    Procedure: TIPS;  Surgeon: Jacqulynn Cadet, MD;  Location: [facility];  Service: Radiology;  Laterality: N/A;  . Hernia repair      Social History: Social History  Substance Use Topics  . Smoking status: Former Smoker -- 1.50 packs/day for 40 years    Types: Cigarettes    Quit date: 04/16/2004   . Smokeless tobacco: Never Used  . Alcohol Use: 50.4 oz/week    84 Glasses of wine per week     Comment: 08/04/2015 "@ least 2 bottles of wire/day"   Additional social history:   Please also refer to relevant sections of EMR.  Family History: Family History  Problem Relation Age of Onset  . Diabetes Father   . Diabetes Brother    (If not completed, MUST add something in)  Allergies and Medications: Allergies  Allergen Reactions  . Amoxicillin Diarrhea   No current facility-administered medications on file prior to encounter.   Current Outpatient Prescriptions on File Prior to Encounter  Medication Sig Dispense Refill  . allopurinol (ZYLOPRIM) 300 MG tablet Take 300 mg by mouth daily.     . budesonide-formoterol (SYMBICORT) 160-4.5 MCG/ACT inhaler Inhale 2 puffs into the lungs 2 (two) times daily.    . ferrous sulfate 325 (65 FE) MG EC tablet Take 325 mg by mouth daily.    . folic acid (FOLVITE) 1 MG tablet Take 1 tablet (1 mg total) by mouth daily. 30 tablet 0  . ipratropium-albuterol (DUONEB) 0.5-2.5 (3) MG/3ML SOLN Take 3 mLs  by nebulization 2 (two) times daily. 360 mL 0  . lactulose (CHRONULAC) 10 GM/15ML solution Take 45 mLs (30 g total) by mouth 4 (four) times daily. Titrate for 3-4 bowel movements a day. 240 mL 0  . midodrine (PROAMATINE) 5 MG tablet Take 1 tablet (5 mg total) by mouth 3 (three) times daily with meals. 30 tablet 0  . nystatin cream (MYCOSTATIN) Apply cream twice daily to buttocks for fungal rash.    . omeprazole (PRILOSEC) 20 MG capsule Take 20 mg by mouth daily.     . OXYGEN Inhale 2 L into the lungs.    . rifaximin (XIFAXAN) 550 MG TABS tablet Take 1 tablet (550 mg total) by mouth 2 (two) times daily. 60 tablet 0  . thiamine 100 MG tablet Take 1 tablet (100 mg total) by mouth daily. 30 tablet 0    Objective: BP 133/82 mmHg  Pulse 84  Temp(Src) 97.6 F (36.4 C) (Oral)  Resp 13  SpO2 99% Exam: GEN: lying in bed, alert, oriented only to self, NAD,  intermittently agitated insisting on getting out of bed quickly redirectable Eye: sclera anicteric +arcus senilis, PERL CVS: regular rate and rhythm, no murmur RESP: no increased work of breathing, clear to auscultation, snoring GI: normal bowel sound, soft, gaseous distention, protuberant without tenderness, no fluid wave or ascities Skin: scattered skin lesions on his legs and foot from scratched is foam dressing. None looked infected. Bandages not removed. Global pallor noted. NEURO: alert and oriented only to self, no focal deficits, grip 5/5, speech is nonsensical but no facial droop.   Labs and Imaging: CBC BMET   Recent Labs Lab 08/18/2015 2038  WBC 5.4  HGB 8.5*  HCT 23.9*  PLT 177    Recent Labs Lab 08/18/2015 2038  NA 134*  K 3.9  CL 100*  CO2 21*  BUN 13  CREATININE 1.62*  GLUCOSE 107*  CALCIUM 9.6     Ammonia 123  Mercy Riding, MD 08/23/2015, 12:13 AM PGY-1, [place] Intern pager: [phone], text pages welcome  I have seen and evaluated the patient with Dr. Cyndia Skeeters. I am in agreement with the note above in its revised form. My additions are in red.  Charmion Hapke B. Bonner Puna, MD, PGY-3 08/23/2015 12:16 AM

## 2015-08-23 ENCOUNTER — Inpatient Hospital Stay (HOSPITAL_COMMUNITY): Payer: Medicare HMO

## 2015-08-23 DIAGNOSIS — R4182 Altered mental status, unspecified: Secondary | ICD-10-CM | POA: Diagnosis present

## 2015-08-23 DIAGNOSIS — K7031 Alcoholic cirrhosis of liver with ascites: Secondary | ICD-10-CM | POA: Insufficient documentation

## 2015-08-23 DIAGNOSIS — K7290 Hepatic failure, unspecified without coma: Secondary | ICD-10-CM

## 2015-08-23 LAB — TSH
TSH: >90 u[IU]/mL — ABNORMAL HIGH (ref 0.350–4.500)
TSH: >90 u[IU]/mL — ABNORMAL HIGH (ref 0.350–4.500)

## 2015-08-23 LAB — CBC
HCT: 27.5 % — ABNORMAL LOW (ref 39.0–52.0)
Hemoglobin: 9.4 g/dL — ABNORMAL LOW (ref 13.0–17.0)
MCH: 34.3 pg — AB (ref 26.0–34.0)
MCHC: 34.2 g/dL (ref 30.0–36.0)
MCV: 100.4 fL — ABNORMAL HIGH (ref 78.0–100.0)
PLATELETS: 176 10*3/uL (ref 150–400)
RBC: 2.74 MIL/uL — ABNORMAL LOW (ref 4.22–5.81)
RDW: 16.5 % — AB (ref 11.5–15.5)
WBC: 5.3 10*3/uL (ref 4.0–10.5)

## 2015-08-23 LAB — COMPREHENSIVE METABOLIC PANEL
ALBUMIN: 2.8 g/dL — AB (ref 3.5–5.0)
ALT: 31 U/L (ref 17–63)
AST: 77 U/L — AB (ref 15–41)
Alkaline Phosphatase: 181 U/L — ABNORMAL HIGH (ref 38–126)
Anion gap: 13 (ref 5–15)
BILIRUBIN TOTAL: 1.1 mg/dL (ref 0.3–1.2)
BUN: 13 mg/dL (ref 6–20)
CALCIUM: 9 mg/dL (ref 8.9–10.3)
CO2: 21 mmol/L — AB (ref 22–32)
CREATININE: 1.4 mg/dL — AB (ref 0.61–1.24)
Chloride: 102 mmol/L (ref 101–111)
GFR calc Af Amer: 56 mL/min — ABNORMAL LOW (ref >60)
GFR calc non Af Amer: 49 mL/min — ABNORMAL LOW (ref >60)
GLUCOSE: 124 mg/dL — AB (ref 65–99)
Potassium: 3.8 mmol/L (ref 3.5–5.1)
Sodium: 136 mmol/L (ref 135–145)
TOTAL PROTEIN: 5.9 g/dL — AB (ref 6.5–8.1)

## 2015-08-23 LAB — GLUCOSE, CAPILLARY: Glucose-Capillary: 112 mg/dL — ABNORMAL HIGH (ref 65–99)

## 2015-08-23 LAB — T4, FREE: Free T4: 0.14 ng/dL — ABNORMAL LOW (ref 0.61–1.12)

## 2015-08-23 LAB — URINALYSIS, ROUTINE W REFLEX MICROSCOPIC
Bilirubin Urine: NEGATIVE
Glucose, UA: NEGATIVE mg/dL
Hgb urine dipstick: NEGATIVE
KETONES UR: NEGATIVE mg/dL
NITRITE: NEGATIVE
PH: 7 (ref 5.0–8.0)
Protein, ur: NEGATIVE mg/dL
SPECIFIC GRAVITY, URINE: 1.011 (ref 1.005–1.030)

## 2015-08-23 LAB — URINE MICROSCOPIC-ADD ON

## 2015-08-23 LAB — MRSA PCR SCREENING: MRSA by PCR: NEGATIVE

## 2015-08-23 LAB — RAPID URINE DRUG SCREEN, HOSP PERFORMED
Amphetamines: NOT DETECTED
Barbiturates: NOT DETECTED
Benzodiazepines: NOT DETECTED
COCAINE: NOT DETECTED
OPIATES: NOT DETECTED
TETRAHYDROCANNABINOL: NOT DETECTED

## 2015-08-23 LAB — CORTISOL: CORTISOL PLASMA: 23 ug/dL

## 2015-08-23 LAB — HEMOGLOBIN AND HEMATOCRIT, BLOOD
HEMATOCRIT: 25.1 % — AB (ref 39.0–52.0)
Hemoglobin: 8.8 g/dL — ABNORMAL LOW (ref 13.0–17.0)

## 2015-08-23 MED ORDER — IPRATROPIUM-ALBUTEROL 0.5-2.5 (3) MG/3ML IN SOLN: 3.0000 mL | Freq: Two times a day (BID) | RESPIRATORY_TRACT | Status: DC

## 2015-08-23 MED ORDER — ACETAMINOPHEN 650 MG RE SUPP: 650.0000 mg | Freq: Four times a day (QID) | RECTAL | Status: DC | PRN

## 2015-08-23 MED ORDER — LACTULOSE ENEMA
300.0000 mL | Freq: Four times a day (QID) | ORAL | Status: DC
  Administered 2015-08-23 – 2015-08-24 (×5): 300 mL via RECTAL
  Filled 2015-08-23 (×7): qty 300

## 2015-08-23 MED ORDER — DEXTROSE-NACL 5-0.9 % IV SOLN
INTRAVENOUS | Status: DC
  Administered 2015-08-23 – 2015-08-25 (×6): via INTRAVENOUS

## 2015-08-23 MED ORDER — LEVOTHYROXINE SODIUM 25 MCG PO TABS
25.0000 ug | ORAL_TABLET | Freq: Every day | ORAL | Status: DC
  Administered 2015-08-23 – 2015-08-24 (×2): 25 ug via ORAL
  Filled 2015-08-23 (×2): qty 1

## 2015-08-23 MED ORDER — LORAZEPAM BOLUS VIA INFUSION: 1.0000 mg | Freq: Once | INTRAVENOUS | Status: DC

## 2015-08-23 MED ORDER — CHLORHEXIDINE GLUCONATE 0.12 % MT SOLN
15.0000 mL | Freq: Two times a day (BID) | OROMUCOSAL | Status: DC
  Administered 2015-08-23 – 2015-08-25 (×4): 15 mL via OROMUCOSAL
  Filled 2015-08-23 (×4): qty 15

## 2015-08-23 MED ORDER — FOLIC ACID 1 MG PO TABS
1.0000 mg | ORAL_TABLET | Freq: Every day | ORAL | Status: DC
  Administered 2015-08-24 – 2015-08-25 (×2): 1 mg via ORAL
  Filled 2015-08-23 (×2): qty 1

## 2015-08-23 MED ORDER — CLOTRIMAZOLE 1 % EX CREA
TOPICAL_CREAM | Freq: Two times a day (BID) | CUTANEOUS | Status: DC
  Administered 2015-08-23 – 2015-08-24 (×4): via TOPICAL
  Administered 2015-08-25: 1 via TOPICAL
  Administered 2015-08-25: 09:00:00 via TOPICAL
  Filled 2015-08-23: qty 15

## 2015-08-23 MED ORDER — ACETAMINOPHEN 500 MG PO TABS: 500.0000 mg | ORAL_TABLET | Freq: Four times a day (QID) | ORAL | Status: DC | PRN

## 2015-08-23 MED ORDER — LORAZEPAM 2 MG/ML IJ SOLN: 1.0000 mg | Freq: Once | INTRAMUSCULAR | Status: DC

## 2015-08-23 MED ORDER — VITAMIN B-1 100 MG PO TABS
100.0000 mg | ORAL_TABLET | Freq: Every day | ORAL | Status: DC
  Administered 2015-08-24 – 2015-08-25 (×2): 100 mg via ORAL
  Filled 2015-08-23 (×2): qty 1

## 2015-08-23 MED ORDER — LORAZEPAM 2 MG/ML IJ SOLN
INTRAMUSCULAR | Status: AC
  Filled 2015-08-23: qty 1

## 2015-08-23 MED ORDER — MIDODRINE HCL 5 MG PO TABS
5.0000 mg | ORAL_TABLET | Freq: Three times a day (TID) | ORAL | Status: DC
  Administered 2015-08-23 – 2015-08-24 (×4): 5 mg via ORAL
  Filled 2015-08-23 (×4): qty 1

## 2015-08-23 MED ORDER — RIFAXIMIN 550 MG PO TABS
550.0000 mg | ORAL_TABLET | Freq: Two times a day (BID) | ORAL | Status: DC
  Administered 2015-08-23 – 2015-08-25 (×4): 550 mg via ORAL
  Filled 2015-08-23 (×4): qty 1

## 2015-08-23 MED ORDER — LORAZEPAM 1 MG PO TABS: 1.0000 mg | ORAL_TABLET | Freq: Once | ORAL | Status: DC

## 2015-08-23 MED ORDER — LACTULOSE 10 GM/15ML PO SOLN
30.0000 g | Freq: Four times a day (QID) | ORAL | Status: DC
  Administered 2015-08-23 – 2015-08-25 (×7): 30 g via ORAL
  Filled 2015-08-23 (×7): qty 45

## 2015-08-23 MED ORDER — LEVOTHYROXINE SODIUM 100 MCG IV SOLR: 200.0000 ug | Freq: Once | INTRAVENOUS | Status: DC

## 2015-08-23 MED ORDER — DEXTROSE-NACL 5-0.9 % IV SOLN
INTRAVENOUS | Status: DC
  Filled 2015-08-23 (×2): qty 1000

## 2015-08-23 MED ORDER — ALLOPURINOL 300 MG PO TABS
300.0000 mg | ORAL_TABLET | Freq: Every day | ORAL | Status: DC
  Administered 2015-08-24 – 2015-08-25 (×2): 300 mg via ORAL
  Filled 2015-08-23 (×2): qty 1

## 2015-08-23 MED ORDER — IPRATROPIUM-ALBUTEROL 0.5-2.5 (3) MG/3ML IN SOLN
3.0000 mL | Freq: Two times a day (BID) | RESPIRATORY_TRACT | Status: DC
  Administered 2015-08-24 – 2015-08-25 (×2): 3 mL via RESPIRATORY_TRACT
  Filled 2015-08-23 (×5): qty 3

## 2015-08-23 MED ORDER — ENOXAPARIN SODIUM 40 MG/0.4ML ~~LOC~~ SOLN
40.0000 mg | Freq: Every day | SUBCUTANEOUS | Status: DC
  Administered 2015-08-23 – 2015-08-24 (×3): 40 mg via SUBCUTANEOUS
  Filled 2015-08-23 (×3): qty 0.4

## 2015-08-23 MED ORDER — FLUMAZENIL 0.5 MG/5ML IV SOLN
0.2000 mg | Freq: Once | INTRAVENOUS | Status: AC
  Administered 2015-08-23: 0.2 mg via INTRAVENOUS
  Filled 2015-08-23: qty 5

## 2015-08-23 MED ORDER — LORAZEPAM 2 MG/ML IJ SOLN
1.0000 mg | Freq: Once | INTRAMUSCULAR | Status: AC
  Administered 2015-08-23: 1 mg via INTRAVENOUS

## 2015-08-23 MED ORDER — DEXTROSE-NACL 5-0.9 % IV SOLN: INTRAVENOUS | Status: DC

## 2015-08-23 MED ORDER — MOMETASONE FURO-FORMOTEROL FUM 200-5 MCG/ACT IN AERO
2.0000 | INHALATION_SPRAY | Freq: Two times a day (BID) | RESPIRATORY_TRACT | Status: DC
  Administered 2015-08-24 – 2015-08-25 (×2): 2 via RESPIRATORY_TRACT
  Filled 2015-08-23 (×2): qty 8.8

## 2015-08-23 MED ORDER — CETYLPYRIDINIUM CHLORIDE 0.05 % MT LIQD
7.0000 mL | Freq: Two times a day (BID) | OROMUCOSAL | Status: DC
  Administered 2015-08-23 – 2015-08-25 (×6): 7 mL via OROMUCOSAL

## 2015-08-23 NOTE — Significant Event (Signed)
Rapid Response Event Note :   Called by Becky Sax, RN that she is unable to arouse patient who was admitted tonight for acute encephalopathy.  Pt received Lactulose per order  at 2358  In  ED prior to arrival on floor but has had no BM yet.    Overview: Time Called: 0611 Arrival Time: 0615 Event Type: Neurologic  Initial Focused Assessment:  Upon arrival to floor pt lying in bed with sonorous respirations 18/min with O2 sats 98% on 2l [place].  Pt yells out "OW" in response to deep sternal rub but is otherwise nonverbal, follows no commands but MAE spontaneously.  Pupils 3mm and brisk.  Bloody oral secretions being suctioned  From mouth.  Interventions:  VS: 98.0 orally, 156/89  HR 82 and regular,    CBG: 112    Spoke with Dr Cyndia Skeeters re patient and his  likely increasing ammonia level.  PT made NPO and will receive Lactulose enema ASAP.    Pharmacy notified  Hand off report given to Becky Sax, RN   Will support patient and staff as needed   Event Summary: Name of Physician Notified: Dr Cyndia Skeeters at [phone]    at    Outcome: Stayed in room and stabalized     [name], [name]

## 2015-08-23 NOTE — Progress Notes (Signed)
Pt.got very confused & tried to get OOB & very combative.MD oncall was called & ordered Ativan 1 mg iv .& was given.

## 2015-08-23 NOTE — Progress Notes (Signed)
Admitted pt.from ED via stretcher ,alert,oriented to self only,no distress noted.With saline lock on rt.AC in place ,no signs of infilt.noted.V/S taken & recorded.Made comfortable in bed.Placed ID bracelet after verification with pt.Skin is jaundice,dry & flaky & noted rash on his buttocks & abrasion on his lt. & rt.leg  & rt.foot.foam dsd.in place.Instructed to call the nurse when getting OOB.call bell with in reach.Will continue to monitor pt.

## 2015-08-23 NOTE — Consult Note (Addendum)
WOC wound consult note Reason for Consult: Consult requested for sacrum and left leg and toes.  Pt was previously very agitated and combative and has just been placed in restraints.  Bedside nurse states she assessed his skin to sacrum and buttocks when he was turned over and his skin is slightly discolored but intact without any wounds.  Foam dressing has been applied to protect the location.   Wound type: Left plantar foot with dry callous; 3X3cm raised above skin level, dark brown at edges, no odor, drainage, fluctuance, or open wound.  Left anterior great toe with partial thickness abrasion, .3X.3X.1cm red with small amt bleeding.   Left leg with multiple areas of bruising and dry scabbed areas.  Left middle toe with .2X.2cm black scab. Dressing procedure/placement/frequency: Foam dressings to protect from further injury to left toes.   Please re-consult if further assistance is needed.  Thank-you,  Julien Girt MSN, [place], [place], [place], [place]

## 2015-08-23 NOTE — Progress Notes (Signed)
SLP Cancellation Note  Patient Details Name: [name] MRN: [id number] DOB: 04-Jun-1945   Cancelled treatment:       Reason Eval/Treat Not Completed: Fatigue/lethargy limiting ability to participate. Not appropriately arousable for PO intake at this time.    Martyn Timme, Katherene Ponto 08/23/2015, 9:50 AM

## 2015-08-23 NOTE — Progress Notes (Addendum)
RT called for rapid response. Initially RT found pt snoring and having respiratory depression. Pt on 2L [place] sats 100%, RR 16. RN gave Flumazenil. RT and RN sat pt up at 90 degrees. Pt became more awake and talking post Flumazaenil.  MD and RN at bedside. RT placed nasal trumpet and Cpap at bedside. At this time no further orders but to monitor pts respiratory status. Per MD no cpap or nasal trumpet at this time. Pt sleeping snoring but can be aroused by RN .  BBS Crsdim, HR 76, RR 18, RA, sats 100.  Will cont to monitor. Pt combative and biting tongue-MD aware and at bedside of tongue swollen

## 2015-08-23 NOTE — Progress Notes (Signed)
Patient lethargic with labored and sonorous respirations. Notified rapid response nurse and respiratory therapist. RT and med student in to assess patient. MD placed order for flumazenil for ativan that had been given earlier in the morning. Patient became more arousable, but still disoriented and slurring words. O2 sats 98% on 2L/[place]. Suction at bedside and used intermittently. Bed in lowest position. Low bed ordered for patient's safety. Bed alarm on.

## 2015-08-23 NOTE — Progress Notes (Signed)
Patient combative and attempting to get OOB. Having bowel incontinence from lactulose enemas, moving all around in bed requiring 3-4 person assist. Continue to have extensive confusion. O2 sats 99% on 2L/[place]. Suction at bedside. Patient in low bed with floor mats on each side. Bed alarm on.

## 2015-08-23 NOTE — Progress Notes (Addendum)
Pts family member at the bedside and stated pt has been combative still and pt biting tongue (tongue swollen MD aware) Family member states pt is in denial about his sleep apnea and refuses to wear cpap at nursing home

## 2015-08-23 NOTE — Progress Notes (Signed)
Family Medicine Teaching Service Daily Progress Note Intern Pager: [phone]  Patient name: [name] Medical record number: [id number] Date of birth: [birth year] Age: 70 y.o. Gender: male  Primary Care Provider: Lujean Amel, MD Consultants: RT, palliative Code Status: DNR/DNI  Pt Overview and Major Events to Date:  Acute encephalopathy, Unstable protection of airway, AMS  Assessment and Plan: [name] is a 70 y.o. male admitted for AMS. 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 Ammonia elevated to 123 in the setting of decreased BMs. On admission, oriented only to self.  -Admit to Med-Surg. Admitting Dr. Erin Hearing -If patient becomes combative overnight do not administer Ativan or Haldol. Consider restrain.  -NPO per SLP (unable to arouse enough) -UDS negative -UA w/ few bacteria and small leuks -Repeat ammonia level  -Bicarb 21, otherwise BMP wnl. Lactic acid 2.2 on admission.  -Lactulose 30 mg q6h or 200 mg enema q6h -Rifaximin 550 mg twice a day -Continue folate and thiamine -Monitor mental status -SW consult. Patient from SNF -Palliative consult ordered to assess/set goals of care  Macrocytic anemia: Chronic, stable. Most attributable to cirrhosis and chronic vitamin deficiencies. Maybe acute blood loss contributing with epistaxis. FOBT neg. Stable from 5/1.  - Hb trending down 10.3 (4/21)->8.5->9.4-> 8.8 - If continues, consider iron studies and colonoscopy, if in accordance to goals of care.   Wounds/sores: eschar and dry skin in legs and foot. Discoloration on lower back/sacral area.  - Wound care consulted for foot/leg and sacral area  CKD-3A: no significant AKI. serum creatinine 1.4. Baseline ~1.3-1.6. Patient with history of urinary retention and indwelling foley. Would consider repeat of voiding  trial -D5NS@100  -Urology follow up as outpatient. -I&Os: 500 cc out today   -Will monitor with daily BMP  History of Hypotension: normotensive this admission -will continue home midodrine  Snoring/?Sleep apnea: followed by outside pulmonology Dr. Corrie Dandy. Plan was to schedule outpatient sleep study during his hospitalization in 04/2015 - Dulera 200/5 twice a day  - Duonebs twice a day  - CPAP at night  Gout: Chronic, controlled on allopurinol 300 mg daily at home.  -Continue home allopurinol  S/p esophagectomy: on Omeprazole at home - protonix  Lower extremity wounds: No infection, with background xerosis. Last dressing changed 5/6, with recommendations for q3d changes.  - Replace dressing 5/9, defer WOC consult.  FEN/GI: -D5NS at 100 -NPO pending SLP eval -Protonix as above  Prophylaxis: Lovenox   Disposition: SNF when able  Subjective:  Pt became combative overnight and received 1 g of Ativan. RT was concerned about him protecting his airway this morning and he received flumazenil. He became more alert after that. Unable to examine.   Objective: Temp:  [97.6 F (36.4 C)-98.4 F (36.9 C)] 98 F (36.7 C) (05/09 0552) Pulse Rate:  [73-93] 75 (05/09 1140) Resp:  [12-32] 24 (05/09 1140) BP: (111-177)/(72-95) 117/85 mmHg (05/09 0850) SpO2:  [96 %-100 %] 99 % (05/09 1140) Weight:  [96.2 kg (212 lb 1.3 oz)] 96.2 kg (212 lb 1.3 oz) (05/09 0027)   Physical Exam: Unable to examine 2/2 to patient being somnolent and RT assessing protection of airway. General: Deferred Cardiovascular: Deferred Respiratory: Deferred Abdomen: Deferred Extremities: Deferred  Laboratory:  Recent Labs Lab 08/25/2015 2038 08/23/15 0601 08/23/15 1056  WBC 5.4 5.3  --   HGB 8.5* 9.4* 8.8*  HCT 23.9* 27.5* 25.1*  PLT 177 176  --  Recent Labs Lab 08/16/2015 2038 08/23/15 0601  NA 134* 136  K 3.9 3.8  CL 100* 102  CO2 21* 21*  BUN 13 13  CREATININE 1.62* 1.40*  CALCIUM 9.6  9.0  PROT 6.2* 5.9*  BILITOT 1.3* 1.1  ALKPHOS 186* 181*  ALT 31 31  AST 69* 77*  GLUCOSE 107* 124*   Ammonia 123  Imaging/Diagnostic Tests: Dg Chest Port 1 View  08/23/2015  CLINICAL DATA:  Vomiting during sleep. EXAM: PORTABLE CHEST 1 VIEW COMPARISON:  08/07/2015 and 08/04/2015 FINDINGS: Lungs are somewhat hypoinflated with mild right infrahilar opacification and left retrocardiac opacification as cannot exclude atelectasis or infection. Stable cardiomegaly. Calcified plaque over the thoracic aorta. Remainder of the exam is unchanged. IMPRESSION: Mild right infrahilar and left retrocardiac opacification which may be due to atelectasis or infection. Stable cardiomegaly. Electronically Signed   By: Marin Olp M.D.   On: 08/23/2015 09:39    Hershal Coria, Med Student 08/23/2015, 11:57 AM MS4, [employer] Intern pager: [phone], text pages welcome  I have separately seen and examined the patient. I have discussed the findings and exam with Student Dr Rosalia Hammers and agree with the above note.  My changes/additions are outlined in BLUE.   BP 117/85 mmHg  Pulse 75  Temp(Src) 98 F (36.7 C) (Oral)  Resp 24  Ht 6\' 2"  (1.88 m)  Wt 212 lb 1.3 oz (96.2 kg)  BMI 27.22 kg/m2  SpO2 99% Gen: disgruntled, lying in bed s/p enema HEENT: macroglossia, hemostatic lesion on lateral aspect of tongue, MMM Cardio: RRR Pulm: rhonchi throughout (though seems to be transmitted from upper airways) GI: protuberant abdomen, NT, +BS GU: foley catheter in place Ext: LLE with skin break down, left ankle with what appears to be eschar formation Skin: as above, bilateral buttocks with erythematous maculopapular rash that is blanching and does not appear to involve the inguinal folds  AMS: suspect acute hepatic encephalopathy. Ammonia elevated to 123 in the setting of decreased BMs.  Had an episode of decreased responsiveness this am.  Rapid response called and Flumazenil given.  Patient  responded to this and became combative.  Per RN, there was some concern for aspiration, as patient was found with small pool of vomit (maroon colored).  On my exam, patient was s/p lactulose enema.  His lung exam was rhonchorus but suspect this was from upper airway.  Soft restraints were required for administration.  Patient is at increased risk of ammonia induced encephalopathy given h/o TIPS. - Will need to continue Lactulose enemas (or PO once passes SLP eval) titrated to 3 BMs daily.   - Would not trend ammonia levels, rather titrate lactulose based on BM/Mental status - Lactulose 30 mg q6h or 200 mg enema q6h - Rifaximin 550 mg twice a day - Continue folate and thiamine - If patient becomes combative do not administer Ativan or Haldol. Consider soft restraints. - NPO per SLP (unable to arouse enough) - Monitor mental status - SW consult. Patient from SNF - Palliative consult ordered to assess/set goals of care - Monitor for signs of infection.  Would obtain CXR and start empiric abx to cover aspiration pna if develops fevers, increased HR, SOB.  Macrocytic anemia: Chronic, stable. Most attributable to cirrhosis and chronic vitamin deficiencies. Maybe acute blood loss contributing. FOBT neg. Bit tongue at some point.  Still a possibility of gastric varices but low likelihood given TIPS - Hb trending down 10.3 (4/21)->8.5->9.4-> 8.8 - AM CBC - If continues,  consider iron studies and colonoscopy, if in accordance to goals of care.   Wounds/sores/ skin: eschar and dry skin in legs and foot. Discoloration on lower back/sacral area.  - Wound care consulted for foot/leg and sacral area - Clotrimazole added for rash on buttocks, could consider adding steroid cream if no improvement  CKD-3A: no significant AKI. serum creatinine 1.4. Baseline ~1.3-1.6. Patient with history of urinary retention and indwelling foley.  - Consider repeat of voiding trial once MS improving - D5NS@100  - Urology follow  up as outpatient. - monitor I&O - monitor daily BMP  History of Hypotension: normotensive  -will continue home midodrine  Snoring/?Sleep apnea: followed by outside pulmonology Dr. Corrie Dandy. Plan was to schedule outpatient sleep study during his hospitalization in 04/2015 - Dulera 200/5 twice a day  - Duonebs twice a day  - CPAP at night   Ashly M. Lajuana Ripple, DO PGY-2, [place]

## 2015-08-23 NOTE — Progress Notes (Signed)
Patient continuous to bite tongue. Tongue edematous and bleeding. Attempted to suction patient with small catheter instead of yaunkeur, but patient continued to bite down and would not allow RN to suction out secretions.

## 2015-08-23 NOTE — Progress Notes (Signed)
Resident at bedside, aware of patient's behaviors, combative and attempting to get out of bed. Received verbal order for four point restraints for safety.

## 2015-08-24 DIAGNOSIS — Z66 Do not resuscitate: Secondary | ICD-10-CM

## 2015-08-24 DIAGNOSIS — Z95828 Presence of other vascular implants and grafts: Secondary | ICD-10-CM

## 2015-08-24 DIAGNOSIS — Z515 Encounter for palliative care: Secondary | ICD-10-CM

## 2015-08-24 DIAGNOSIS — E038 Other specified hypothyroidism: Secondary | ICD-10-CM | POA: Insufficient documentation

## 2015-08-24 DIAGNOSIS — E035 Myxedema coma: Secondary | ICD-10-CM

## 2015-08-24 LAB — HEMOGLOBIN AND HEMATOCRIT, BLOOD
HCT: 23.8 % — ABNORMAL LOW (ref 39.0–52.0)
HEMATOCRIT: 24.3 % — AB (ref 39.0–52.0)
Hemoglobin: 8.1 g/dL — ABNORMAL LOW (ref 13.0–17.0)
Hemoglobin: 8.3 g/dL — ABNORMAL LOW (ref 13.0–17.0)

## 2015-08-24 LAB — BASIC METABOLIC PANEL
ANION GAP: 11 (ref 5–15)
BUN: 12 mg/dL (ref 6–20)
CALCIUM: 8.1 mg/dL — AB (ref 8.9–10.3)
CO2: 20 mmol/L — ABNORMAL LOW (ref 22–32)
Chloride: 110 mmol/L (ref 101–111)
Creatinine, Ser: 1.33 mg/dL — ABNORMAL HIGH (ref 0.61–1.24)
GFR, EST NON AFRICAN AMERICAN: 52 mL/min — AB (ref >60)
Glucose, Bld: 276 mg/dL — ABNORMAL HIGH (ref 65–99)
POTASSIUM: 3.2 mmol/L — AB (ref 3.5–5.1)
SODIUM: 141 mmol/L (ref 135–145)

## 2015-08-24 LAB — URINE CULTURE: Culture: NO GROWTH

## 2015-08-24 LAB — T3, FREE: T3 FREE: 0.5 pg/mL — AB (ref 2.0–4.4)

## 2015-08-24 MED ORDER — RESOURCE THICKENUP CLEAR PO POWD
ORAL | Status: DC | PRN
  Filled 2015-08-24: qty 125

## 2015-08-24 MED ORDER — POTASSIUM CHLORIDE 20 MEQ/15ML (10%) PO SOLN
40.0000 meq | Freq: Once | ORAL | Status: AC
  Administered 2015-08-24: 40 meq via ORAL
  Filled 2015-08-24: qty 30

## 2015-08-24 MED ORDER — LEVOTHYROXINE SODIUM 100 MCG IV SOLR
37.5000 ug | Freq: Every day | INTRAVENOUS | Status: DC
  Administered 2015-08-24 – 2015-08-25 (×2): 37.5 ug via INTRAVENOUS
  Filled 2015-08-24 (×2): qty 5

## 2015-08-24 NOTE — Clinical Social Work Note (Signed)
Clinical Social Work Assessment  Patient Details  Name: [name] MRN: [id number] Date of Birth: 12-07-1945  Date of referral:  08/24/15               Reason for consult:  Facility Placement                Permission sought to share information with:  Facility Sport and exercise psychologist, Family Supports Permission granted to share information::  No (Patient is disoriented; completed assessment with wife at bedside)  Name::     Mikle Bosworth  Agency::  [facility] SN[facility]  Relationship::  Wife  Contact Information:  [phone]  Housing/Transportation Living arrangements for the past 2 months:  [place], [place] of Information:  Spouse Patient Interpreter Needed:  None Criminal Activity/Legal Involvement Pertinent to Current Situation/Hospitalization:  No - Comment as needed Significant Relationships:  Spouse Lives with:  Spouse Do you feel safe going back to the place where you live?  No Need for family participation in patient care:  Yes (Comment)  Care giving concerns:  CSW received consult regarding returning patient to SNF at discharge. Patient is disoriented. CSW spoke with wife at bedside.   Social Worker assessment / plan:  Patient to return to [facility] at discharge with PTAR.   Employment status:  Retired Nurse, adult PT Recommendations:  Not assessed at this time Information / Referral to community resources:  [place]  Patient/Family's Response to care:  Patient's wife recognizes need for rehab before returning home and is agreeable to returning to [facility].  Patient/Family's Understanding of and Emotional Response to Diagnosis, Current Treatment, and Prognosis:  Patient is realistic regarding therapy needs. No questions/concerns about plan or treatment.    Emotional Assessment Appearance:  Appears stated age Attitude/Demeanor/Rapport:  Unable to Assess Affect (typically observed):   Unable to Assess Orientation:  Oriented to Self Alcohol / Substance use:  Alcohol Use Psych involvement (Current and /or in the community):  No (Comment)  Discharge Needs  Concerns to be addressed:  Care Coordination Readmission within the last 30 days:  Yes Current discharge risk:  None Barriers to Discharge:  Continued Medical Work up   [employer], [place] 08/24/2015, 11:31 AM

## 2015-08-24 NOTE — Consult Note (Signed)
[facility] Reason for consult: hepatic encephalopathy Referring Physician: family practice service. Asked by wife to look in on patient  [name] is an 70 y.o. male.  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 Unfortunately, it became progressively difficult to manage his ascites. Initially responded diuretics and then he developed borderline hepatorenal syndrome. 6 or 8 months ago after several close calls with hepatorenal syndrome, we decided to do a TIPS procedure which resulted in much better profusion of his kidneys and improved marked improvement in his creatinine. This decision was made with assistance from nephrology. Unfortunately, the patient continued to drink after this and has had progressive decline in his liver function and develop chronic hepatic encephalopathy. In addition, he is developed sleep apnea as he has felt better and gains weight. At times he has been lucid and other times completely confused despite the use of lactulose. He was readmitted with confusion and altered mental status. His labs indicate albumin 2.9 slight elevation of total bilirubin slight elevation of AST. His MCV is quite elevated consistent with history of alcohol use. Ammonia is elevated but protime was normal. The other significant finding on admission is marked elevation of TSH and very low T4. This is a new finding. Past Medical History  Diagnosis Date  . Hypertension     under control  . History of cancer chemotherapy   . Hx of radiation therapy 2002  . Gout     under control  . Umbilical hernia   . Fatty liver     "under control, being monitored"  . Shortness of breath dyspnea   . GERD (gastroesophageal reflux disease)   . Alcoholic  cirrhosis ([place])   . 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 Sleep apnea     "will not get tested" (08/04/2015)  . Arthritis     "neck" (08/04/2015)    Past Surgical History  Procedure Laterality Date  . Esophagectomy  2002    "partial"  . Incisional hernia repair  2004  . Umbilical hernia repair N/A 06/14/2014    Procedure:  OPEN UMBILICAL HERNIA REPAIR;  Surgeon: Pedro Earls, MD;  Location: [facility];  Service: General;  Laterality: N/A;  . Tonsillectomy    . Radiology with anesthesia N/A 02/09/2015    Procedure: TIPS;  Surgeon: Jacqulynn Cadet, MD;  Location: [place];  Service: Radiology;  Laterality: N/A;  . Hernia repair      Family History  Problem Relation Age of Onset  . Diabetes Father   . Diabetes Brother     Social History:  reports that he quit smoking about 11 years ago. His smoking use included Cigarettes. He has a 60 pack-year smoking history. He has never used smokeless tobacco. He reports that he drinks about 50.4 oz of alcohol per week. He reports that he does not use illicit drugs.  Allergies:  Allergies  Allergen Reactions  . Amoxicillin Diarrhea    Medications; Prior to Admission medications   Medication Sig Start Date End Date Taking? Authorizing Provider  allopurinol (ZYLOPRIM) 300 MG tablet Take 300 mg by mouth daily.  12/20/12  Yes Historical Provider, MD  budesonide-formoterol (SYMBICORT) 160-4.5 MCG/ACT inhaler Inhale 2 puffs into the lungs 2 (two) times daily.   Yes  Historical Provider, MD  ferrous sulfate 325 (65 FE) MG EC tablet Take 325 mg by mouth daily.   Yes Historical Provider, MD  folic acid (FOLVITE) 1 MG tablet Take 1 tablet (1 mg total) by mouth daily. 07/22/15  Yes Mercy Riding, MD  ipratropium-albuterol (DUONEB) 0.5-2.5 (3) MG/3ML SOLN Take 3 mLs by nebulization 2 (two) times daily. 07/22/15  Yes Mercy Riding, MD  lactulose (CHRONULAC) 10 GM/15ML solution Take 45 mLs (30 g total) by mouth 4 (four) times daily. Titrate  for 3-4 bowel movements a day. 08/09/15  Yes Mercy Riding, MD  midodrine (PROAMATINE) 5 MG tablet Take 1 tablet (5 mg total) by mouth 3 (three) times daily with meals. 07/22/15  Yes Mercy Riding, MD  nystatin cream (MYCOSTATIN) Apply cream twice daily to buttocks for fungal rash.   Yes Historical Provider, MD  omeprazole (PRILOSEC) 20 MG capsule Take 20 mg by mouth daily.  01/18/13  Yes Historical Provider, MD  OXYGEN Inhale 2 L into the lungs.   Yes Historical Provider, MD  rifaximin (XIFAXAN) 550 MG TABS tablet Take 1 tablet (550 mg total) by mouth 2 (two) times daily. 08/09/15  Yes Mercy Riding, MD  thiamine 100 MG tablet Take 1 tablet (100 mg total) by mouth daily. 07/22/15  Yes Mercy Riding, MD   . allopurinol  300 mg Oral Daily  . antiseptic oral rinse  7 mL Mouth Rinse q12n4p  . chlorhexidine  15 mL Mouth Rinse BID  . clotrimazole   Topical BID  . enoxaparin (LOVENOX) injection  40 mg Subcutaneous QHS  . folic acid  1 mg Oral Daily  . ipratropium-albuterol  3 mL Nebulization BID  . lactulose  30 g Oral QID   Or  . lactulose  300 mL Rectal QID  . levothyroxine  25 mcg Oral QAC breakfast  . midodrine  5 mg Oral TID WC  . mometasone-formoterol  2 puff Inhalation BID  . rifaximin  550 mg Oral BID  . thiamine  100 mg Oral Daily   PRN Meds acetaminophen **OR** acetaminophen Results for orders placed or performed during the hospital encounter of 08/31/2015 (from the past 48 hour(s))  Comprehensive metabolic panel     Status: Abnormal   Collection Time: 08/21/2015  8:38 PM  Result Value Ref Range   Sodium 134 (L) 135 - 145 mmol/L   Potassium 3.9 3.5 - 5.1 mmol/L   Chloride 100 (L) 101 - 111 mmol/L   CO2 21 (L) 22 - 32 mmol/L   Glucose, Bld 107 (H) 65 - 99 mg/dL   BUN 13 6 - 20 mg/dL   Creatinine, Ser 1.62 (H) 0.61 - 1.24 mg/dL   Calcium 9.6 8.9 - 10.3 mg/dL   Total Protein 6.2 (L) 6.5 - 8.1 g/dL   Albumin 2.9 (L) 3.5 - 5.0 g/dL   AST 69 (H) 15 - 41 U/L   ALT 31 17 - 63 U/L   Alkaline  Phosphatase 186 (H) 38 - 126 U/L   Total Bilirubin 1.3 (H) 0.3 - 1.2 mg/dL   GFR calc non Af Amer 41 (L) >60 mL/min   GFR calc Af Amer 47 (L) >60 mL/min    Comment: (NOTE) The eGFR has been calculated using the CKD EPI equation. This calculation has not been validated in all clinical situations. eGFR's persistently <60 mL/min signify possible Chronic Kidney Disease.    Anion gap 13 5 - 15  CBC with Differential  Status: Abnormal   Collection Time: 09/09/2015  8:38 PM  Result Value Ref Range   WBC 5.4 4.0 - 10.5 K/uL   RBC 2.35 (L) 4.22 - 5.81 MIL/uL   Hemoglobin 8.5 (L) 13.0 - 17.0 g/dL   HCT 11.0 (L) 45.1 - 10.4 %   MCV 101.7 (H) 78.0 - 100.0 fL   MCH 36.2 (H) 26.0 - 34.0 pg   MCHC 35.6 30.0 - 36.0 g/dL   RDW 75.7 (H) 47.1 - 39.3 %   Platelets 177 150 - 400 K/uL   Neutrophils Relative % 78 %   Neutro Abs 4.3 1.7 - 7.7 K/uL   Lymphocytes Relative 14 %   Lymphs Abs 0.7 0.7 - 4.0 K/uL   Monocytes Relative 7 %   Monocytes Absolute 0.4 0.1 - 1.0 K/uL   Eosinophils Relative 0 %   Eosinophils Absolute 0.0 0.0 - 0.7 K/uL   Basophils Relative 1 %   Basophils Absolute 0.0 0.0 - 0.1 K/uL  Protime-INR     Status: None   Collection Time: 08/18/2015  8:38 PM  Result Value Ref Range   Prothrombin Time 14.2 11.6 - 15.2 seconds   INR 1.08 0.00 - 1.49  Ammonia     Status: Abnormal   Collection Time: 09/03/2015  8:38 PM  Result Value Ref Range   Ammonia 123 (H) 9 - 35 umol/L  POC occult blood, ED Provider will collect     Status: None   Collection Time: 08/24/2015  9:16 PM  Result Value Ref Range   Fecal Occult Bld NEGATIVE NEGATIVE  MRSA PCR Screening     Status: None   Collection Time: 08/23/15 12:39 AM  Result Value Ref Range   MRSA by PCR NEGATIVE NEGATIVE    Comment:        The GeneXpert MRSA Assay (FDA approved for NASAL specimens only), is one component of a comprehensive MRSA colonization surveillance program. It is not intended to diagnose MRSA infection nor to guide  or monitor treatment for MRSA infections.   Urinalysis, Routine w reflex microscopic (not at [facility])     Status: Abnormal   Collection Time: 08/23/15  2:32 AM  Result Value Ref Range   Color, Urine YELLOW YELLOW   APPearance CLOUDY (A) CLEAR   Specific Gravity, Urine 1.011 1.005 - 1.030   pH 7.0 5.0 - 8.0   Glucose, UA NEGATIVE NEGATIVE mg/dL   Hgb urine dipstick NEGATIVE NEGATIVE   Bilirubin Urine NEGATIVE NEGATIVE   Ketones, ur NEGATIVE NEGATIVE mg/dL   Protein, ur NEGATIVE NEGATIVE mg/dL   Nitrite NEGATIVE NEGATIVE   Leukocytes, UA SMALL (A) NEGATIVE  Urine rapid drug screen (hosp performed)     Status: None   Collection Time: 08/23/15  2:32 AM  Result Value Ref Range   Opiates NONE DETECTED NONE DETECTED   Cocaine NONE DETECTED NONE DETECTED   Benzodiazepines NONE DETECTED NONE DETECTED   Amphetamines NONE DETECTED NONE DETECTED   Tetrahydrocannabinol NONE DETECTED NONE DETECTED   Barbiturates NONE DETECTED NONE DETECTED    Comment:        DRUG SCREEN FOR MEDICAL PURPOSES ONLY.  IF CONFIRMATION IS NEEDED FOR ANY PURPOSE, NOTIFY LAB WITHIN 5 DAYS.        LOWEST DETECTABLE LIMITS FOR URINE DRUG SCREEN Drug Class       Cutoff (ng/mL) Amphetamine      1000 Barbiturate      200 Benzodiazepine   200 Tricyclics       300 Opiates  300 Cocaine          300 THC              50   Urine microscopic-add on     Status: Abnormal   Collection Time: 08/23/15  2:32 AM  Result Value Ref Range   Squamous Epithelial / LPF 0-5 (A) NONE SEEN   WBC, UA 0-5 0 - 5 WBC/hpf   RBC / HPF 0-5 0 - 5 RBC/hpf   Bacteria, UA FEW (A) NONE SEEN   Urine-Other YEAST PRESENT   Comprehensive metabolic panel     Status: Abnormal   Collection Time: 08/23/15  6:01 AM  Result Value Ref Range   Sodium 136 135 - 145 mmol/L   Potassium 3.8 3.5 - 5.1 mmol/L   Chloride 102 101 - 111 mmol/L   CO2 21 (L) 22 - 32 mmol/L   Glucose, Bld 124 (H) 65 - 99 mg/dL   BUN 13 6 - 20 mg/dL   Creatinine, Ser  1.40 (H) 0.61 - 1.24 mg/dL   Calcium 9.0 8.9 - 10.3 mg/dL   Total Protein 5.9 (L) 6.5 - 8.1 g/dL   Albumin 2.8 (L) 3.5 - 5.0 g/dL   AST 77 (H) 15 - 41 U/L   ALT 31 17 - 63 U/L   Alkaline Phosphatase 181 (H) 38 - 126 U/L   Total Bilirubin 1.1 0.3 - 1.2 mg/dL   GFR calc non Af Amer 49 (L) >60 mL/min   GFR calc Af Amer 56 (L) >60 mL/min    Comment: (NOTE) The eGFR has been calculated using the CKD EPI equation. This calculation has not been validated in all clinical situations. eGFR's persistently <60 mL/min signify possible Chronic Kidney Disease.    Anion gap 13 5 - 15  CBC     Status: Abnormal   Collection Time: 08/23/15  6:01 AM  Result Value Ref Range   WBC 5.3 4.0 - 10.5 K/uL   RBC 2.74 (L) 4.22 - 5.81 MIL/uL   Hemoglobin 9.4 (L) 13.0 - 17.0 g/dL   HCT 27.5 (L) 39.0 - 52.0 %   MCV 100.4 (H) 78.0 - 100.0 fL   MCH 34.3 (H) 26.0 - 34.0 pg   MCHC 34.2 30.0 - 36.0 g/dL   RDW 16.5 (H) 11.5 - 15.5 %   Platelets 176 150 - 400 K/uL  Glucose, capillary     Status: Abnormal   Collection Time: 08/23/15  6:36 AM  Result Value Ref Range   Glucose-Capillary 112 (H) 65 - 99 mg/dL  Hemoglobin and hematocrit, blood     Status: Abnormal   Collection Time: 08/23/15 10:56 AM  Result Value Ref Range   Hemoglobin 8.8 (L) 13.0 - 17.0 g/dL   HCT 25.1 (L) 39.0 - 52.0 %  TSH     Status: Abnormal   Collection Time: 08/23/15  3:34 PM  Result Value Ref Range   TSH >90.000 (H) 0.350 - 4.500 uIU/mL  T4, free     Status: Abnormal   Collection Time: 08/23/15  7:08 PM  Result Value Ref Range   Free T4 0.14 (L) 0.61 - 1.12 ng/dL  TSH     Status: Abnormal   Collection Time: 08/23/15  7:08 PM  Result Value Ref Range   TSH >90.000 (H) 0.350 - 4.500 uIU/mL  Cortisol     Status: None   Collection Time: 08/23/15  7:08 PM  Result Value Ref Range   Cortisol, Plasma 23.0 ug/dL    Comment: (  NOTE) AM    6.7 - 22.6 ug/dL PM   <10.0       ug/dL     Dg Chest Port 1 View  08/23/2015  CLINICAL DATA:   Vomiting during sleep. EXAM: PORTABLE CHEST 1 VIEW COMPARISON:  08/07/2015 and 08/04/2015 FINDINGS: Lungs are somewhat hypoinflated with mild right infrahilar opacification and left retrocardiac opacification as cannot exclude atelectasis or infection. Stable cardiomegaly. Calcified plaque over the thoracic aorta. Remainder of the exam is unchanged. IMPRESSION: Mild right infrahilar and left retrocardiac opacification which may be due to atelectasis or infection. Stable cardiomegaly. Electronically Signed   By: Marin Olp M.D.   On: 08/23/2015 09:39               Blood pressure 124/69, pulse 73, temperature 97.3 F (36.3 C), temperature source Oral, resp. rate 18, height '6\' 2"'$  (1.88 m), weight 96.2 kg (212 lb 1.3 oz), SpO2 100 %.  Physical exam:   General-- patient in bed and restraints and nonresponsive. Actively  snoring  ENT-- very thick neck sclera nonicteric  Heart-- regular rate and rhythm without murmurs are gallops  Lungs-- clear  Abdomen-- obese but no gross societies  Extremities - no pitting edema  Psych-- nonresponsive    Assessment: 1. Encephalopathy. Clearly there is a factor from hepatic encephalopathy from cirrhosis. He's persistently had elevated ammonia and has responded to treatment for hepatic encephalopathy. The severe hypothyroidism is a new finding and may well be contributing to his encephalopathy.  2. Cirrhosis secondary alcohol. Patient is still continuing to drink. Fortunately his liver test are fairly good and he has no ascites or peripheral edema  3. CKD. Creatinine much better after placement of TIPS 4. Hypothyroidism. This is a new finding to me. This is not been on his problem list in the past. Could be contributed to by radiation for his esophageal cancer. I think this could well explain his decline, weight gain, and be contributing to his encephalopathy. 5. Status post esophagectomy for esophageal cancer. Had radiation and chemo several years ago  and has been followed endoscopically as well as by CT and has had no recurrence.   Plan: 1. I would continue with lactulose and rifaximin for his encephalopathy assuming a good part of it is probably hepatic encephalopathy  2. Correction of his severe hypothyroidism in the hopes that this will help all of his other problems.    I have had a discussion with his wife that his liver has continued to decline functionally due to his continued drinking. She understands that his continued drinking will ultimately result liver failure. I know him well and followed him for 15 years. Please feel free to call for any questions and I will look in on him every day or 2 while he is in the hospital.   [place] Kinyon JR,Lorraine Terriquez L 08/24/2015, 7:19 AM   This note was created using voice recognition software and minor errors may Have occurred unintentionally. Pager: [phone] If no answer or after hours call [phone]

## 2015-08-24 NOTE — [place] FL2 (Deleted)
[place] MEDICAID FL2 LEVEL OF CARE SCREENING TOOL     IDENTIFICATION  Patient Name: [name] Birthdate: 02/16/1946 Sex: male Admission Date (Current Location): 08/31/2015  [facility] and [place] Number:  Herbalist and Address:  [facility] [place] [place] [address] [place], [place] [address]      Provider Number: [id number]  Attending Physician Name and Address:  Lind Covert, MD  Relative Name and Phone Number:  [place], [place] [phone] or [phone]    Current Level of Care: Hospital Recommended Level of Care: [place] Prior Approval Number:    Date Approved/Denied:   PASRR Number: [id number]  Discharge Plan: SNF    Current Diagnoses: Patient Active Problem List   Diagnosis Date Noted  . DNR (do not resuscitate) 08/24/2015  . Palliative care encounter 08/24/2015  . Altered mental status 08/23/2015  . Alcoholic cirrhosis of liver with ascites ([place])   . Arterial hypotension   . Hematuria   . Aspiration pneumonia of right middle lobe ([place])   . Encephalopathy acute   . HCAP (healthcare-associated pneumonia)   . Encephalopathy   . Alcohol withdrawal ([place]) 07/12/2015  . Respiratory failure with hypoxia and hypercapnia ([place]) 07/05/2015  . Abdominal distention   . Alcohol abuse   . Cirrhosis ([place])   . Penile ulcer   . Hepatic encephalopathy ([place]) 07/04/2015  . Acid indigestion 01/05/2015  . Cancer of esophagus ([place]) 01/05/2015  . Gout 01/05/2015  . S/P TIPS (transjugular intrahepatic portosystemic shunt)   . Ascites-treated 06/14/2014  . Abnormal magnetic resonance imaging study 05/16/2012  . Chronic cervical pain 05/16/2012    Orientation RESPIRATION BLADDER Height & Weight     Self  Normal Incontinent, Indwelling catheter (Urinary catheter) Weight: 96.2 kg (212 lb 1.3 oz) Height:  6\' 2"  (188 cm)  BEHAVIORAL SYMPTOMS/MOOD NEUROLOGICAL BOWEL NUTRITION STATUS      Incontinent Diet (Please see  DC summary)  AMBULATORY STATUS COMMUNICATION OF NEEDS Skin   Extensive Assist Verbally Other (Comment) (Wound on leg)                       Personal Care Assistance Level of Assistance  Bathing, Feeding, Dressing Bathing Assistance: Maximum assistance Feeding assistance: Limited assistance Dressing Assistance: Maximum assistance     Functional Limitations Info             SPECIAL CARE FACTORS FREQUENCY  PT (By licensed PT)     PT Frequency: not yet assessed              Contractures      Additional Factors Info  Code Status, Allergies Code Status Info: DNR Allergies Info: Amoxicillin           Current Medications (08/24/2015):  This is the current hospital active medication list Current Facility-Administered Medications  Medication Dose Route Frequency Provider Last Rate Last Dose  . acetaminophen (TYLENOL) tablet 500 mg  500 mg Oral Q6H PRN Mercy Riding, MD       Or  . acetaminophen (TYLENOL) suppository 650 mg  650 mg Rectal Q6H PRN Mercy Riding, MD      . allopurinol (ZYLOPRIM) tablet 300 mg  300 mg Oral Daily Mercy Riding, MD   300 mg at 08/24/15 0934  . antiseptic oral rinse (CPC / CETYLPYRIDINIUM CHLORIDE 0.05%) solution 7 mL  7 mL Mouth Rinse q12n4p Lind Covert, MD   7 mL at 08/24/15 1258  . chlorhexidine ([place])  0.12 % solution 15 mL  15 mL Mouth Rinse BID Lind Covert, MD   15 mL at 08/24/15 0935  . clotrimazole (LOTRIMIN) 1 % cream   Topical BID Ashly M Gottschalk, DO      . dextrose 5 %-0.9 % sodium chloride infusion   Intravenous Continuous Lind Covert, MD 100 mL/hr at 08/24/15 0402    . enoxaparin (LOVENOX) injection 40 mg  40 mg Subcutaneous QHS Mercy Riding, MD   40 mg at 08/23/15 2128  . folic acid (FOLVITE) tablet 1 mg  1 mg Oral Daily Mercy Riding, MD   1 mg at 08/24/15 0935  . ipratropium-albuterol (DUONEB) 0.5-2.5 (3) MG/3ML nebulizer solution 3 mL  3 mL Nebulization BID Lind Covert, MD   3 mL at  08/23/15 1137  . lactulose (CHRONULAC) 10 GM/15ML solution 30 g  30 g Oral QID Mercy Riding, MD   30 g at 08/24/15 1254  . levothyroxine (SYNTHROID, LEVOTHROID) injection 37.5 mcg  37.5 mcg Intravenous Daily Ashly M Gottschalk, DO   37.5 mcg at 08/24/15 1145  . midodrine (PROAMATINE) tablet 5 mg  5 mg Oral TID WC Mercy Riding, MD   5 mg at 08/24/15 1254  . mometasone-formoterol (DULERA) 200-5 MCG/ACT inhaler 2 puff  2 puff Inhalation BID Lind Covert, MD   2 puff at 08/23/15 0030  . RESOURCE THICKENUP CLEAR   Oral PRN Lind Covert, MD      . rifaximin Doreene Nest) tablet 550 mg  550 mg Oral BID Mercy Riding, MD   550 mg at 08/24/15 0934  . thiamine (VITAMIN B-1) tablet 100 mg  100 mg Oral Daily Mercy Riding, MD   100 mg at 08/24/15 [id number]     Discharge Medications: Please see discharge summary for a list of discharge medications.  Relevant Imaging Results:  Relevant Lab Results:   Additional Information SSN [id number]  Benard Halsted, LCSWA

## 2015-08-24 NOTE — Evaluation (Signed)
Clinical/Bedside Swallow Evaluation Patient Details  Name: [name] MRN: [id number] Date of Birth: Aug 26, 1945  Today's Date: 08/24/2015 Time: SLP Start Time (ACUTE ONLY): 1000 SLP Stop Time (ACUTE ONLY): 1016 SLP Time Calculation (min) (ACUTE ONLY): 16 min  Past Medical History:  Past Medical History  Diagnosis Date  . Hypertension     under control  . History of cancer chemotherapy   . Hx of radiation therapy 2002  . Gout     under control  . Umbilical hernia   . Fatty liver     "under control, being monitored"  . Shortness of breath dyspnea   . GERD (gastroesophageal reflux disease)   . Alcoholic cirrhosis ([place])   . 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 Sleep apnea     "will not get tested" (08/04/2015)  . Arthritis     "neck" (08/04/2015)   Past Surgical History:  Past Surgical History  Procedure Laterality Date  . Esophagectomy  2002    "partial"  . Incisional hernia repair  2004  . Umbilical hernia repair N/A 06/14/2014    Procedure:  OPEN UMBILICAL HERNIA REPAIR;  Surgeon: Pedro Earls, MD;  Location: [facility];  Service: General;  Laterality: N/A;  . Tonsillectomy    . Radiology with anesthesia N/A 02/09/2015    Procedure: TIPS;  Surgeon: Jacqulynn Cadet, MD;  Location: [place];  Service: Radiology;  Laterality: N/A;  . Hernia repair     HPI:  Hrag Litaker is a 70 y.o. male admitted for AMS. 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 On prior admission in 4/17 pt presented with ataxic dysarthria and delayed swallow, MBS recommended dys 1 diet with honey thick liquids. Wife reprots function improved at SNF at pt was upgraded to regular diet and thin liquids prior to d/c home.    Assessment / Plan / Recommendation Clinical Impression  Pt demonstrates similar function as seen during prior admit including slow, weak movement of oral and  likely hyolaryngeal musculature. Swallow response is delayed and there is coughing following sips of thin liquids with suspected aspiration. In his prior MBS pt was observed to have silent aspriation and decreased sensation is still suspected. Pt was able to orally manipulate and swallow honey thick liquids and puree without overt signs of aspiration and this was tolerated well last month. Encouraged pt to swallow twice to clear possible residuals. Recommend pt resume a dys 1 (puree) diet with honey thick liquids. SLP will plan for objective assessment tomorrow to determine ability to consume upgraded textures.     Aspiration Risk  Moderate aspiration risk    Diet Recommendation Dysphagia 1 (Puree);Honey-thick liquid   Liquid Administration via: Cup Medication Administration: Crushed with puree Supervision: Staff to assist with self feeding Compensations: Minimize environmental distractions;Slow rate;Small sips/bites;Multiple dry swallows after each bite/sip Postural Changes: Seated upright at 90 degrees    Other  Recommendations Oral Care Recommendations: Oral care BID Other Recommendations: Have oral suction available;Clarify dietary restrictions   Follow up Recommendations  Skilled Nursing facility    Frequency and Duration min 2x/week  2 weeks       Prognosis Prognosis for Safe Diet Advancement: Good      Swallow Study   General HPI: Deris Postle is a 70 y.o. male admitted for AMS. 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, hx esophageal SCC s/p esophagectomy  and chemotherapy, HTN, gout. On prior admission in 4/17 pt presented with ataxic dysarthria and delayed swallow, MBS recommended dys 1 diet with honey thick liquids. Wife reprots function improved at SNF at pt was upgraded to regular diet and thin liquids prior to d/c home.  Type of Study: Bedside Swallow Evaluation Previous Swallow Assessment: NPO Diet Prior to this Study:  NPO Temperature Spikes Noted: No Respiratory Status: Room air History of Recent Intubation: No Length of Intubations (days): 6 days Date extubated: 07/11/15 Behavior/Cognition: Alert;Cooperative Oral Cavity Assessment: Within Functional Limits Oral Care Completed by SLP: Recent completion by staff Oral Cavity - Dentition: Adequate natural dentition Vision: Functional for self-feeding Self-Feeding Abilities: Needs assist Patient Positioning: Upright in bed Baseline Vocal Quality:  (snoring respirations) Volitional Cough: Strong Volitional Swallow: Able to elicit    Oral/Motor/Sensory Function Overall Oral Motor/Sensory Function: Generalized oral weakness (slow motor movement)   Ice Chips Ice chips: Impaired Pharyngeal Phase Impairments: Wet Vocal Quality   Thin Liquid Thin Liquid: Impaired Presentation: Cup Pharyngeal  Phase Impairments: Suspected delayed Swallow;Cough - Immediate    Nectar Thick Nectar Thick Liquid: Not tested   Honey Thick Honey Thick Liquid: Impaired Presentation: Cup Pharyngeal Phase Impairments: Suspected delayed Swallow   Puree Puree: Impaired Presentation: Spoon Pharyngeal Phase Impairments: Suspected delayed Swallow   Solid   GO   Solid: Not tested       Herbie Baltimore, MA CCC-SLP [phone]  Cayli Escajeda, Katherene Ponto 08/24/2015,10:22 AM

## 2015-08-24 NOTE — Progress Notes (Signed)
Family Medicine Teaching Service Daily Progress Note Intern Pager: [phone]  Patient name: Sklyer Bage Medical record number: [id number] Date of birth: Sep 05, 1945 Age: 70 y.o. Gender: male  Primary Care Provider: Lujean Amel, MD Consultants: RT, palliative Code Status: DNR/DNI  Pt Overview and Major Events to Date:  Acute encephalopathy, Unstable protection of airway, AMS  Assessment and Plan: Jamichael Taj is a 70 y.o. male admitted for AMS. 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, hx esophageal SCC s/p esophagectomy and chemotherapy, HTN, gout  AMS: acute hepatic encephalopathy w/or without myxedema crisis. Ammonia elevated to 123 in the setting of decreased BMs. On admission, oriented only to self. TSH >90000 w/ fT4 0.14 on 5/9.  Mental status MUCH improved today; AOx4 after BM. -Continue Lactulose 30 mg q6h titrated (OK to give PO now) to 3 BMs daily / Mental status  -Wife to meet with palliative today  -Speech eval:  Dys 1 w/ honey thick liquids -Rifaximin 550 mg twice a day -Continue folate and thiamine -Monitor mental status -SW consult. Patient from SNF -Monitor for signs of infection. Obtain CXR and start empiric abx to cover aspiration PNA if develops fevers, increased HR, SOB. -If patient becomes combative overnight do not administer Ativan or Haldol. Consider restrain.   Hypothyroidism: New onset. TSH >90,000 w/ fT4 of 0.14 on 08/23/15. -TSH of >90000 with freeT4 of 0.14 yesterday -Started Levothyroxine 38 mcg IV -Repeat TSH in 6 weeks  -Consider consult endo  Macrocytic anemia: Acute on chronic, unstable. Attributable to cirrhosis and chronic vitamin deficiencies. Maybe acute blood loss contributing with epistaxis. FOBT neg.  - Hb trending down 10.3 (4/21)->8.5->9.4-> 8.8--> 8.1 (5/10) - Consider tfx if <7 or asymptomatic. Discuss goals of care with wife in terms of both workup and further treatment for now unstable anemia.  -  Anemia panel ordered  Wounds/sores/ skin: eschar and dry skin in legs and foot. Discoloration on lower back/sacral area.  - Wound care consulted for foot/leg and sacral area - Clotrimazole added for rash on buttocks, could consider adding steroid cream if no improvement  CKD-3A: no significant AKI. serum creatinine Pending. Baseline ~1.3-1.6. Patient with history of urinary retention and indwelling foley.  -Repeat of voiding trial when able -D5NS@100  -Urology follow up as outpatient. -I&Os: 500 cc out today   -Monitor daily BMP  History of Hypotension: normotensive this admission -will continue home midodrine  Snoring/?Sleep apnea: followed by outside pulmonology Dr. Corrie Dandy. Plan was to schedule outpatient sleep study during his hospitalization in 04/2015 - Dulera 200/5 twice a day  - Duonebs twice a day  - CPAP at night  Gout: Chronic, controlled on allopurinol 300 mg daily at home.  -Continue home allopurinol  S/p esophagectomy: on Omeprazole at home - protonix  Lower extremity wounds: No infection, with background xerosis. Last dressing changed 5/6, with recommendations for q3d changes.  - Replace dressing 5/9, defer WOC consult.  FEN/GI: -D5NS at 100 -NPO per SLP eval -Protonix as above  Prophylaxis: Lovenox   Disposition: SNF when able  Subjective:  Much better today. Oriented to self, place & time. Able to name wife. Following commands. Denies any pain.   Objective: Temp:  [97.3 F (36.3 C)-98.1 F (36.7 C)] 97.3 F (36.3 C) (05/10 0458) Pulse Rate:  [73-92] 73 (05/10 0458) Resp:  [18-32] 18 (05/10 0458) BP: (117-150)/(69-99) 124/69 mmHg (05/10 0458) SpO2:  [98 %-100 %] 100 % (05/10 0458)   Physical Exam:  Gen: Gazed/glaring eyes but following commands  HEENT: macroglossia, hemostatic lesion on lateral aspect of tongue, MMM Cardio: RRR Pulm: rhonchi throughout (though seems to be transmitted from upper airways) GI: protuberant abdomen, NT, +BS GU:  foley catheter in place Ext: LLE with skin break down, left ankle with what appears to be eschar formation Skin: as above, bilateral buttocks with erythematous maculopapular rash that is blanching and does not appear to involve the inguinal folds MSK: ?frozen shoulder on left side, +limited ROM Neuro: Strength 4/5 on upper extremities bilaterally. Orientated. Following commands.    Laboratory:  Recent Labs Lab 09/09/2015 2038 08/23/15 0601 08/23/15 1056  WBC 5.4 5.3  --   HGB 8.5* 9.4* 8.8*  HCT 23.9* 27.5* 25.1*  PLT 177 176  --     Recent Labs Lab 08/24/2015 2038 08/23/15 0601  NA 134* 136  K 3.9 3.8  CL 100* 102  CO2 21* 21*  BUN 13 13  CREATININE 1.62* 1.40*  CALCIUM 9.6 9.0  PROT 6.2* 5.9*  BILITOT 1.3* 1.1  ALKPHOS 186* 181*  ALT 31 31  AST 69* 77*  GLUCOSE 107* 124*   Ammonia 123 TSH 9000 fT4 0.14  Imaging/Diagnostic Tests: Dg Chest Port 1 View  08/23/2015  CLINICAL DATA:  Vomiting during sleep. EXAM: PORTABLE CHEST 1 VIEW COMPARISON:  08/07/2015 and 08/04/2015 FINDINGS: Lungs are somewhat hypoinflated with mild right infrahilar opacification and left retrocardiac opacification as cannot exclude atelectasis or infection. Stable cardiomegaly. Calcified plaque over the thoracic aorta. Remainder of the exam is unchanged. IMPRESSION: Mild right infrahilar and left retrocardiac opacification which may be due to atelectasis or infection. Stable cardiomegaly. Electronically Signed   By: Marin Olp M.D.   On: 08/23/2015 09:39    Hershal Coria, Med Student 08/24/2015, 7:21 AM MS4, [place] Intern pager: [phone], text pages welcome   I have separately seen and examined the patient. I have discussed the findings and exam with Student Dr Rosalia Hammers and agree with the above note.  My changes/additions are outlined in BLUE.   Paizlie Klaus M. Lajuana Ripple, DO PGY-2, [place]

## 2015-08-24 NOTE — Consult Note (Signed)
Consultation Note Date: 08/24/2015   Patient Name: [name]  DOB: [birth year]  MRN: [id number]  Age / Sex: 70 y.o., male  PCP: Lujean Amel, MD Referring Physician: Lind Covert, MD  Reason for Consultation: Establishing goals of care and Psychosocial/spiritual support  HPI/Patient Profile: 70 y.o. 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 Progressively difficult to manage his ascites. Initially responded diuretics and then he developed borderline hepatorenal syndrome. 6 or 8 months ago after several close calls with hepatorenal syndrome, s/p TIPS procedure which resulted in much better profusion of his kidneys and improved marked improvement in his creatinine.    Unfortunately, the patient continued to drink after this and has had progressive decline in his liver function and develop chronic hepatic encephalopathy.   At times he has been lucid and other times completely confused despite the use of lactulose.  Continued physical decline , multiple readmission over the past six months.  Most recently has been at SNF for unsuccessful attempts at rehabilitation   Family faced with advanced directive decisions and anticipatory care needs.    Clinical Assessment and Goals of Care:    This NP Wadie Lessen reviewed medical records, received report from team, assessed the patient and then meet at the patient's bedside along with his wife  to discuss diagnosis, prognosis, GOC, EOL wishes disposition and options.   A detailed discussion was had today regarding advanced directives.  Concepts specific to code status, artifical feeding and hydration, continued IV antibiotics and rehospitalization was had.  The difference between a aggressive medical intervention path  and  a palliative comfort care path for this patient at this time was had.  Values and goals of care important to patient and family were attempted to be elicited.  Concept of Hospice and Palliative Care were discussed  Natural trajectory and expectations at EOL were discussed.  Questions and concerns addressed.  Hard Choices booklet left for review. Family encouraged to call with questions or concerns.  PMT will continue to support holistically.  MOST form introduced   SUMMARY OF RECOMMENDATIONS    - At this time wife is contemplating advanced care decision.  She understands the overall poor prognosis but it is hard for her to make a complete shift to comfort care.  She plans to talk with her sons tonight., and talk with me again in the morning  Code Status/Advance Care Planning:  DNR    Palliative Prophylaxis:   Aspiration, Bowel Regimen, Delirium Protocol and Oral Care  Psycho-social/Spiritual:   Desire for further Chaplaincy support:no  Additional Recommendations: Education on Hospice  Prognosis:   < 6 weeks  Discharge Planning: To Be Determined      Primary Diagnoses: Present on Admission:  . Hepatic encephalopathy ([place]) . Altered mental status . Gout . Arterial hypotension  I have reviewed the medical record, interviewed the patient and family, and examined the patient.  The following aspects are pertinent.  Past Medical History  Diagnosis Date  . Hypertension     under control  . History of cancer chemotherapy   . Hx of radiation therapy 2002  . Gout     under control  . Umbilical hernia   . Fatty liver     "under control, being monitored"  . Shortness of breath dyspnea   . GERD (gastroesophageal reflux disease)   . Alcoholic cirrhosis ([place])   . 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 Sleep apnea     "will not get tested" (08/04/2015)  . Arthritis     "neck" (08/04/2015)   Social History   Social History  . Marital Status:  Married    Spouse Name: N/A  . Number of Children: N/A  . Years of Education: N/A   Social History Main Topics  . Smoking status: Former Smoker -- 1.50 packs/day for 40 years    Types: Cigarettes    Quit date: 04/16/2004  . Smokeless tobacco: Never Used  . Alcohol Use: 50.4 oz/week    84 Glasses of wine per week     Comment: 08/04/2015 "@ least 2 bottles of wire/day"  . Drug Use: No  . Sexual Activity: No   Other Topics Concern  . None   Social History Narrative   Family History  Problem Relation Age of Onset  . Diabetes Father   . Diabetes Brother    Scheduled Meds: . allopurinol  300 mg Oral Daily  . antiseptic oral rinse  7 mL Mouth Rinse q12n4p  . chlorhexidine  15 mL Mouth Rinse BID  . clotrimazole   Topical BID  . enoxaparin (LOVENOX) injection  40 mg Subcutaneous QHS  . folic acid  1 mg Oral Daily  . ipratropium-albuterol  3 mL Nebulization BID  . lactulose  30 g Oral QID   Or  . lactulose  300 mL Rectal QID  . levothyroxine  25 mcg Oral QAC breakfast  . midodrine  5 mg Oral TID WC  . mometasone-formoterol  2 puff Inhalation BID  . rifaximin  550 mg Oral BID  . thiamine  100 mg Oral Daily   Continuous Infusions: . dextrose 5 % and 0.9% NaCl 100 mL/hr at 08/24/15 0402   PRN Meds:.acetaminophen **OR** acetaminophen Medications Prior to Admission:  Prior to Admission medications   Medication Sig Start Date End Date Taking? Authorizing Provider  allopurinol (ZYLOPRIM) 300 MG tablet Take 300 mg by mouth daily.  12/20/12  Yes Historical Provider, MD  budesonide-formoterol (SYMBICORT) 160-4.5 MCG/ACT inhaler Inhale 2 puffs into the lungs 2 (two) times daily.   Yes Historical Provider, MD  ferrous sulfate 325 (65 FE) MG EC tablet Take 325 mg by mouth daily.   Yes Historical Provider, MD  folic acid (FOLVITE) 1 MG tablet Take 1 tablet (1 mg total) by mouth daily. 07/22/15  Yes Mercy Riding, MD  ipratropium-albuterol (DUONEB) 0.5-2.5 (3) MG/3ML SOLN Take 3 mLs by  nebulization 2 (two) times daily. 07/22/15  Yes Mercy Riding, MD  lactulose (CHRONULAC) 10 GM/15ML solution Take 45 mLs (30 g total) by mouth 4 (four) times daily. Titrate for 3-4 bowel movements a day. 08/09/15  Yes Mercy Riding, MD  midodrine (PROAMATINE) 5 MG tablet Take 1 tablet (5 mg total) by mouth 3 (three) times daily with meals. 07/22/15  Yes Mercy Riding, MD  nystatin cream (MYCOSTATIN) Apply cream twice daily to buttocks for fungal  rash.   Yes Historical Provider, MD  omeprazole (PRILOSEC) 20 MG capsule Take 20 mg by mouth daily.  01/18/13  Yes Historical Provider, MD  OXYGEN Inhale 2 L into the lungs.   Yes Historical Provider, MD  rifaximin (XIFAXAN) 550 MG TABS tablet Take 1 tablet (550 mg total) by mouth 2 (two) times daily. 08/09/15  Yes Mercy Riding, MD  thiamine 100 MG tablet Take 1 tablet (100 mg total) by mouth daily. 07/22/15  Yes Mercy Riding, MD   Allergies  Allergen Reactions  . Amoxicillin Diarrhea   Review of Systems  Unable to perform ROS: Mental status change    Physical Exam  Constitutional: He appears well-developed. He appears lethargic. He appears ill.  -confused  Cardiovascular: Normal rate, regular rhythm and normal heart sounds.   Pulmonary/Chest: He has decreased breath sounds in the right lower field and the left lower field.  - loud sonorous breathing  Abdominal: He exhibits distension.  Neurological: He appears lethargic.  Skin: Skin is warm and dry.  -jaundice noted    Vital Signs: BP 124/69 mmHg  Pulse 73  Temp(Src) 97.3 F (36.3 C) (Oral)  Resp 18  Ht 6\' 2"  (1.88 m)  Wt 96.2 kg (212 lb 1.3 oz)  BMI 27.22 kg/m2  SpO2 100% Pain Assessment: 0-10 (sound asleep and snoring)   Pain Score: Asleep   SpO2: SpO2: 100 % O2 Device:SpO2: 100 % O2 Flow Rate: .O2 Flow Rate (L/min): 2 L/min  IO: Intake/output summary: No intake or output data in the 24 hours ending 08/24/15 0908  LBM: Last BM Date: 08/23/15 Baseline Weight: Weight: 96.2 kg (212 lb  1.3 oz) Most recent weight: Weight: 96.2 kg (212 lb 1.3 oz)      Palliative Assessment/Data:20 %  currently   Discussed with Family Medicine resident  Time In: 1030 Time Out: 1145 Time Total: 75 min Greater than 50%  of this time was spent counseling and coordinating care related to the above assessment and plan.  Signed by: Wadie Lessen, NP   Please contact Palliative Medicine Team phone at [phone] for questions and concerns.  For individual provider: See Shea Evans

## 2015-08-25 DIAGNOSIS — T17998D Other foreign object in respiratory tract, part unspecified causing other injury, subsequent encounter: Secondary | ICD-10-CM

## 2015-08-25 DIAGNOSIS — T17908A Unspecified foreign body in respiratory tract, part unspecified causing other injury, initial encounter: Secondary | ICD-10-CM | POA: Insufficient documentation

## 2015-08-25 DIAGNOSIS — Z66 Do not resuscitate: Secondary | ICD-10-CM

## 2015-08-25 LAB — CBC
HCT: 24.4 % — ABNORMAL LOW (ref 39.0–52.0)
Hemoglobin: 8.5 g/dL — ABNORMAL LOW (ref 13.0–17.0)
MCH: 35.6 pg — ABNORMAL HIGH (ref 26.0–34.0)
MCHC: 34.8 g/dL (ref 30.0–36.0)
MCV: 102.1 fL — AB (ref 78.0–100.0)
PLATELETS: 167 10*3/uL (ref 150–400)
RBC: 2.39 MIL/uL — ABNORMAL LOW (ref 4.22–5.81)
RDW: 17 % — AB (ref 11.5–15.5)
WBC: 5.6 10*3/uL (ref 4.0–10.5)

## 2015-08-25 LAB — BASIC METABOLIC PANEL
Anion gap: 12 (ref 5–15)
BUN: 9 mg/dL (ref 6–20)
CHLORIDE: 108 mmol/L (ref 101–111)
CO2: 20 mmol/L — ABNORMAL LOW (ref 22–32)
CREATININE: 1.32 mg/dL — AB (ref 0.61–1.24)
Calcium: 8.6 mg/dL — ABNORMAL LOW (ref 8.9–10.3)
GFR calc Af Amer: >60 mL/min (ref >60)
GFR calc non Af Amer: 52 mL/min — ABNORMAL LOW (ref >60)
GLUCOSE: 113 mg/dL — AB (ref 65–99)
Potassium: 3.6 mmol/L (ref 3.5–5.1)
SODIUM: 140 mmol/L (ref 135–145)

## 2015-08-25 LAB — PROTIME-INR
INR: 1.15 (ref 0.00–1.49)
Prothrombin Time: 14.9 seconds (ref 11.6–15.2)

## 2015-08-25 MED ORDER — PROTAMINE SULFATE 10 MG/ML IV SOLN
40.0000 mg | Freq: Once | INTRAVENOUS | Status: AC
  Administered 2015-08-25: 40 mg via INTRAVENOUS
  Filled 2015-08-25: qty 4

## 2015-08-25 MED ORDER — OXYMETAZOLINE HCL 0.05 % NA SOLN
4.0000 | NASAL | Status: AC
  Administered 2015-08-25: 4 via NASAL
  Filled 2015-08-25: qty 15

## 2015-08-25 MED ORDER — LACTULOSE ENEMA
300.0000 mL | Freq: Four times a day (QID) | RECTAL | Status: DC
Start: 2015-08-25 — End: 2015-08-26
  Administered 2015-08-25 – 2015-08-26 (×2): 300 mL via RECTAL
  Filled 2015-08-25 (×5): qty 300

## 2015-08-25 MED ORDER — EPINEPHRINE HCL (NASAL) 0.1 % NA SOLN: 1.0000 [drp] | Freq: Once | NASAL | Status: DC

## 2015-08-25 NOTE — Care Management Important Message (Signed)
Important Message  Patient Details  Name: [name] MRN: [id number] Date of Birth: 1945/09/30   Medicare Important Message Given:  Yes    Marlyne Totaro Abena 08/25/2015, 1:15 PM

## 2015-08-25 NOTE — Progress Notes (Signed)
FPTS Interim Progress Note  S:Went to see the patient in response to page from his nurse about his nasal bleeding. When I arrived, patient with brisk bleeding from his nose. He also had blood coming from his mouth. His nurse was at bedside suctioning and cleaning. His oxygen saturation was in 77%. He was snoring and confused. His nurse states that she saw a patient with his face against the bed frame on the left when she first walked in to his room. She he could have hit his head against the bed frame.   O: BP 171/86 mmHg  Pulse 93  Temp(Src) 98.2 F (36.8 C) (Oral)  Resp 20  Ht 6\' 2"  (1.88 m)  Wt 212 lb 1.3 oz (96.2 kg)  BMI 27.22 kg/m2  SpO2 92%   Nose: with brick blood, left nostril>right Mouth: enlarged tongue, covered with blood. No notable source of bleeding  A/P: Brisk Nasal Bleeding: patient HDS. Bleeding likely from unwitnessed trauma of his nose. Could have HIT from Lovenox as well. Unlikely to be variceal given more bleeding from his nose than mouth. He is actually hypertensive to 170's/80's.  -Removed blood clot from his nose using 4x4 gauze -Gave him numerous Afrin nasal spray in to his both nostrils -Started on Oxygen 4L. With this, his saturation improved to 92% -Discontinued Lovenox. Started SCD -Gave protamine 40 mg IV once -Talked to ENT (Dr. Erik Obey) over the phone for further recommendation. He suggested calling if patient continues to bleed. - After all the above measures, bleeding finally stopped.  -Ordered stat CBC  -consider stopping midodrine if his blood pressure continues to be elevated.     Mercy Riding, MD 08/25/2015, 6:20 AM PGY-1, [place] Medicine Service pager [phone]

## 2015-08-25 NOTE — Progress Notes (Signed)
FPTS Interim Progress Note  S:Patient is slightly more arousable this afternoon.  Denies pain. Keeps saying "please" but unable to articulate what he'd like. When asked what he needs, he states "nothing"   O: BP 152/82 mmHg  Pulse 88  Temp(Src) 98.2 F (36.8 C) (Oral)  Resp 20  Ht 6\' 2"  (1.88 m)  Wt 96.2 kg (212 lb 1.3 oz)  BMI 27.22 kg/m2  SpO2 99%   Oriented to person. Able to follow simple commands as "open eyes. Open your mouth" Not oriented to place time or situation  A/P: -Wife at bedside. She states that she has spoken with her sons and that they are in the same page about Mr. Scharpf' condition, but she did not elaborate on what that page is. She states she is meeting with palliative on Monday. She said that she cancelled his room at the nursing room so she's not paying for it while he isn't using it but that they assured her they'd have a room for him when he's d/c'ed.  -No bowel movement today in the setting of decreased responsiveness: administer lactulose enema - Will plan to schedule enemas and hold oral lactulose at this time.   Hershal Coria, Med Student 08/25/2015, 4:12 PM MS4, [place] [facility] Service pager [phone]   I have separately seen and examined the patient. I have discussed the findings and exam with Student Dr Rosalia Hammers and agree with the above note.  My changes/additions are outlined in BLUE.   Alijah Hyde M. Lajuana Ripple, DO PGY-2, [place]

## 2015-08-25 NOTE — Progress Notes (Signed)
Speech Language Pathology Treatment: Dysphagia  Patient Details Name: [name] MRN: [id number] DOB: [birth year] Today's Date: 08/25/2015 Time: 1000-1010 SLP Time Calculation (min) (ACUTE ONLY): 10 min  Assessment / Plan / Recommendation Clinical Impression  Pt demonstrates decreased arousal and awareness today. Vocal quality is wet at baseline, suspicious for probable silent aspiration of bloody secretions. His speech is more dysarthric and oral manipulation of puree and honey thick liquids is very poor with anterior spillage and holding. Swallow is more delayed and wet vocal quality persists after swallow. Pt would have poor participation in objective swallow eval. Recommend pt remain NPO except for meds crushed in puree or thickened. RN present for session and aware, discussed with MD.    HPI HPI: [name] is a 70 y.o. male admitted for AMS. 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 On prior admission in 4/17 pt presented with ataxic dysarthria and delayed swallow, MBS recommended dys 1 diet with honey thick liquids. Wife reprots function improved at SNF at pt was upgraded to regular diet and thin liquids prior to d/c home.       SLP Plan  Continue with current plan of care     Recommendations  Diet recommendations: NPO Medication Administration: Crushed with puree             Oral Care Recommendations: Oral care QID Follow up Recommendations: Skilled Nursing facility Plan: Continue with current plan of care     [place] Leanne Sisler, MA CCC-SLP [id number]  Lynann Beaver 08/25/2015, 10:25 AM

## 2015-08-25 NOTE — Progress Notes (Signed)
Approximately 0445 AM pt bed alarm was going off Kayla, RN went into pt room and saw pt was bleeding from nose.  Vladimir Faster, RN (primary nurse) came into room and assisted Lonn Georgia, Therapist, sports. AxO to self and confusion- baseline this admission. It seemed as though the bleeding subsided but when pt was turned over to right sider blood began to drip down profusely from the right nostril. Pt was laying on his side near the side rail and it seems as though pt may have hit nose on rail. Sat pt upright and the bleeding continued from both nostrils and mouth. Moderate sized Blood clots were removed from pt mouth and nose. RN continued to hold pressure to nares and suction pt. Family medicine was paged and Cyndia Skeeters, MD returned phone call and came to bedside to see pt. Bleeding continued despite pressure being held and suctioning. 02 sats were 77% on RA pt placed on 4L, BP 156/81, 90 pulse. Cyndia Skeeters, MD ordered Afrin spray, protamine, STAT CBC. This was given to pt and the bleeding slowed down. When pt would put head down the bleeding would resume but not as bad as it was. Frequent monitoring and Vital signs are being obtained q15. Bleeding has slowed down greatly. Will continue to monitor pt.

## 2015-08-25 NOTE — Progress Notes (Signed)
Placed patient on CPAP for the night via auto-mode with minimum pressure set at 6cm and maximum pressure set at 20cm. Oxygen set at 2lpm  

## 2015-08-25 NOTE — Progress Notes (Signed)
[facility] PROGRESS NOTE Subjective patient more alert today questionable from treatment of hepatic encephalopathy or hypothyroidism or both.  Objective: Vital signs in last 24 hours: Temp:  [97.4 F (36.3 C)-98.2 F (36.8 C)] 98.2 F (36.8 C) (05/10 2206) Pulse Rate:  [84-93] 88 (05/11 0615) Resp:  [20] 20 (05/10 1414) BP: (99-173)/(54-103) 173/89 mmHg (05/11 0615) SpO2:  [87 %-100 %] 96 % (05/11 0615) Last BM Date: 08/24/15  Intake/Output from previous day: 05/10 0701 - 05/11 0700 In: 380 [P.O.:380] Out: 1550 [Urine:1550] Intake/Output this shift:    PE: General-- marked snoring but does wake up. Has blood on his mouth  Abdomen-- no significant societies  Lab Results:  Recent Labs  08/17/2015 2038 08/23/15 0601 08/23/15 1056 08/24/15 1147 08/24/15 1830 08/25/15 0548  WBC 5.4 5.3  --   --   --  5.6  HGB 8.5* 9.4* 8.8* 8.1* 8.3* 8.5*  HCT 23.9* 27.5* 25.1* 23.8* 24.3* 24.4*  PLT 177 176  --   --   --  167   BMET  Recent Labs  09/13/2015 2038 08/23/15 0601 08/24/15 1147  NA 134* 136 141  K 3.9 3.8 3.2*  CL 100* 102 110  CO2 21* 21* 20*  CREATININE 1.62* 1.40* 1.33*   LFT  Recent Labs  08/31/2015 2038 08/23/15 0601  PROT 6.2* 5.9*  AST 69* 77*  ALT 31 31  ALKPHOS 186* 181*  BILITOT 1.3* 1.1   PT/INR  Recent Labs  08/18/2015 2038  LABPROT 14.2  INR 1.08   PANCREAS No results for input(s): LIPASE in the last 72 hours.       Studies/Results: Dg Chest Port 1 View  08/23/2015  CLINICAL DATA:  Vomiting during sleep. EXAM: PORTABLE CHEST 1 VIEW COMPARISON:  08/07/2015 and 08/04/2015 FINDINGS: Lungs are somewhat hypoinflated with mild right infrahilar opacification and left retrocardiac opacification as cannot exclude atelectasis or infection. Stable cardiomegaly. Calcified plaque over the thoracic aorta. Remainder of the exam is unchanged. IMPRESSION: Mild right infrahilar and left retrocardiac opacification which may be due to  atelectasis or infection. Stable cardiomegaly. Electronically Signed   By: Marin Olp M.D.   On: 08/23/2015 09:39    Medications: I have reviewed the patient's current medications.  Assessment/Plan: 1. Encephalopathy. Probably due to a combination hepatic encephalopathy and severe hypothyroidism. He seems to be getting better. Would continue with rifaximin and lactulose. Our service will check him on Saturday. Terease Marcotte JR,Mackinley Kiehn L 08/25/2015, 7:12 AM  This note was created using voice recognition software. Minor errors may Have occurred unintentionally.  Pager: [phone] If no answer or after hours call [phone]

## 2015-08-25 NOTE — Progress Notes (Signed)
Family Medicine Teaching Service Daily Progress Note Intern Pager: [phone]  Patient name: [name] Medical record number: [id number] Date of birth: 11/19/1945 Age: 70 y.o. Gender: male  Primary Care Provider: Lujean Amel, MD Consultants: RT, palliative Code Status: DNR/DNI  Pt Overview and Major Events to Date:  Acute encephalopathy, Unstable protection of airway, AMS  Assessment and Plan: Raywood Axon is a 70 y.o. male admitted for AMS. 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 Ammonia elevated to 123 in the setting of decreased BMs. On admission, oriented only to self. TSH >90000 w/ fT4 0.14 on 5/9.  Mental status markedly decreased today. Intermittently able to follow commands or speak coherently.  -Continue Lactulose 30 mg PO q6h. Add enemas PRN  titrated to 3 BMs daily / Mental status -Speech eval:  NPO except for meds with thickened purree -Rifaximin 550 mg twice a day -Continue folate and thiamine -Monitor mental status -If patient becomes combative overnight do not administer Ativan or Haldol. Consider restrain.  -Wife consulted with palliative yesterday and will re-meet today to establish further goals of care after discussing with sons last night.  -Potential SNF on Monday, continuing synthroid for assessment of patient's baseline  Hypothyroidism: New onset. TSH >90,000 w/ fT4 of 0.14 on 08/23/15. -TSH of >90000 with freeT4 of 0.14 yesterday -Started Levothyroxine 38 mcg IV QD (5/10>), will continue this for next few days then transition to PO -Repeat TSH in 6 weeks  -Consider consult endo  Macrocytic anemia: Acute on chronic, unstable. Attributable to cirrhosis and chronic vitamin deficiencies. Maybe acute blood loss contributing with epistaxis. FOBT neg.  -Hb trending down  10.3 (4/21)->8.5->9.4-> 8.8--> 8.1--> 8.5 -Consider tfx if <7 or asymptomatic. Discuss goals of care with wife in terms of both workup and further treatment for now unstable anemia.  -Anemia panel ordered  Epistaxis. Overnight nose and mouth bleed. Traumatic nose bleed vs. tongue biting vs. Bleeding diathesis. HIT very unlikely give than platelets have not dropped by 50%.  -Resolved with affrin and promethamine and w/o pressure 2/2 to maintenance of airway -ENT called andsuggested we call again if bleeding continued -Platelets 167. PT 14.9; INR 1.15 -Lovenox dc'd, SCDs placed  Wounds/sores/ skin: eschar and dry skin in legs and foot. Discoloration on lower back/sacral area.  - Wound care consulted for foot/leg and sacral area - Clotrimazole added for rash on buttocks, could consider adding steroid cream if no improvement  CKD-3A: no significant AKI. serum creatinine Pending. Baseline ~1.3-1.6. Patient with history of urinary retention and indwelling foley.  -Repeat of voiding trial when able -D5NS@100  -Urology follow up as outpatient. -I&Os: 500 cc out today   -Monitor daily BMP  History of Hypotension: BP 148/83 ths am -Patient has been hypertensive this admission.  -D/C midodrine -Monitor BP  Snoring/?Sleep apnea: followed by outside pulmonology Dr. Corrie Dandy. Plan was to schedule outpatient sleep study during his hospitalization in 04/2015 - Dulera 200/5 twice a day  - Duonebs twice a day  - CPAP at night  Gout: Chronic, controlled on allopurinol 300 mg daily at home.  -Continue home allopurinol  S/p esophagectomy: on Omeprazole at home - protonix  Lower extremity wounds: No infection, with background xerosis. Last dressing changed 5/6, with recommendations for q3d changes.  - Replace dressing 5/9, defer WOC consult.  FEN/GI: -D5NS at 100 -NPO per SLP eval -Protonix as above  Prophylaxis: Lovenox held in the setting of prolonged nosebleed last evening. SCDs  placed  Disposition: Will await Palliative conversation this afternoon but suspect will dc to SNF when able  Subjective:  Much better today. Oriented to self, place & time. Able to name wife. Following commands. Denies any pain.   Objective: Temp:  [97.4 F (36.3 C)-98.2 F (36.8 C)] 98.2 F (36.8 C) (05/10 2206) Pulse Rate:  [84-93] 88 (05/11 0615) Resp:  [20] 20 (05/10 1414) BP: (99-173)/(54-103) 173/89 mmHg (05/11 0615) SpO2:  [87 %-100 %] 96 % (05/11 0615)   Physical Exam:  Gen: Barely arousable for exam.  HEENT: macroglossia, hemostatic lesion on lateral aspect of tongue, MMM. Dry blood all around lips.  Pulm: rhonchi throughout (though seems to be transmitted from upper airways) GU: foley catheter in place Ext: LLE with skin break down, left ankle with what appears to be eschar formation Skin: as above, bilateral buttocks with erythematous maculopapular rash that is blanching and does not appear to involve the inguinal folds Neuro: Non alert and not oriented. Intermittently following commands. Able follow commands to open mouth and respond that not in pain. Non-oriented to self, place, time or situation.    Laboratory:  Recent Labs Lab 09/06/2015 2038 08/23/15 0601  08/24/15 1147 08/24/15 1830 08/25/15 0548  WBC 5.4 5.3  --   --   --  5.6  HGB 8.5* 9.4*  < > 8.1* 8.3* 8.5*  HCT 23.9* 27.5*  < > 23.8* 24.3* 24.4*  PLT 177 176  --   --   --  167  < > = values in this interval not displayed.  Recent Labs Lab 09/13/2015 2038 08/23/15 0601 08/24/15 1147 08/25/15 0608  NA 134* 136 141 140  K 3.9 3.8 3.2* 3.6  CL 100* 102 110 108  CO2 21* 21* 20* 20*  BUN 13 13 12 9   CREATININE 1.62* 1.40* 1.33* 1.32*  CALCIUM 9.6 9.0 8.1* 8.6*  PROT 6.2* 5.9*  --   --   BILITOT 1.3* 1.1  --   --   ALKPHOS 186* 181*  --   --   ALT 31 31  --   --   AST 69* 77*  --   --   GLUCOSE 107* 124* 276* 113*    Imaging/Diagnostic Tests: No results found.  Hershal Coria, Med  Student 08/25/2015, 8:20 AM MS4, [place] Intern pager: [phone], text pages welcome   I have separately seen and examined the patient. I have discussed the findings and exam with Student Dr Rosalia Hammers and agree with the above note.  My changes/additions are outlined in BLUE.   Lamari Youngers M. Lajuana Ripple, DO PGY-2, [place]

## 2015-08-25 NOTE — Progress Notes (Signed)
Daily Progress Note   Patient Name: [name]       Date: 08/25/2015 DOB: Nov 18, 1945  Age: 70 y.o. MRN#: [id number] Attending Physician: Lind Covert, MD Primary Care Physician: Lujean Amel, MD Admit Date: 08/18/2015  Reason for Consultation/Follow-up: Establishing goals of care and Psychosocial/spiritual support  Subjective: - continued conversation regarding diagnosis, prognosis, GOC and treatment options  -shared  with wife conversation with Family Medicine Dr Andria Frames;  plan is continue to treat the treatable, ( encephalopathy and hypothyroidism) and hope for improvement, watchful waiting.  Wife remains hopeful for improvement too and possible return to SNF for rehabilitation and ultimately home  Length of Stay: 3  Current Medications: Scheduled Meds:  . allopurinol  300 mg Oral Daily  . antiseptic oral rinse  7 mL Mouth Rinse q12n4p  . chlorhexidine  15 mL Mouth Rinse BID  . clotrimazole   Topical BID  . folic acid  1 mg Oral Daily  . ipratropium-albuterol  3 mL Nebulization BID  . lactulose  30 g Oral QID  . levothyroxine  37.5 mcg Intravenous Daily  . mometasone-formoterol  2 puff Inhalation BID  . rifaximin  550 mg Oral BID  . thiamine  100 mg Oral Daily    Continuous Infusions: . dextrose 5 % and 0.9% NaCl 100 mL/hr at 08/25/15 0109    PRN Meds: acetaminophen **OR** acetaminophen, RESOURCE THICKENUP CLEAR  Physical Exam  Constitutional: He appears lethargic. He appears ill.  Cardiovascular: Normal rate, regular rhythm and normal heart sounds.   Pulmonary/Chest: He has decreased breath sounds in the right lower field and the left lower field.  -sonorous breathing  Abdominal: He exhibits distension.  Neurological: He appears lethargic.  Skin: Skin is warm  and dry.  -jaunice            Vital Signs: BP 148/83 mmHg  Pulse 88  Temp(Src) 98.2 F (36.8 C) (Oral)  Resp 20  Ht 6\' 2"  (1.88 m)  Wt 96.2 kg (212 lb 1.3 oz)  BMI 27.22 kg/m2  SpO2 96% SpO2: SpO2: 96 % O2 Device: O2 Device: Nasal Cannula O2 Flow Rate: O2 Flow Rate (L/min): 4 L/min  Intake/output summary:  Intake/Output Summary (Last 24 hours) at 08/25/15 1125 Last data filed at 08/25/15 0631  Gross per 24 hour  Intake    140 ml  Output   1550 ml  Net  -1410 ml   LBM: Last BM Date: 08/24/15 Baseline Weight: Weight: 96.2 kg (212 lb 1.3 oz) Most recent weight: Weight: 96.2 kg (212 lb 1.3 oz)       Palliative Assessment/Data: 20% at best    Flowsheet Rows        Most Recent Value   Intake Tab    Referral Department  -- [family medicine]   Unit at Time of Referral  Med/Surg Unit   Palliative Care Primary Diagnosis  Cancer   Date Notified  08/23/15   Palliative Care Type  New Palliative care   Reason for referral  Clarify Goals of Care   Date of Admission  09/09/2015   Date first seen by Palliative Care  08/24/15   # of days Palliative referral response time  1 Day(s)   # of days IP prior to Palliative referral  1   Clinical Assessment    Psychosocial & Spiritual Assessment    Palliative Care Outcomes       Patient Active Problem List   Diagnosis Date Noted  . DNR (do not resuscitate) 08/24/2015  . Palliative care encounter 08/24/2015  . Other specified hypothyroidism   . 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 Altered mental status 08/23/2015  . Alcoholic cirrhosis of liver with ascites ([place])   . Arterial hypotension   . Hematuria   . Aspiration pneumonia of right middle lobe ([place])   . Encephalopathy acute   . HCAP (healthcare-associated pneumonia)   . Encephalopathy   . Alcohol withdrawal ([place]) 07/12/2015  . Respiratory failure with hypoxia and hypercapnia ([place]) 07/05/2015  . Abdominal distention   . Alcohol abuse   . Cirrhosis ([place])   . Penile ulcer   . Hepatic  encephalopathy ([place]) 07/04/2015  . Acid indigestion 01/05/2015  . Cancer of esophagus ([place]) 01/05/2015  . Gout 01/05/2015  . S/P TIPS (transjugular intrahepatic portosystemic shunt)   . Ascites-treated 06/14/2014  . Abnormal magnetic resonance imaging study 05/16/2012  . Chronic cervical pain 05/16/2012    Palliative Care Assessment & Plan   Patient Profile: -70 y.o. 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 Progressively difficult to manage his ascites. Initially responded diuretics and then he developed borderline hepatorenal syndrome. 6 or 8 months ago after several close calls with hepatorenal syndrome, s/p TIPS procedure which resulted in much better profusion of his kidneys and improved marked improvement in his creatinine.   Unfortunately, the patient continued to drink after this and has had progressive decline in his liver function and develop chronic hepatic encephalopathy.  At times he has been lucid and other times completely confused despite the use of lactulose.  Drastic cognitive decline at SNF, admitted with severe hypothyroidism.    Assessment/Recommendations/Plan:   - watchful waiting, if patient improves from hypothyroid stand point, may cognitively improve and return to SNF  - continue current treatment plan, hopeful for improvement  -this NP has scheduled meeting with wife on Monday at 1230   Family faced with advanced directive decisions and anticipatory care needs    Code Status:   DNR/DNI      Code Status Orders        Start     Ordered   08/23/15 0003  Do not attempt resuscitation (DNR)   Continuous    Question Answer Comment  In the event of  cardiac or respiratory ARREST Do not call a "code blue"   In the event of cardiac or respiratory ARREST Do not perform Intubation, CPR, defibrillation or ACLS     In the event of cardiac or respiratory ARREST Use medication by any route, position, wound care, and other measures to relive pain and suffering. May use oxygen, suction and manual treatment of airway obstruction as needed for comfort.      08/23/15 0002    Code Status History    Date Active Date Inactive Code Status Order ID Comments User Context   08/04/2015 10:48 AM 08/10/2015  7:57 PM DNR [id number]  Leone Brand, MD Inpatient   07/18/2015  9:49 AM 07/22/2015  7:32 PM DNR [id number]  Erick Colace, NP Inpatient   07/08/2015  1:44 PM 07/18/2015  9:49 AM Partial Code [id number]  Rush Farmer, MD Inpatient   07/05/2015  8:12 PM 07/08/2015  1:44 PM Full Code [id number]  Rush Landmark, MD Inpatient   07/04/2015  3:48 PM 07/05/2015  8:12 PM Full Code [id number]  Rogue Bussing, MD ED   02/10/2015  1:08 AM 02/10/2015  4:38 PM Full Code [id number]  Jacqulynn Cadet, MD Inpatient   06/14/2014  1:21 PM 06/15/2014 12:31 PM Full Code [id number]  Pedro Earls, MD Inpatient    Advance Directive Documentation        Most Recent Value   Type of Advance Directive  Out of facility DNR (pink MOST or yellow form)   Pre-existing out of facility DNR order (yellow form or pink MOST form)     "MOST" Form in Place?         Prognosis:   Unable to determine  Discharge Planning:  To Be Determined  Care plan was discussed with Dr Andria Frames  Thank you for allowing the Palliative Medicine Team to assist in the care of this patient.   Time In: 0900 Time Out: 0935 Total Time 35 min Prolonged Time Billed  no       Greater than 50%  of this time was spent counseling and coordinating care related to the above assessment and plan.  Wadie Lessen, NP  Please contact Palliative Medicine Team phone at [phone] for questions and concerns.

## 2015-09-15 NOTE — Progress Notes (Signed)
At approximately 4:31 am pt was found unresponsive, pulseless and not breathing. Pt is DNR. Vladimir Faster, RN and [place], RN felt for pulse and listened for a heart beat, there was none. Family Medicine MD paged. [place], DO returned call and came to bedside. Death certificate signed and [place] Doner called. Wife notified and will be coming up to the hospital.

## 2015-09-15 NOTE — Discharge Summary (Signed)
[facility] Discharge Summary  Patient name: [name] Medical record number: [id number] Date of birth: Sep 21, 1945 Age: 70 y.o. 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 male that presented with increasing confusion. 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 Electrolytes were stable and BUN and glucose were within normal range.  Vitals were stable.  He was placed on scheduled Lactulose and Rifaximin.  On day of admission, patient was noted to have maroon colored vomitus.  He was noted to have a lesion on his tongue, which was thought to be the culprit. His hgb was monitored closely and remained stable around 8.1-8.5.   Because of his AMS, there was concern for possible aspiration.  A chest xray was obtained that showed Mild right infrahilar and left retrocardiac opacification which may be due to atelectasis or infection.  He was monitored for fevers and vital sign changes consistent with infection.  He did not exhibit  these, so antibiotics were not started at that time.  His mental status improved significantly by hospital day #3; he was alert and oriented x4.  He was transitioned back to oral lactulose.   Patient was also noted to have macroglossia, TSH was obtained and was markedly elevated.  Myxedema coma was consider amongst the DDx for his altered mental status.  However, because his mentation had improved with Lactulose, he was not loaded with Synthroid but was titrated up to 37.32mcg IV Synthroid.  He received 4 doses of Levothyroxine this hospitalization.  By hospital day #4 patient's mental status started to decline.  He was only able to intermittently respond to questions and follow simple commands.  For this reason, lactulose enemas were resumed.  A Palliative care meeting was held with patient's wife.  The conversation at that time was hopeful.  However, patient's wife voiced good insight into her husband's disease process and prognosis.  DNR was confirmed both on admission and during the meeting with palliative care.  On the morning of hospital day #5 patient's RN called to inform me that patient was found pulseless.  Last known well was 03:02 am, as this was when he had vitals checked.  His BP at that time was 153/86, HR 77, O2 98% on 2L [place].  RN notes that patient had somehow removed his O2, as when he was found to be deceased, he was not wearing it properly.  Estimated time of death was 04:31am.  I personally attempted to contact wife with news of her husband's passing but was unable to reach her after 4 tries.  Patient's RN was ultimately able to contact her.  She  asked that [place] Donor be contacted, as patient wanted to donate his organs.  It is difficult to determine exactly what the cause of patient's death was, especially in the setting of previously normal vitals, but I suspect that patient had a respiratory arrest in the setting of hepatic encephalopathy.    Death certificate was completed and  provided to Amaryllis Dyke RN, who made arrangements for transport of patient's remains.  Significant Procedures: none  Significant Labs and Imaging:   Recent Labs Lab 09/07/2015 2038 08/23/15 0601  08/24/15 1147 08/24/15 1830 08/25/15 0548  WBC 5.4 5.3  --   --   --  5.6  HGB 8.5* 9.4*  < > 8.1* 8.3* 8.5*  HCT 23.9* 27.5*  < > 23.8* 24.3* 24.4*  PLT 177 176  --   --   --  167  < > = values in this interval not displayed.  Recent Labs Lab 08/25/2015 2038 08/23/15 0601 08/24/15 1147 08/25/15 0608  NA 134* 136 141 140  K 3.9 3.8 3.2* 3.6  CL 100* 102 110 108  CO2 21* 21* 20* 20*  GLUCOSE 107* 124* 276* 113*  BUN 13 13 12 9   CREATININE 1.62* 1.40* 1.33* 1.32*  CALCIUM 9.6 9.0 8.1* 8.6*  ALKPHOS 186* 181*  --   --   AST 69* 77*  --   --   ALT 31 31  --   --   ALBUMIN 2.9* 2.8*  --   --    Dg Chest Port 1 View  08/23/2015  CLINICAL DATA:  Vomiting during sleep. EXAM: PORTABLE CHEST 1 VIEW COMPARISON:  08/07/2015 and 08/04/2015 FINDINGS: Lungs are somewhat hypoinflated with mild right infrahilar opacification and left retrocardiac opacification as cannot exclude atelectasis or infection. Stable cardiomegaly. Calcified plaque over the thoracic aorta. Remainder of the exam is unchanged. IMPRESSION: Mild right infrahilar and left retrocardiac opacification which may be due to atelectasis or infection. Stable cardiomegaly. Electronically Signed   By: Marin Olp M.D.   On: 08/23/2015 09:39   Janora Norlander, DO Sep 10, 2015, 5:13 AM PGY-2, [place]

## 2015-09-15 NOTE — Progress Notes (Signed)
FPTS Interim Progress Note  S:Paged by RN to report that patient had passed this morning.  Suspect time of death was around 4am.  I attempted to call patient's wife, [name], at [phone] and [phone].  There was, unfortunately, no answer at either number after 2 attempts on each.  A voicemail was left on [phone] to contact the hospital at [phone].  Will attempt to reach wife again in about 30 minutes.  O: HR: 0, RR: 0 Gen: cold, mouth agape, not breathing Cardio: no palpable pulses, no heartbeat  Pulm: no respirations  A/P: [name] is a 70 y.o. male that presented for what appeared to be hepatic encephalopathy with marked hypothyroidism.  Please see death note for detailed hospital course - Will attempt to reach wife again - death certificate filled out and returned to patient's RN.  Janora Norlander, DO 09-10-2015, 4:50 AM PGY-2, [facility] pager [phone]

## 2015-09-15 DEATH — deceased

## 2015-11-15 NOTE — ED Provider Notes (Signed)
Medical screening examination/treatment/procedure(s) were conducted as a shared visit with non-physician practitioner(s) and myself.  I personally evaluated the patient during the encounter.  Agree with plan for admission.   EKG Interpretation   Date/Time:  Monday Aug 22 2015 20:24:51 EDT Ventricular Rate:  91 PR Interval:  144 QRS Duration: 104 QT Interval:  429 QTC Calculation: 528 R Axis:   -19 Text Interpretation:  Sinus rhythm Borderline left axis deviation Low  voltage, precordial leads Prolonged QT interval No significant change was  found Confirmed by CAMPOS  MD, Lennette Bihari ([id number]) on 08/23/2015 9:58:38 AM       Isla Pence, MD 11/02/15 1006

## 2016-07-31 IMAGING — US US ART/VEN ABD/PELV/SCROTUM DOPPLER LTD
1 series · 13 of 25 positions shown · non-contrast
Comparison: Prior TIPS ultrasound 03/29/2015

CLINICAL DATA: 70-year-old male with cirrhosis status post prior
TIPS creation for medically refractory ascites. Patient currently
hospitalized with acute altered mental status. Evaluate for TIPS
occlusion. The TIPS was placed 02/09/2015

EXAM:
DUPLEX ULTRASOUND OF LIVER AND TIPS SHUNT
TECHNIQUE: Color and duplex Doppler ultrasound was performed to evaluate the
hepatic in-flow and out-flow vessels.

[Series 1: us art/ven abd/pelv/scrotum doppler ltd · 0.22mm/px · 13 of 48 slices shown]
[im 1/48]
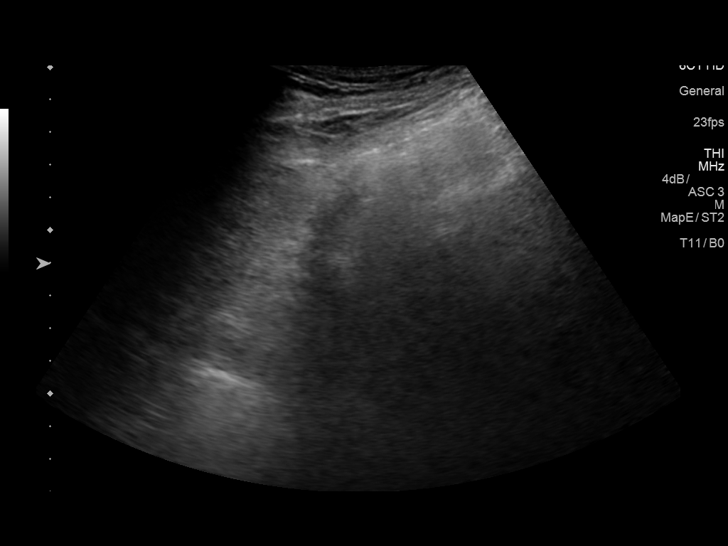
[im 4/48]
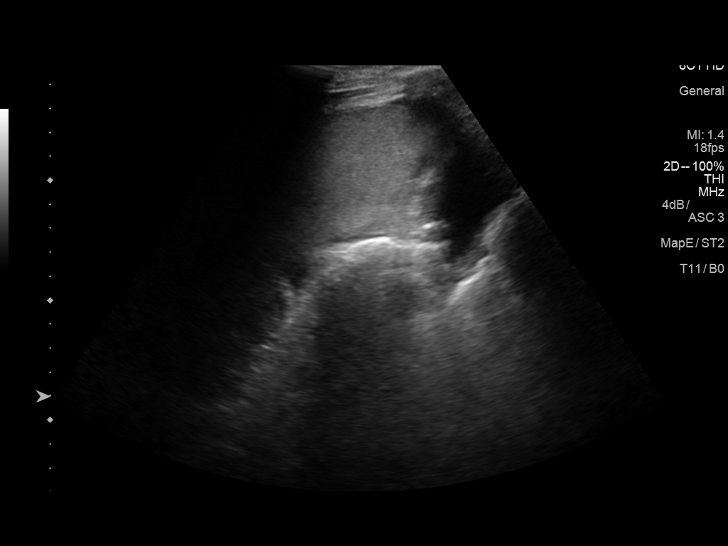
[im 8/48]
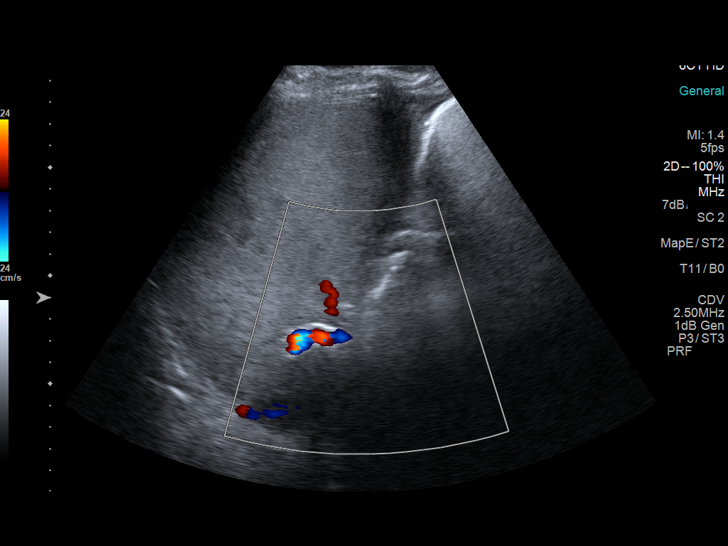
[im 12/48]
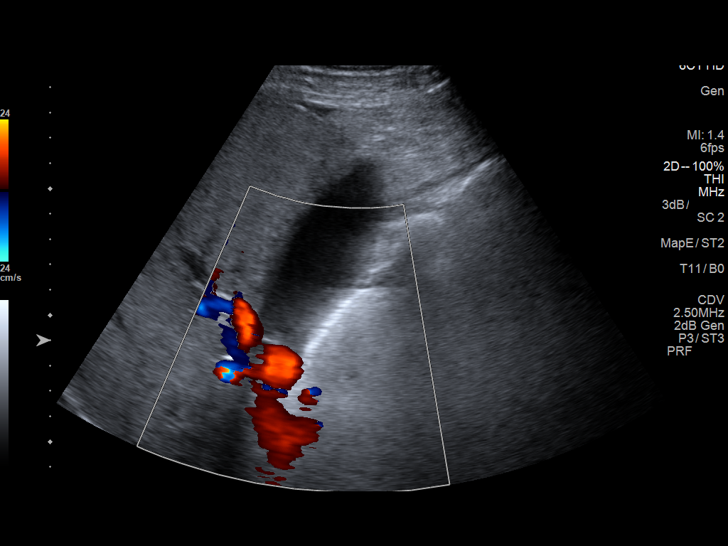
[im 16/48]
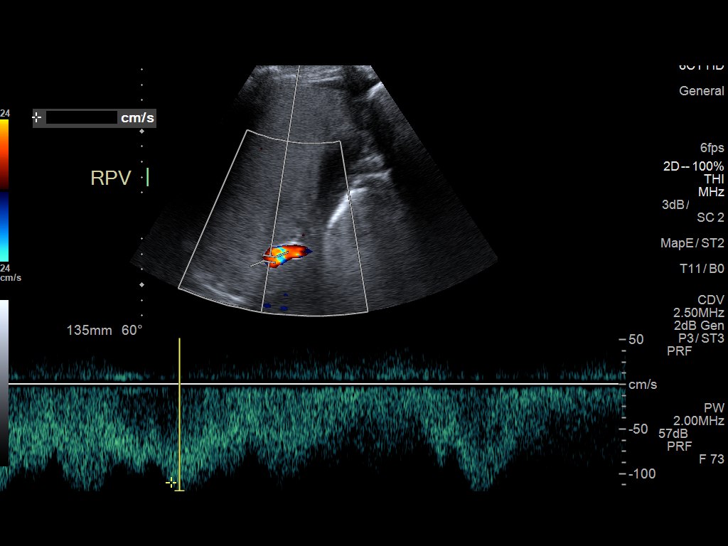
[im 20/48]
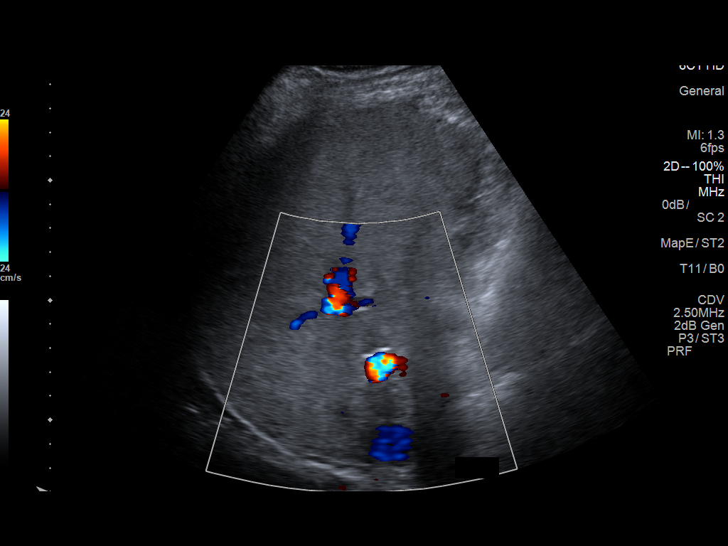
[im 24/48]
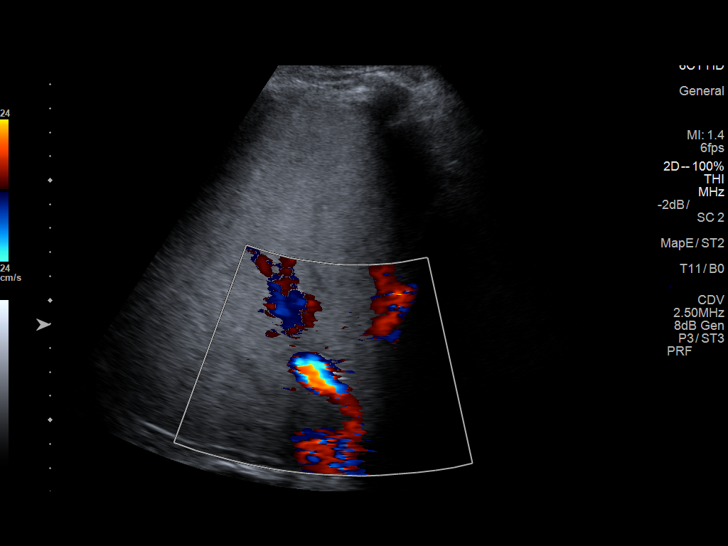
[im 28/48]
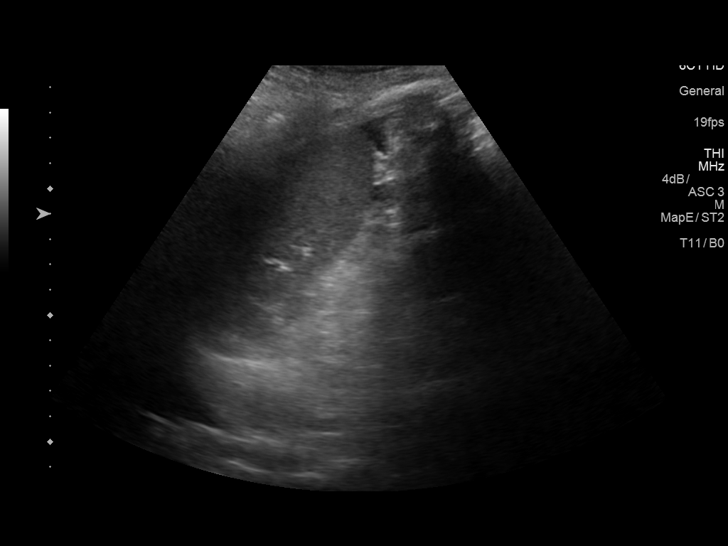
[im 32/48]
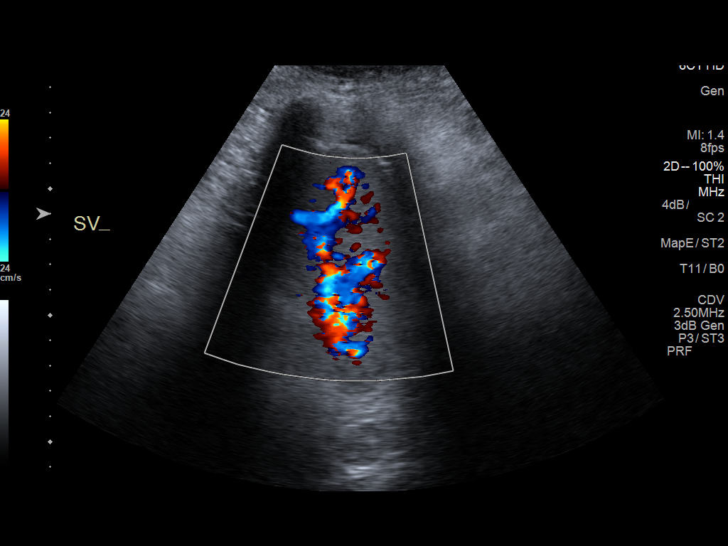
[im 36/48]
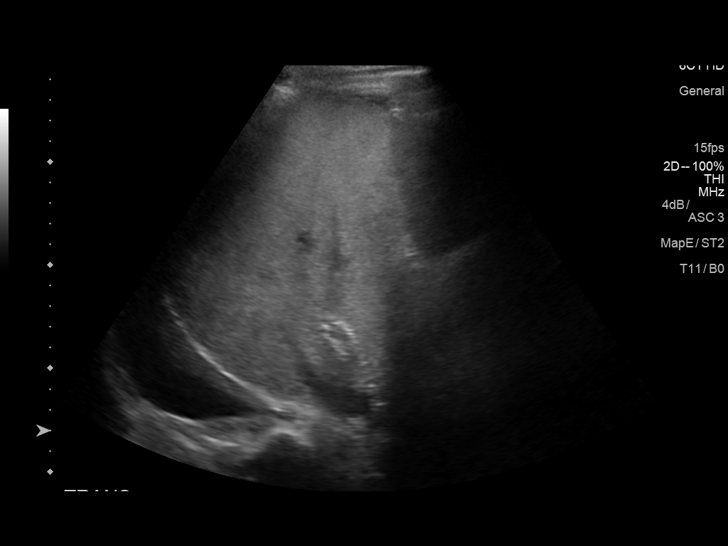
[im 40/48]
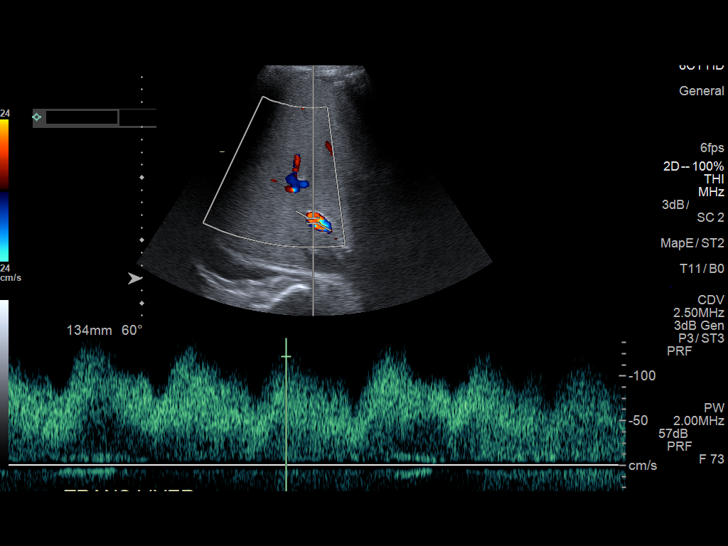
[im 44/48]
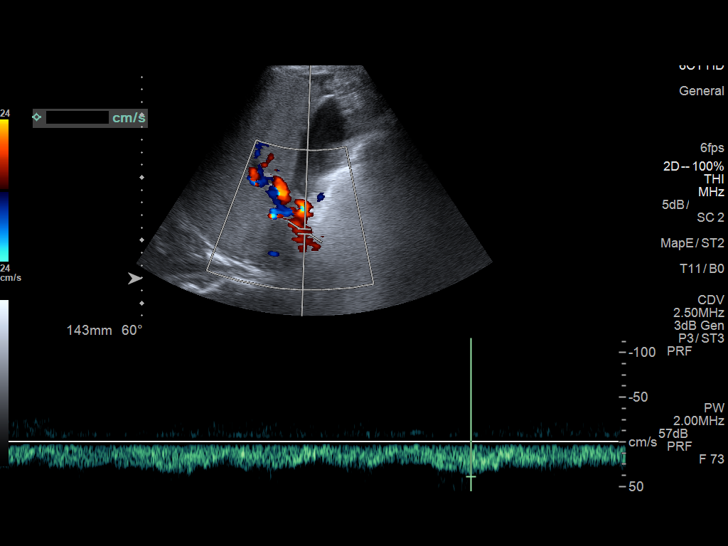
[im 48/48]
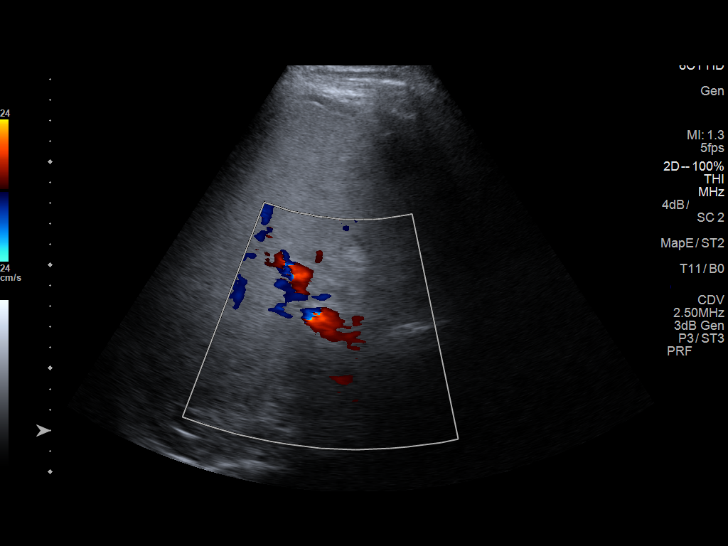

[13 of 25 positions shown; findings below may reference images not displayed]

FINDINGS: TIPS Shunt Velocities

Proximal:  134 cm/sec

Mid:  88

Distal:  119 cm/sec

Portal Vein Velocities

Main:  41 cm/sec with normal hepatopetal directional flow

Right:  68 cm/sec with normal hepatopetal directional flow

Left:  Not well seen

Hepatic Vein Velocities

Right:  Not well seen

Middle:  32 centimeters/second

Left:  Not well seen

Hepatic Artery Velocity:  71 centimeter/second

Splenic Vein Velocity:  42 cm/second

Varices:  None.  Mildly prominent splenic hilar vasculature.

Ascites: Small volume ascites and right-sided pleural effusion.

Other findings: Cirrhotic morphology of the liver. No discrete
lesions.
IMPRESSION: 1. Patent middle hepatic to right portal venous TIPS. No evidence of
stenosis or occlusion.
2. The right and left hepatic veins are not well seen on this study.
3. Trace ascites and small right pleural effusion.

## 2018-02-11 IMAGING — CR DG CHEST 1V PORT
2 series · 2 of 2 positions shown · non-contrast
Comparison: 07/07/2015 and earlier.

CLINICAL DATA: Acute respiratory failure. Current history of
hepatic encephalopathy and cirrhosis. Personal history of esophageal
cancer.

EXAM:
PORTABLE CHEST 1 VIEW

[AP (1 of 2)]
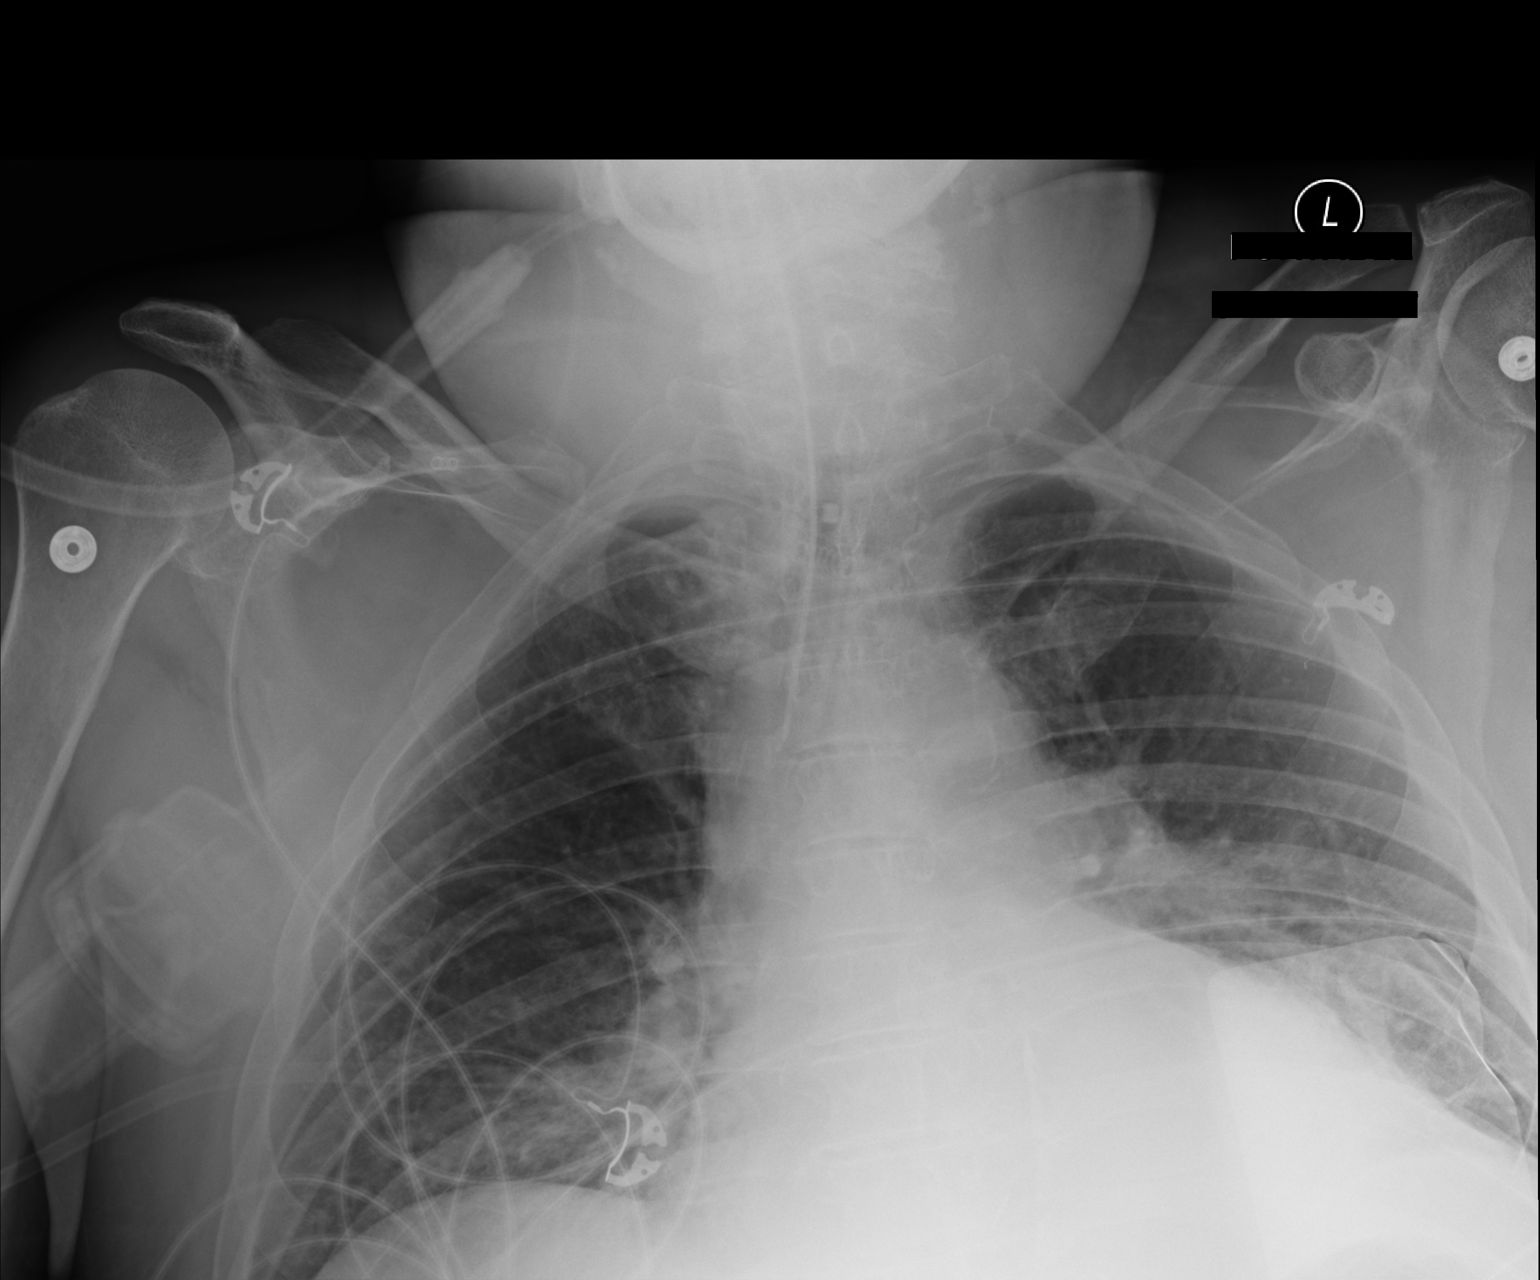

[AP (2 of 2)]
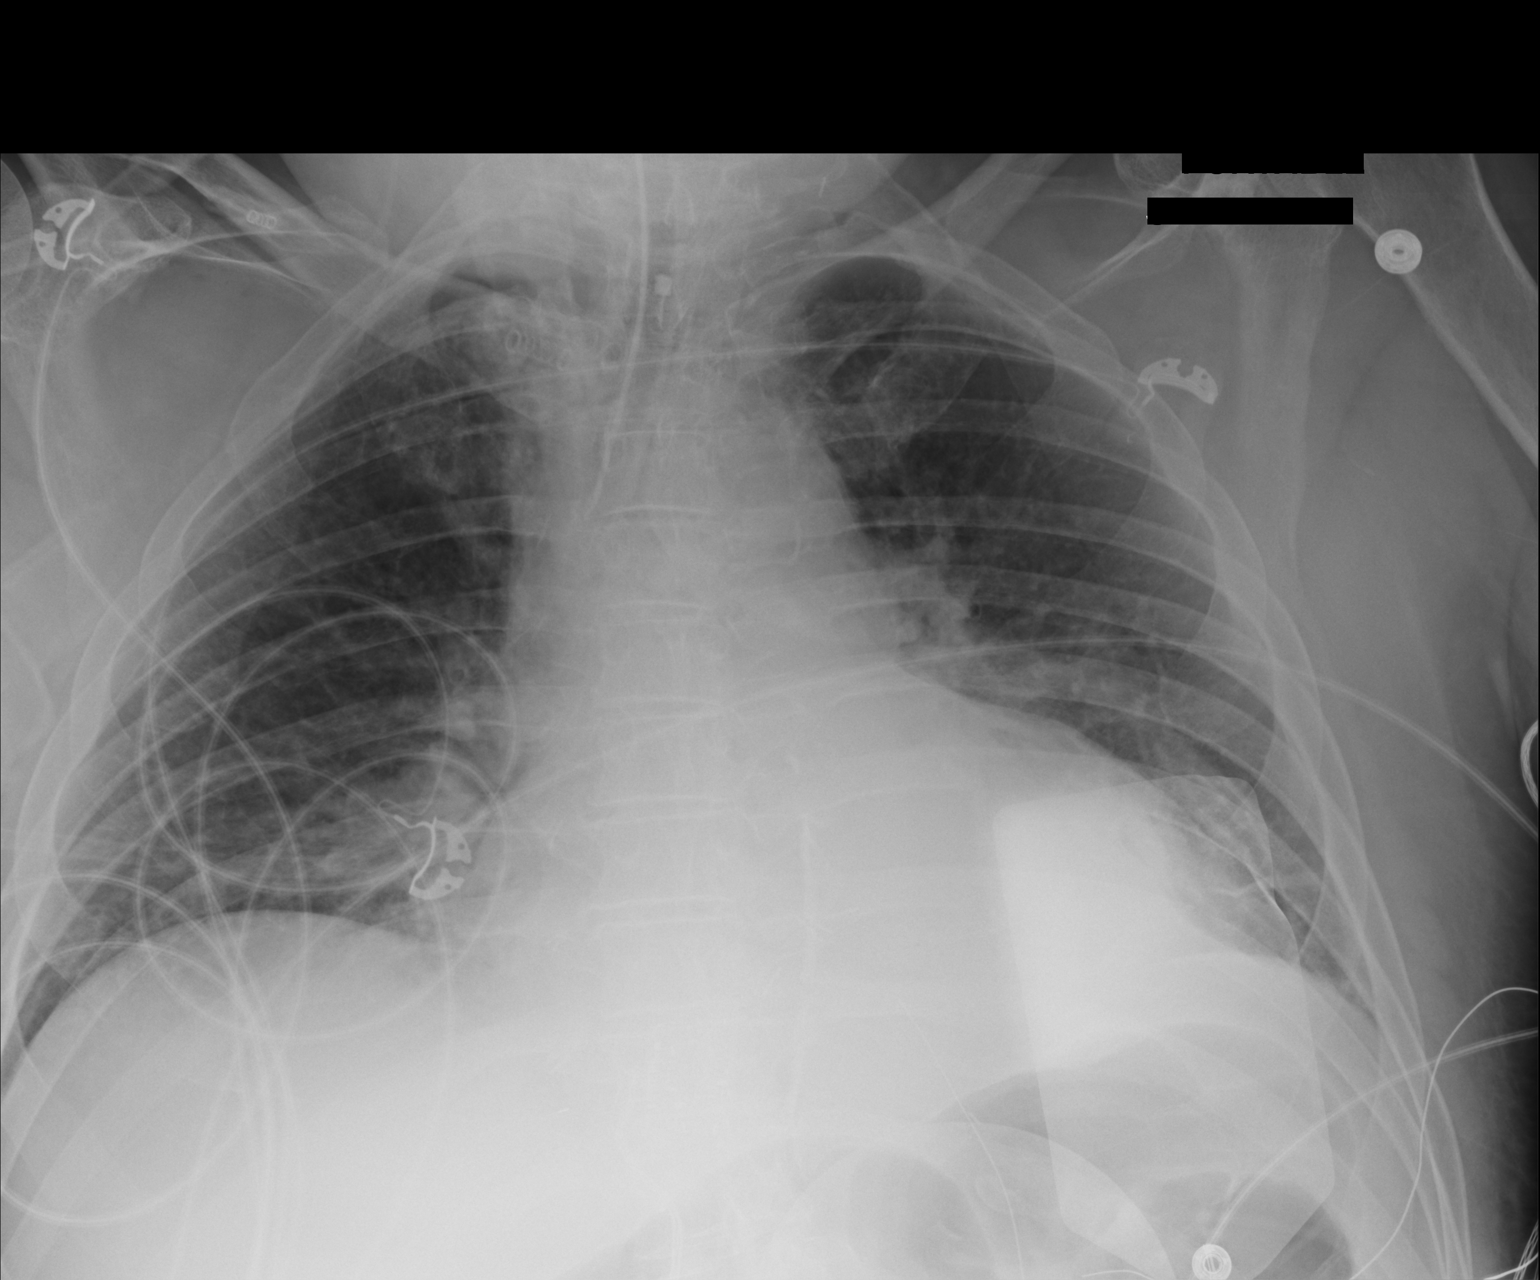

[2 of 2 positions shown; findings below may reference images not displayed]

FINDINGS: Endotracheal tube tip projects approximately 2 cm above the carina.
External pacing pads. Cardiac silhouette markedly enlarged,
unchanged. Stable dense consolidation in the left lower lobe.
Suboptimal inspiration with atelectasis at the right lung base.
Lungs otherwise clear. Normal pulmonary vascularity.
IMPRESSION: 1. Endotracheal tube tip projects approximately 2 cm above the
carina.
2. Stable dense left lower lobe atelectasis and/or pneumonia.
3. Stable cardiomegaly without pulmonary edema.
4. No new abnormalities.

## 2018-02-13 IMAGING — CR DG CHEST 1V PORT
2 series · 2 of 2 positions shown · non-contrast
Comparison: 07/09/2015

CLINICAL DATA: Acute respiratory failure

EXAM:
PORTABLE CHEST 1 VIEW

[AP (1 of 2)]
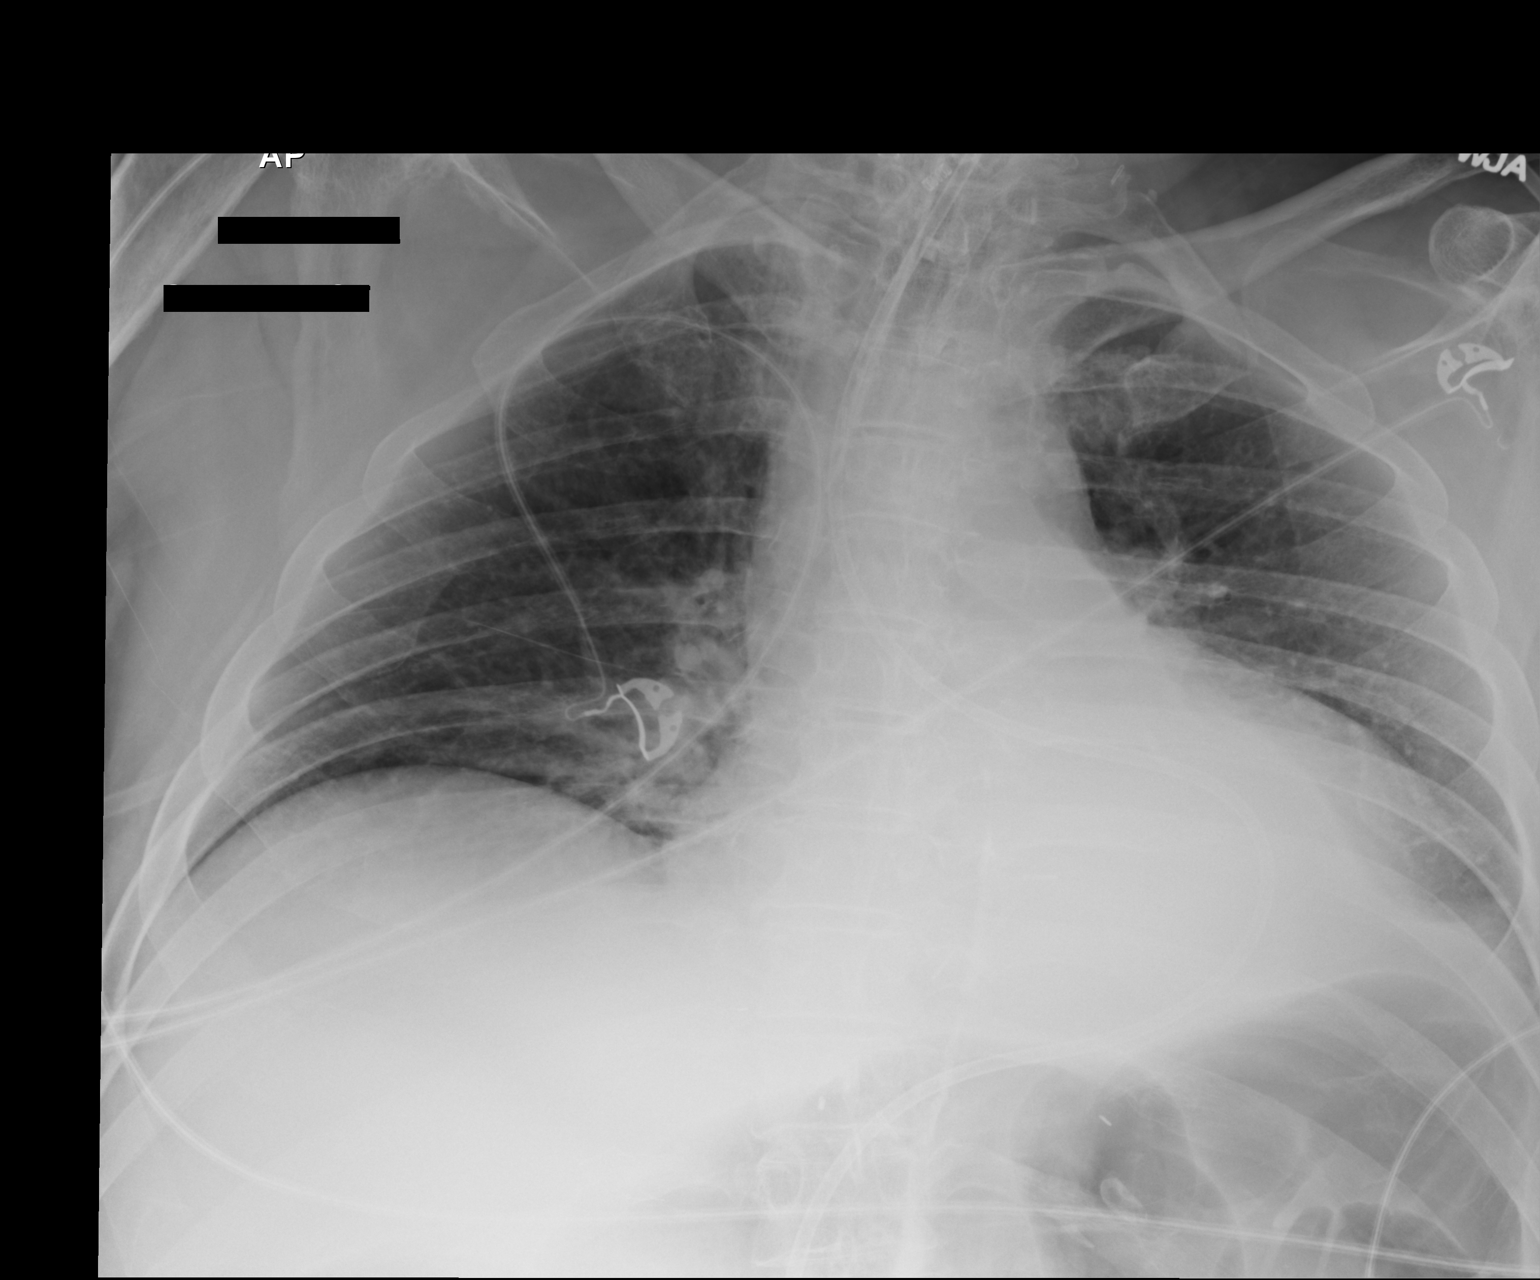

[AP (2 of 2)]
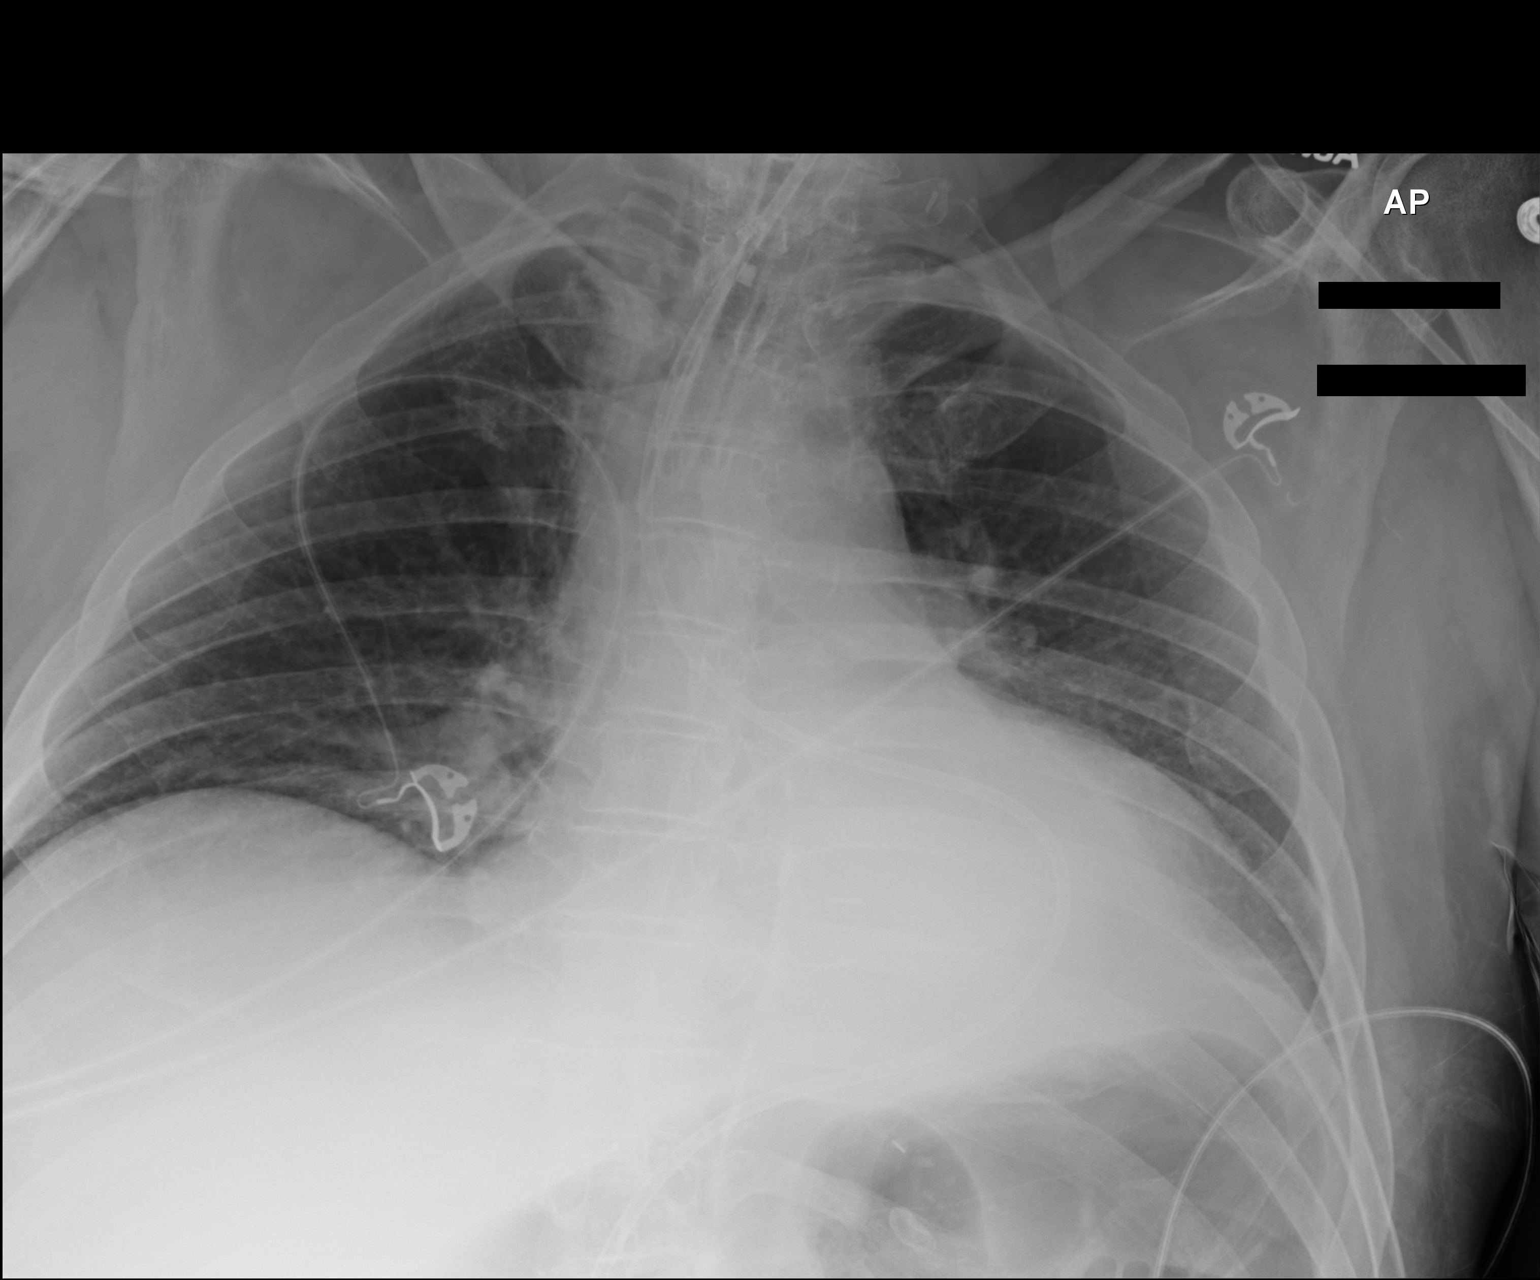

[2 of 2 positions shown; findings below may reference images not displayed]

FINDINGS: Cardiomegaly again noted. Endotracheal and NG tube are unchanged in
position. No pulmonary edema. Mild elevation of the right
hemidiaphragm. Persistent left basilar atelectasis.
IMPRESSION: Stable support apparatus. Mild elevation of the right hemidiaphragm.
Persistent left basilar atelectasis. No pulmonary edema.
